# Patient Record
Sex: Female | Born: 1960
Health system: Southern US, Community
[De-identification: ages and names within clinical notes are randomized; demographics above are authoritative.]

## PROBLEM LIST (undated history)

## (undated) DIAGNOSIS — J189 Pneumonia, unspecified organism: Secondary | ICD-10-CM

## (undated) DIAGNOSIS — N390 Urinary tract infection, site not specified: Secondary | ICD-10-CM

## (undated) DIAGNOSIS — F419 Anxiety disorder, unspecified: Secondary | ICD-10-CM

## (undated) DIAGNOSIS — T8859XA Other complications of anesthesia, initial encounter: Secondary | ICD-10-CM

## (undated) DIAGNOSIS — F32A Depression, unspecified: Secondary | ICD-10-CM

## (undated) DIAGNOSIS — J45909 Unspecified asthma, uncomplicated: Secondary | ICD-10-CM

## (undated) DIAGNOSIS — K219 Gastro-esophageal reflux disease without esophagitis: Secondary | ICD-10-CM

## (undated) DIAGNOSIS — F329 Major depressive disorder, single episode, unspecified: Secondary | ICD-10-CM

## (undated) DIAGNOSIS — T4145XA Adverse effect of unspecified anesthetic, initial encounter: Secondary | ICD-10-CM

## (undated) DIAGNOSIS — K649 Unspecified hemorrhoids: Secondary | ICD-10-CM

## (undated) DIAGNOSIS — R011 Cardiac murmur, unspecified: Secondary | ICD-10-CM

## (undated) HISTORY — DX: Urinary tract infection, site not specified: N39.0

## (undated) HISTORY — DX: Unspecified asthma, uncomplicated: J45.909

## (undated) HISTORY — DX: Gastro-esophageal reflux disease without esophagitis: K21.9

## (undated) HISTORY — PX: WISDOM TOOTH EXTRACTION: SHX21

## (undated) HISTORY — DX: Cardiac murmur, unspecified: R01.1

## (undated) HISTORY — DX: Unspecified hemorrhoids: K64.9

## (undated) HISTORY — DX: Major depressive disorder, single episode, unspecified: F32.9

## (undated) HISTORY — DX: Depression, unspecified: F32.A

## (undated) HISTORY — PX: FRACTURE SURGERY: SHX138

## (undated) HISTORY — DX: Pneumonia, unspecified organism: J18.9

## (undated) HISTORY — DX: Anxiety disorder, unspecified: F41.9

---

## 1997-11-08 ENCOUNTER — Other Ambulatory Visit: Admission: RE | Admit: 1997-11-08 | Discharge: 1997-11-08 | Payer: Self-pay | Admitting: Obstetrics and Gynecology

## 1999-02-18 ENCOUNTER — Other Ambulatory Visit: Admission: RE | Admit: 1999-02-18 | Discharge: 1999-02-18 | Payer: Self-pay | Admitting: Obstetrics and Gynecology

## 2003-11-13 ENCOUNTER — Other Ambulatory Visit: Admission: RE | Admit: 2003-11-13 | Discharge: 2003-11-13 | Payer: Self-pay | Admitting: Internal Medicine

## 2005-05-20 ENCOUNTER — Other Ambulatory Visit: Admission: RE | Admit: 2005-05-20 | Discharge: 2005-05-20 | Payer: Self-pay | Admitting: Internal Medicine

## 2005-06-13 ENCOUNTER — Encounter: Admission: RE | Admit: 2005-06-13 | Discharge: 2005-06-13 | Payer: Self-pay | Admitting: Internal Medicine

## 2006-05-12 ENCOUNTER — Ambulatory Visit: Payer: Self-pay | Admitting: Family Medicine

## 2006-05-12 ENCOUNTER — Encounter: Payer: Self-pay | Admitting: Family Medicine

## 2006-05-12 ENCOUNTER — Other Ambulatory Visit: Admission: RE | Admit: 2006-05-12 | Discharge: 2006-05-12 | Payer: Self-pay | Admitting: Family Medicine

## 2006-05-22 ENCOUNTER — Ambulatory Visit: Payer: Self-pay | Admitting: Family Medicine

## 2006-05-22 LAB — CONVERTED CEMR LAB
Albumin: 3.3 g/dL — ABNORMAL LOW (ref 3.5–5.2)
Alkaline Phosphatase: 56 units/L (ref 39–117)
BUN: 10 mg/dL (ref 6–23)
Basophils Absolute: 0.1 10*3/uL (ref 0.0–0.1)
CO2: 27 meq/L (ref 19–32)
Cholesterol: 223 mg/dL (ref 0–200)
GFR calc Af Amer: 101 mL/min
Hemoglobin: 13.1 g/dL (ref 12.0–15.0)
Lymphocytes Relative: 26 % (ref 12.0–46.0)
MCHC: 34.4 g/dL (ref 30.0–36.0)
MCV: 82.4 fL (ref 78.0–100.0)
Monocytes Absolute: 0.6 10*3/uL (ref 0.2–0.7)
Monocytes Relative: 7.4 % (ref 3.0–11.0)
Neutro Abs: 5.4 10*3/uL (ref 1.4–7.7)
Potassium: 4.5 meq/L (ref 3.5–5.1)
TSH: 1.27 microintl units/mL (ref 0.35–5.50)
Total Protein: 6.7 g/dL (ref 6.0–8.3)

## 2006-06-10 DIAGNOSIS — F329 Major depressive disorder, single episode, unspecified: Secondary | ICD-10-CM | POA: Insufficient documentation

## 2006-06-10 DIAGNOSIS — F411 Generalized anxiety disorder: Secondary | ICD-10-CM | POA: Insufficient documentation

## 2006-06-10 DIAGNOSIS — N946 Dysmenorrhea, unspecified: Secondary | ICD-10-CM | POA: Insufficient documentation

## 2006-10-28 ENCOUNTER — Telehealth (INDEPENDENT_AMBULATORY_CARE_PROVIDER_SITE_OTHER): Payer: Self-pay | Admitting: *Deleted

## 2007-03-03 ENCOUNTER — Ambulatory Visit: Payer: Self-pay | Admitting: Family Medicine

## 2007-03-03 DIAGNOSIS — R0989 Other specified symptoms and signs involving the circulatory and respiratory systems: Secondary | ICD-10-CM | POA: Insufficient documentation

## 2007-03-03 DIAGNOSIS — L723 Sebaceous cyst: Secondary | ICD-10-CM | POA: Insufficient documentation

## 2007-03-03 DIAGNOSIS — R0609 Other forms of dyspnea: Secondary | ICD-10-CM | POA: Insufficient documentation

## 2007-03-04 ENCOUNTER — Encounter (INDEPENDENT_AMBULATORY_CARE_PROVIDER_SITE_OTHER): Payer: Self-pay | Admitting: *Deleted

## 2007-09-21 ENCOUNTER — Other Ambulatory Visit: Admission: RE | Admit: 2007-09-21 | Discharge: 2007-09-21 | Payer: Self-pay | Admitting: Family Medicine

## 2007-09-21 ENCOUNTER — Encounter: Payer: Self-pay | Admitting: Family Medicine

## 2007-09-21 ENCOUNTER — Encounter (INDEPENDENT_AMBULATORY_CARE_PROVIDER_SITE_OTHER): Payer: Self-pay | Admitting: *Deleted

## 2007-09-21 ENCOUNTER — Ambulatory Visit: Payer: Self-pay | Admitting: Family Medicine

## 2007-09-21 DIAGNOSIS — K921 Melena: Secondary | ICD-10-CM | POA: Insufficient documentation

## 2007-09-23 ENCOUNTER — Encounter (INDEPENDENT_AMBULATORY_CARE_PROVIDER_SITE_OTHER): Payer: Self-pay | Admitting: *Deleted

## 2007-09-24 ENCOUNTER — Encounter (INDEPENDENT_AMBULATORY_CARE_PROVIDER_SITE_OTHER): Payer: Self-pay | Admitting: *Deleted

## 2007-09-28 ENCOUNTER — Encounter (INDEPENDENT_AMBULATORY_CARE_PROVIDER_SITE_OTHER): Payer: Self-pay | Admitting: *Deleted

## 2007-09-28 ENCOUNTER — Ambulatory Visit: Payer: Self-pay | Admitting: Family Medicine

## 2007-09-28 LAB — CONVERTED CEMR LAB
OCCULT 1: NEGATIVE
OCCULT 2: NEGATIVE

## 2007-09-30 ENCOUNTER — Telehealth (INDEPENDENT_AMBULATORY_CARE_PROVIDER_SITE_OTHER): Payer: Self-pay | Admitting: *Deleted

## 2007-10-05 ENCOUNTER — Encounter: Admission: RE | Admit: 2007-10-05 | Discharge: 2007-10-05 | Payer: Self-pay | Admitting: Family Medicine

## 2007-10-12 ENCOUNTER — Encounter: Admission: RE | Admit: 2007-10-12 | Discharge: 2007-10-12 | Payer: Self-pay | Admitting: Family Medicine

## 2007-10-13 ENCOUNTER — Encounter: Payer: Self-pay | Admitting: Family Medicine

## 2007-10-18 ENCOUNTER — Encounter (INDEPENDENT_AMBULATORY_CARE_PROVIDER_SITE_OTHER): Payer: Self-pay | Admitting: *Deleted

## 2008-03-24 ENCOUNTER — Ambulatory Visit: Payer: Self-pay | Admitting: Family Medicine

## 2008-03-24 DIAGNOSIS — R05 Cough: Secondary | ICD-10-CM

## 2008-03-24 DIAGNOSIS — R059 Cough, unspecified: Secondary | ICD-10-CM | POA: Insufficient documentation

## 2008-08-15 ENCOUNTER — Other Ambulatory Visit: Admission: RE | Admit: 2008-08-15 | Discharge: 2008-08-15 | Payer: Self-pay | Admitting: Family Medicine

## 2008-08-15 ENCOUNTER — Ambulatory Visit: Payer: Self-pay | Admitting: Family Medicine

## 2008-08-15 ENCOUNTER — Encounter: Payer: Self-pay | Admitting: Family Medicine

## 2008-08-15 DIAGNOSIS — M722 Plantar fascial fibromatosis: Secondary | ICD-10-CM | POA: Insufficient documentation

## 2008-08-15 DIAGNOSIS — G43009 Migraine without aura, not intractable, without status migrainosus: Secondary | ICD-10-CM | POA: Insufficient documentation

## 2008-08-22 ENCOUNTER — Encounter (INDEPENDENT_AMBULATORY_CARE_PROVIDER_SITE_OTHER): Payer: Self-pay | Admitting: *Deleted

## 2008-08-23 ENCOUNTER — Ambulatory Visit: Payer: Self-pay | Admitting: Family Medicine

## 2008-08-23 LAB — CONVERTED CEMR LAB
Nitrite: NEGATIVE
Protein, U semiquant: NEGATIVE
Urobilinogen, UA: 0.2

## 2008-08-28 ENCOUNTER — Encounter (INDEPENDENT_AMBULATORY_CARE_PROVIDER_SITE_OTHER): Payer: Self-pay | Admitting: *Deleted

## 2008-08-28 LAB — CONVERTED CEMR LAB
ALT: 15 units/L (ref 0–35)
AST: 14 units/L (ref 0–37)
Alkaline Phosphatase: 56 units/L (ref 39–117)
Basophils Relative: 0.6 % (ref 0.0–3.0)
Bilirubin, Direct: 0.1 mg/dL (ref 0.0–0.3)
Chloride: 109 meq/L (ref 96–112)
Creatinine, Ser: 0.7 mg/dL (ref 0.4–1.2)
Eosinophils Relative: 1.9 % (ref 0.0–5.0)
GFR calc non Af Amer: 95.91 mL/min (ref >60)
LDL Cholesterol: 94 mg/dL (ref 0–99)
MCV: 85.3 fL (ref 78.0–100.0)
Monocytes Absolute: 0.5 10*3/uL (ref 0.1–1.0)
Monocytes Relative: 5.5 % (ref 3.0–12.0)
Neutrophils Relative %: 66.6 % (ref 43.0–77.0)
Platelets: 313 10*3/uL (ref 150.0–400.0)
Potassium: 4.3 meq/L (ref 3.5–5.1)
RBC: 4.36 M/uL (ref 3.87–5.11)
Total Bilirubin: 0.5 mg/dL (ref 0.3–1.2)
Total CHOL/HDL Ratio: 4
Total Protein: 6.5 g/dL (ref 6.0–8.3)
Triglycerides: 116 mg/dL (ref 0.0–149.0)
VLDL: 23.2 mg/dL (ref 0.0–40.0)
WBC: 9.9 10*3/uL (ref 4.5–10.5)

## 2008-08-30 ENCOUNTER — Ambulatory Visit: Payer: Self-pay | Admitting: Family Medicine

## 2008-08-30 DIAGNOSIS — D239 Other benign neoplasm of skin, unspecified: Secondary | ICD-10-CM | POA: Insufficient documentation

## 2008-09-01 ENCOUNTER — Telehealth (INDEPENDENT_AMBULATORY_CARE_PROVIDER_SITE_OTHER): Payer: Self-pay | Admitting: *Deleted

## 2008-09-07 ENCOUNTER — Ambulatory Visit: Payer: Self-pay | Admitting: Family Medicine

## 2008-09-14 ENCOUNTER — Encounter: Payer: Self-pay | Admitting: Family Medicine

## 2008-11-08 ENCOUNTER — Encounter: Admission: RE | Admit: 2008-11-08 | Discharge: 2008-11-08 | Payer: Self-pay | Admitting: Family Medicine

## 2008-11-30 ENCOUNTER — Ambulatory Visit: Payer: Self-pay | Admitting: Family Medicine

## 2008-11-30 DIAGNOSIS — J019 Acute sinusitis, unspecified: Secondary | ICD-10-CM | POA: Insufficient documentation

## 2009-01-15 ENCOUNTER — Ambulatory Visit: Payer: Self-pay | Admitting: Family Medicine

## 2009-03-15 ENCOUNTER — Ambulatory Visit: Payer: Self-pay | Admitting: Family Medicine

## 2009-03-15 DIAGNOSIS — H6690 Otitis media, unspecified, unspecified ear: Secondary | ICD-10-CM | POA: Insufficient documentation

## 2010-01-15 ENCOUNTER — Encounter: Payer: Self-pay | Admitting: Family Medicine

## 2010-01-15 ENCOUNTER — Other Ambulatory Visit
Admission: RE | Admit: 2010-01-15 | Discharge: 2010-01-15 | Payer: Self-pay | Source: Home / Self Care | Admitting: Family Medicine

## 2010-01-15 ENCOUNTER — Ambulatory Visit
Admission: RE | Admit: 2010-01-15 | Discharge: 2010-01-15 | Payer: Self-pay | Source: Home / Self Care | Attending: Family Medicine | Admitting: Family Medicine

## 2010-01-15 ENCOUNTER — Other Ambulatory Visit: Payer: Self-pay | Admitting: Family Medicine

## 2010-01-15 LAB — CBC WITH DIFFERENTIAL/PLATELET
Basophils Absolute: 0.1 10*3/uL (ref 0.0–0.1)
Basophils Relative: 0.7 % (ref 0.0–3.0)
Eosinophils Absolute: 0.2 10*3/uL (ref 0.0–0.7)
Eosinophils Relative: 1.7 % (ref 0.0–5.0)
HCT: 38.2 % (ref 36.0–46.0)
Hemoglobin: 12.9 g/dL (ref 12.0–15.0)
Lymphocytes Relative: 22 % (ref 12.0–46.0)
Lymphs Abs: 2.2 10*3/uL (ref 0.7–4.0)
MCHC: 33.7 g/dL (ref 30.0–36.0)
MCV: 85.1 fl (ref 78.0–100.0)
Monocytes Absolute: 0.6 10*3/uL (ref 0.1–1.0)
Monocytes Relative: 5.8 % (ref 3.0–12.0)
Neutro Abs: 6.9 10*3/uL (ref 1.4–7.7)
Neutrophils Relative %: 69.8 % (ref 43.0–77.0)
Platelets: 290 10*3/uL (ref 150.0–400.0)
RBC: 4.49 Mil/uL (ref 3.87–5.11)
RDW: 13.8 % (ref 11.5–14.6)
WBC: 9.8 10*3/uL (ref 4.5–10.5)

## 2010-01-15 LAB — HEPATIC FUNCTION PANEL
ALT: 14 U/L (ref 0–35)
AST: 14 U/L (ref 0–37)
Albumin: 3.3 g/dL — ABNORMAL LOW (ref 3.5–5.2)
Alkaline Phosphatase: 50 U/L (ref 39–117)
Bilirubin, Direct: 0.1 mg/dL (ref 0.0–0.3)
Total Bilirubin: 0.6 mg/dL (ref 0.3–1.2)
Total Protein: 6.6 g/dL (ref 6.0–8.3)

## 2010-01-15 LAB — LIPID PANEL
Cholesterol: 201 mg/dL — ABNORMAL HIGH (ref 0–200)
HDL: 41.2 mg/dL (ref >39.00)
Total CHOL/HDL Ratio: 5
Triglycerides: 152 mg/dL — ABNORMAL HIGH (ref 0.0–149.0)
VLDL: 30.4 mg/dL (ref 0.0–40.0)

## 2010-01-15 LAB — CONVERTED CEMR LAB
Bilirubin Urine: NEGATIVE
Glucose, Urine, Semiquant: NEGATIVE
Ketones, urine, test strip: NEGATIVE
Specific Gravity, Urine: 1.02
pH: 6.5

## 2010-01-15 LAB — BASIC METABOLIC PANEL
BUN: 8 mg/dL (ref 6–23)
CO2: 25 mEq/L (ref 19–32)
Calcium: 8.7 mg/dL (ref 8.4–10.5)
Chloride: 103 mEq/L (ref 96–112)
Creatinine, Ser: 0.6 mg/dL (ref 0.4–1.2)
GFR: 107.65 mL/min (ref >60.00)
Glucose, Bld: 78 mg/dL (ref 70–99)
Potassium: 4.2 mEq/L (ref 3.5–5.1)
Sodium: 136 mEq/L (ref 135–145)

## 2010-01-15 LAB — LDL CHOLESTEROL, DIRECT: Direct LDL: 141.8 mg/dL

## 2010-01-15 LAB — TSH: TSH: 1.25 u[IU]/mL (ref 0.35–5.50)

## 2010-02-03 ENCOUNTER — Encounter: Payer: Self-pay | Admitting: Internal Medicine

## 2010-02-04 ENCOUNTER — Encounter: Payer: Self-pay | Admitting: Family Medicine

## 2010-02-10 LAB — CONVERTED CEMR LAB
Albumin: 3.5 g/dL (ref 3.5–5.2)
BUN: 5 mg/dL — ABNORMAL LOW (ref 6–23)
Basophils Absolute: 0.1 10*3/uL (ref 0.0–0.1)
Basophils Relative: 1 % (ref 0.0–3.0)
Calcium: 8.7 mg/dL (ref 8.4–10.5)
Cholesterol: 184 mg/dL (ref 0–200)
Creatinine, Ser: 0.8 mg/dL (ref 0.4–1.2)
Eosinophils Absolute: 0.2 10*3/uL (ref 0.0–0.7)
GFR calc Af Amer: 100 mL/min
Glucose, Bld: 93 mg/dL (ref 70–99)
MCHC: 34.5 g/dL (ref 30.0–36.0)
MCV: 83 fL (ref 78.0–100.0)
Monocytes Absolute: 0.7 10*3/uL (ref 0.1–1.0)
Neutro Abs: 6.6 10*3/uL (ref 1.4–7.7)
Neutrophils Relative %: 66.7 % (ref 43.0–77.0)
RBC: 4.48 M/uL (ref 3.87–5.11)
Total Protein: 6.8 g/dL (ref 6.0–8.3)
VLDL: 29 mg/dL (ref 0–40)
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
## 2010-02-14 NOTE — Assessment & Plan Note (Signed)
Summary: CPX,PAP,FASTING,BC[employer] INS/RH......   Vital Signs:  Patient profile:   50 year old female Menstrual status:  regular LMP:     12/28/2009 Height:      69 inches Weight:      258.2 pounds BMI:     38.27 Temp:     98.6 degrees F oral Pulse rate:   84 / minute Pulse rhythm:   regular BP sitting:   132 / 80  (right arm) Cuff size:   large  Vitals Entered By: Almeta Monas CMA Duncan Dull) (January 15, 2010 9:00 AM) CC: CPX/Fasting---pap needed-- c/o bilateral hand numbness LMP (date): 12/28/2009     Menstrual Status regular Enter LMP: 12/28/2009 Last PAP Result NEGATIVE FOR INTRAEPITHELIAL LESIONS OR MALIGNANCY.   History of Present Illness: Pt here for cpe,  pap and labs.    Preventive Screening-Counseling & Management  Alcohol-Tobacco     Alcohol drinks/day: <1     Alcohol type: spirits     Smoking Status: never     Passive Smoke Exposure: yes     Passive Smoke Counseling: to avoid passive smoke exposure  Caffeine-Diet-Exercise     Caffeine use/day: 1     Diet Comments: see HPI     Diet Counseling: to improve diet; diet is suboptimal     Nutrition Referrals: yes     Does Patient Exercise: no     Exercise Counseling: to improve exercise regimen  Hep-HIV-STD-Contraception     HIV Risk: no     Dental Visit-last 6 months no     Dental Care Counseling: to seek dental care; no dental care within six months     SBE monthly: yes     SBE Education/Counseling: not indicated; SBE done regularly     Sun Exposure-Excessive: no     Sun Exposure Counseling: to decrease sun exposure  Safety-Violence-Falls     Seat Belt Use: yes     Firearms in the Home: no firearms in the home      Sexual History:  single and not sexually active.        Drug Use:  never.        Blood Transfusions:  no.    Problems Prior to Update: 1)  Rom  (ICD-382.9) 2)  Sinusitis - Acute-nos  (ICD-461.9) 3)  Nevi, Multiple  (ICD-216.9) 4)  Common Migraine  (ICD-346.10) 5)  Common Migraine   (ICD-346.10) 6)  Morbid Obesity  (ICD-278.01) 7)  Plantar Fasciitis, Right  (ICD-728.71) 8)  Cough  (ICD-786.2) 9)  Guaiac Positive Stool  (ICD-578.1) 10)  Preventive Health Care  (ICD-V70.0) 11)  Family History of Alcoholism/addiction  (ICD-V61.41) 12)  Family History Diabetes 1st Degree Relative  (ICD-V18.0) 13)  Family History Breast Cancer 1st Degree Relative <50  (ICD-V16.3) 14)  Snoring  (ICD-786.09) 15)  Sebaceous Cyst  (ICD-706.2) 16)  Dysmenorrhea  (ICD-625.3) 17)  Depression  (ICD-311) 18)  Anxiety  (ICD-300.00)  Medications Prior to Update: 1)  Sprintec 28 0.25-35 Mg-Mcg Tabs (Norgestimate-Eth Estradiol) .... As Directed 2)  Ambien 10 Mg  Tabs (Zolpidem Tartrate) .Marland Kitchen.. 1 By Mouth At Bedtime As Needed 3)  Alprazolam 0.25 Mg  Tabs (Alprazolam) .... As Needed 4)  Celexa 20 Mg Tabs (Citalopram Hydrobromide) .Marland Kitchen.. 1 By Mouth Once Daily 5)  Dulera 100-5 Mcg/act Aero (Mometasone Furo-Formoterol Fum) .... 2 Puffs Two Times A Day 6)  Topamax 50 Mg Tabs (Topiramate) .Marland Kitchen.. 1 By Mouth Two Times A Day 7)  Nasacort Aq 55 Mcg/act Aers (Triamcinolone Acetonide(Nasal)) .Marland KitchenMarland KitchenMarland Kitchen  2 Sprays Each Nostril Once Daily  Current Medications (verified): 1)  Sprintec 28 0.25-35 Mg-Mcg Tabs (Norgestimate-Eth Estradiol) .... As Directed 2)  Ambien 10 Mg  Tabs (Zolpidem Tartrate) .Marland Kitchen.. 1 By Mouth At Bedtime As Needed 3)  Alprazolam 0.25 Mg  Tabs (Alprazolam) .... As Needed 4)  Celexa 20 Mg Tabs (Citalopram Hydrobromide) .Marland Kitchen.. 1 By Mouth Once Daily 5)  Dulera 100-5 Mcg/act Aero (Mometasone Furo-Formoterol Fum) .... 2 Puffs Two Times A Day 6)  Topamax 50 Mg Tabs (Topiramate) .Marland Kitchen.. 1 By Mouth Two Times A Day 7)  Nasacort Aq 55 Mcg/act Aers (Triamcinolone Acetonide(Nasal)) .... 2 Sprays Each Nostril Once Daily 8)  Daypro 600 Mg Tabs (Oxaprozin) .Marland Kitchen.. 1 By Mouth Two Times A Day  Allergies (verified): No Known Drug Allergies  Past History:  Past Medical History: Last updated:  08/15/2008 Anxiety Depression Current Problems:  COMMON MIGRAINE (ICD-346.10) MORBID OBESITY (ICD-278.01) PLANTAR FASCIITIS, RIGHT (ICD-728.71) COUGH (ICD-786.2) GUAIAC POSITIVE STOOL (ICD-578.1) PREVENTIVE HEALTH CARE (ICD-V70.0) FAMILY HISTORY OF ALCOHOLISM/ADDICTION (ICD-V61.41) FAMILY HISTORY DIABETES 1ST DEGREE RELATIVE (ICD-V18.0) FAMILY HISTORY BREAST CANCER 1ST DEGREE RELATIVE <50 (ICD-V16.3) SNORING (ICD-786.09) SEBACEOUS CYST (ICD-706.2) DYSMENORRHEA (ICD-625.3) DEPRESSION (ICD-311) ANXIETY (ICD-300.00) Migraines  Past Surgical History: Last updated: 09/21/2007 Denies surgical history  Family History: Last updated: 01/15/2010 Family History High cholesterol Family History Hypertension Family History Lung cancer Family History Liver disease Family History Breast cancer Family History Diabetes Family History of Alcoholism/Addiction Family History Other cancer-- B--pancreatic CA Maunt--dementia  Social History: Last updated: 09/21/2007 Occupation: [employer]ing Never Smoked Single Alcohol use-yes Drug use-no Regular exercise-no  Risk Factors: Alcohol Use: <1 (01/15/2010) Caffeine Use: 1 (01/15/2010) Diet: see HPI (01/15/2010) Exercise: no (01/15/2010)  Risk Factors: Smoking Status: never (01/15/2010) Passive Smoke Exposure: yes (01/15/2010)  Family History: Reviewed history from 08/15/2008 and no changes required. Family History High cholesterol Family History Hypertension Family History Lung cancer Family History Liver disease Family History Breast cancer Family History Diabetes Family History of Alcoholism/Addiction Family History Other cancer-- B--pancreatic CA Maunt--dementia  Social History: Reviewed history from 09/21/2007 and no changes required. Occupation: [employer]ing Never Smoked Single Alcohol use-yes Drug use-no Regular exercise-no  Review of Systems      See HPI General:  Denies chills, fatigue, fever, loss of appetite,  malaise, sleep disorder, sweats, weakness, and weight loss. Eyes:  Denies blurring, discharge, double vision, eye irritation, eye pain, halos, itching, light sensitivity, red eye, vision loss-1 eye, and vision loss-both eyes; optho--due. ENT:  Denies decreased hearing, difficulty swallowing, ear discharge, earache, hoarseness, nasal congestion, nosebleeds, postnasal drainage, ringing in ears, sinus pressure, and sore throat. CV:  Denies bluish discoloration of lips or nails, chest pain or discomfort, difficulty breathing at night, difficulty breathing while lying down, fainting, fatigue, leg cramps with exertion, lightheadness, near fainting, palpitations, shortness of breath with exertion, swelling of feet, swelling of hands, and weight gain. Resp:  Denies chest discomfort, chest pain with inspiration, cough, coughing up blood, excessive snoring, hypersomnolence, morning headaches, pleuritic, shortness of breath, sputum productive, and wheezing. GI:  Denies abdominal pain, bloody stools, change in bowel habits, constipation, dark tarry stools, diarrhea, excessive appetite, gas, hemorrhoids, indigestion, loss of appetite, nausea, vomiting, vomiting blood, and yellowish skin color. GU:  Denies abnormal vaginal bleeding, decreased libido, discharge, dysuria, genital sores, hematuria, incontinence, nocturia, urinary frequency, and urinary hesitancy. MS:  Denies joint pain, joint redness, joint swelling, loss of strength, low back pain, mid back pain, muscle aches, muscle , cramps, muscle weakness, stiffness, and thoracic pain. Derm:  Denies changes in  color of skin, changes in nail beds, dryness, excessive perspiration, flushing, hair loss, insect bite(s), itching, lesion(s), poor wound healing, and rash. Neuro:  Complains of numbness; denies brief paralysis, difficulty with concentration, disturbances in coordination, falling down, headaches, inability to speak, memory loss, poor balance, seizures, sensation  of room spinning, tingling, tremors, visual disturbances, and weakness; Pt always has trouble this time of year secondary to being busy at work and it usually goes away Jan / Feb. Psych:  Denies alternate hallucination ( auditory/visual), anxiety, depression, easily angered, easily tearful, irritability, mental problems, panic attacks, sense of great danger, suicidal thoughts/plans, thoughts of violence, unusual visions or sounds, and thoughts /plans of harming others. Endo:  Denies cold intolerance, excessive hunger, excessive thirst, excessive urination, heat intolerance, polyuria, and weight change. Heme:  Denies abnormal bruising, bleeding, enlarge lymph nodes, fevers, pallor, and skin discoloration. Allergy:  Denies hives or rash, itching eyes, persistent infections, seasonal allergies, and sneezing.  Physical Exam  General:  Well-developed,well-nourished,in no acute distress; alert,appropriate and cooperative throughout examination Head:  Normocephalic and atraumatic without obvious abnormalities. No apparent alopecia or balding. Eyes:  pupils equal, pupils round, pupils reactive to light, and no injection.   Ears:  External ear exam shows no significant lesions or deformities.  Otoscopic examination reveals clear canals, tympanic membranes are intact bilaterally without bulging, retraction, inflammation or discharge. Hearing is grossly normal bilaterally. Nose:  External nasal examination shows no deformity or inflammation. Nasal mucosa are pink and moist without lesions or exudates. Mouth:  Oral mucosa and oropharynx without lesions or exudates.  Teeth in good repair. Neck:  No deformities, masses, or tenderness noted. Chest Wall:  No deformities, masses, or tenderness noted. Breasts:  No mass, nodules, thickening, tenderness, bulging, retraction, inflamation, nipple discharge or skin changes noted.   Lungs:  Normal respiratory effort, chest expands symmetrically. Lungs are clear to  auscultation, no crackles or wheezes. Heart:  normal ratenormal rate and Grade  2 /6 systolic ejection murmur.   Abdomen:  Bowel sounds positive,abdomen soft and non-tender without masses, organomegaly or hernias noted. Rectal:  No external abnormalities noted. Normal sphincter tone. No rectal masses or tenderness.  heme negative brown stool Genitalia:  Pelvic Exam:        External: normal female genitalia without lesions or masses        Vagina: normal without lesions or masses        Cervix: normal without lesions or masses        Adnexa: normal bimanual exam without masses or fullness        Uterus: normal by palpation        Pap smear: performed Msk:  normal ROM, no joint tenderness, no joint swelling, no joint warmth, no redness over joints, no joint deformities, no joint instability, and no crepitation.   Pulses:  R and L carotid,radial,femoral,dorsalis pedis and posterior tibial pulses are full and equal bilaterally Extremities:  No clubbing, cyanosis, edema, or deformity noted with normal full range of motion of all joints.   Neurologic:  No cranial nerve deficits noted. Station and gait are normal. Plantar reflexes are down-going bilaterally. DTRs are symmetrical throughout. Sensory, motor and coordinative functions appear intact. Skin:  Intact without suspicious lesions or rashes Cervical Nodes:  No lymphadenopathy noted Axillary Nodes:  No palpable lymphadenopathy Psych:  Cognition and judgment appear intact. Alert and cooperative with normal attention span and concentration. No apparent delusions, illusions, hallucinations   Impression & Recommendations:  Problem # 1:  PREVENTIVE HEALTH  CARE (ICD-V70.0) ghm utd  Orders: Venipuncture ([address]) TLB-Lipid Panel (80061-LIPID) TLB-BMP (Basic Metabolic Panel-BMET) (80048-METABOL) TLB-CBC Platelet - w/Differential (85025-CBCD) TLB-Hepatic/Liver Function Pnl (80076-HEPATIC) TLB-TSH (Thyroid Stimulating Hormone) (84443-TSH) Specimen  Handling (09811) UA Dipstick W/ Micro (manual) (81000) EKG w/ Interpretation (93000)  Complete Medication List: 1)  Sprintec 28 0.25-35 Mg-mcg Tabs (Norgestimate-eth estradiol) .... As directed 2)  Ambien 10 Mg Tabs (Zolpidem tartrate) .Marland Kitchen.. 1 by mouth at bedtime as needed 3)  Alprazolam 0.25 Mg Tabs (Alprazolam) .... As needed 4)  Celexa 20 Mg Tabs (Citalopram hydrobromide) .Marland Kitchen.. 1 by mouth once daily 5)  Dulera 100-5 Mcg/act Aero (Mometasone furo-formoterol fum) .... 2 puffs two times a day 6)  Topamax 50 Mg Tabs (Topiramate) .Marland Kitchen.. 1 by mouth two times a day 7)  Nasacort Aq 55 Mcg/act Aers (Triamcinolone acetonide(nasal)) .... 2 sprays each nostril once daily 8)  Daypro 600 Mg Tabs (Oxaprozin) .Marland Kitchen.. 1 by mouth two times a day Prescriptions: ALPRAZOLAM 0.25 MG  TABS (ALPRAZOLAM) as needed  #30 x 0   Entered and Authorized by:   Loreen Freud DO   Signed by:   Loreen Freud DO on 01/15/2010   Method used:   Print then Give to Patient   RxID:   [id number] AMBIEN 10 MG  TABS (ZOLPIDEM TARTRATE) 1 by mouth at bedtime as needed  #30 x 0   Entered and Authorized by:   Loreen Freud DO   Signed by:   Loreen Freud DO on 01/15/2010   Method used:   Print then Give to Patient   RxID:   [id number] DAYPRO 600 MG TABS (OXAPROZIN) 1 by mouth two times a day  #60 x 2   Entered and Authorized by:   Loreen Freud DO   Signed by:   Loreen Freud DO on 01/15/2010   Method used:   Electronically to        [facility] [phone]* (retail)       [address]       [place], [place]  [address]       Ph: [phone]       Fax: [phone]   RxID:   [id number] NASACORT AQ 55 MCG/ACT AERS (TRIAMCINOLONE ACETONIDE(NASAL)) 2 sprays each nostril once daily  #1 x 5   Entered and Authorized by:   Loreen Freud DO   Signed by:   Loreen Freud DO on 01/15/2010   Method used:   Electronically to        [facility] [phone]* (retail)       [address]       [place], [place]  [address]       Ph:  [phone]       Fax: [phone]   RxID:   [id number] DULERA 100-5 MCG/ACT AERO (MOMETASONE FURO-FORMOTEROL FUM) 2 puffs two times a day  #1 x 5   Entered and Authorized by:   Loreen Freud DO   Signed by:   Loreen Freud DO on 01/15/2010   Method used:   Electronically to        [facility] [phone]* (retail)       [address]       [place], [place]  [address]       Ph: [phone]       Fax: [phone]   RxID:   [id number] TOPAMAX 50 MG TABS (TOPIRAMATE) 1 by mouth two times a day  #60 x 11   Entered and Authorized by:  Loreen Freud DO   Signed by:   Loreen Freud DO on 01/15/2010   Method used:   Electronically to        [facility] [phone]* (retail)       [address]       [place], [place]  [address]       Ph: [phone]       Fax: [phone]   RxID:   [id number] CELEXA 20 MG TABS (CITALOPRAM HYDROBROMIDE) 1 by mouth once daily  #30 Tablet x 11   Entered and Authorized by:   Loreen Freud DO   Signed by:   Loreen Freud DO on 01/15/2010   Method used:   Electronically to        [facility] [phone]* (retail)       [address]       [place], [place]  [address]       Ph: [phone]       Fax: [phone]   RxID:   [id number] SPRINTEC 28 0.25-35 MG-MCG TABS (NORGESTIMATE-ETH ESTRADIOL) as directed  #1 x 11   Entered and Authorized by:   Loreen Freud DO   Signed by:   Loreen Freud DO on 01/15/2010   Method used:   Electronically to        [facility] [phone]* (retail)       [address]       [place], [place]  [address]       Ph: [phone]       Fax: [phone]   RxID:   [id number]    Orders Added: 1)  Venipuncture [09381] 2)  TLB-Lipid Panel [80061-LIPID] 3)  TLB-BMP (Basic Metabolic Panel-BMET) [80048-METABOL] 4)  TLB-CBC Platelet - w/Differential [85025-CBCD] 5)  TLB-Hepatic/Liver Function Pnl [80076-HEPATIC] 6)  TLB-TSH (Thyroid Stimulating Hormone) [84443-TSH] 7)  Specimen Handling [99000] 8)  UA Dipstick W/ Micro  (manual) [81000] 9)  Est. Patient 40-64 years [99396] 10)  EKG w/ Interpretation [93000]    Last Flu Vaccine:  Fluvax 3+ (10/20/2007 9:23:42 AM) Flu Vaccine Result Date:  12/13/2009 Flu Vaccine Result:  given Flu Vaccine Next Due:  1 yr    Laboratory Results   Urine Tests   Date/Time Reported: January 15, 2010 9:52 AM   Routine Urinalysis   Appearance: Clear Glucose: negative   (Normal Range: Negative) Bilirubin: negative   (Normal Range: Negative) Ketone: negative   (Normal Range: Negative) Spec. Gravity: 1.020   (Normal Range: 1.003-1.035) Blood: large   (Normal Range: Negative) pH: 6.5   (Normal Range: 5.0-8.0) Protein: negative   (Normal Range: Negative) Urobilinogen: negative   (Normal Range: 0-1) Nitrite: negative   (Normal Range: Negative) Leukocyte Esterace: negative   (Normal Range: Negative)    Comments: Floydene Flock  January 15, 2010 9:53 AM did not send cx..due to start menses per Kim/ pap done

## 2010-02-14 NOTE — Assessment & Plan Note (Signed)
Summary: ear infection?/kdc   Vital Signs:  Patient profile:   50 year old female Weight:      251 pounds Temp:     98.2 degrees F oral Pulse rate:   86 / minute Pulse rhythm:   regular BP sitting:   122 / 80  (left arm) Cuff size:   large  Vitals Entered By: Army Fossa CMA (March 15, 2009 11:29 AM) CC: Pt c/o ear ache, pressure, pounding, right ear mostly some left, pt has been swimming a lot. , Ear pain   History of Present Illness:  Ear Pain      This is a 50 year old woman who presents with Ear pain.  Pt here c/o R ear pain.  She has been swimming but she states it does not feel like swimmers ear.  Pt is flying out of town Saturday and is concerned about flight.  The patient denies ear discharge, sensation of fullness, hearing loss, tinnitus, fever, sinus pain, nasal discharge, and jaw click.  The pain is located in the right ear.  The pain is described as constant.  The patient denies headache, night grinding of teeth, popping or crackling sounds, pressure, toothache, dizziness, and vertigo.    Preventive Screening-Counseling & Management  Alcohol-Tobacco     Alcohol drinks/day: <1     Alcohol type: spirits     Smoking Status: never     Passive Smoke Exposure: yes     Passive Smoke Counseling: to avoid passive smoke exposure  Caffeine-Diet-Exercise     Caffeine use/day: 1     Diet Comments: see HPI     Diet Counseling: to improve diet; diet is suboptimal     Nutrition Referrals: yes     Does Patient Exercise: no     Exercise Counseling: to improve exercise regimen  Current Medications (verified): 1)  Sprintec 28 0.25-35 Mg-Mcg Tabs (Norgestimate-Eth Estradiol) .... As Directed 2)  Ambien 10 Mg  Tabs (Zolpidem Tartrate) .Marland Kitchen.. 1 By Mouth At Bedtime As Needed 3)  Alprazolam 0.25 Mg  Tabs (Alprazolam) .... As Needed 4)  Celexa 20 Mg Tabs (Citalopram Hydrobromide) .Marland Kitchen.. 1 By Mouth Once Daily 5)  Dulera 100-5 Mcg/act Aero (Mometasone Furo-Formoterol Fum) .... 2 Puffs Two  Times A Day 6)  Topamax 50 Mg Tabs (Topiramate) .Marland Kitchen.. 1 By Mouth Two Times A Day 7)  Nasacort Aq 55 Mcg/act Aers (Triamcinolone Acetonide(Nasal)) .... 2 Sprays Each Nostril Once Daily 8)  Ceftin 500 Mg Tabs (Cefuroxime Axetil) .Marland Kitchen.. 1 By Mouth Two Times A Day 9)  Floxin Otic Drops .Marland Kitchen.. 10 Gtts in R Ear Once Daily  Allergies (verified): No Known Drug Allergies  Past History:  Past medical, surgical, family and social histories (including risk factors) reviewed for relevance to current acute and chronic problems.  Past Medical History: Reviewed history from 08/15/2008 and no changes required. Anxiety Depression Current Problems:  COMMON MIGRAINE (ICD-346.10) MORBID OBESITY (ICD-278.01) PLANTAR FASCIITIS, RIGHT (ICD-728.71) COUGH (ICD-786.2) GUAIAC POSITIVE STOOL (ICD-578.1) PREVENTIVE HEALTH CARE (ICD-V70.0) FAMILY HISTORY OF ALCOHOLISM/ADDICTION (ICD-V61.41) FAMILY HISTORY DIABETES 1ST DEGREE RELATIVE (ICD-V18.0) FAMILY HISTORY BREAST CANCER 1ST DEGREE RELATIVE <50 (ICD-V16.3) SNORING (ICD-786.09) SEBACEOUS CYST (ICD-706.2) DYSMENORRHEA (ICD-625.3) DEPRESSION (ICD-311) ANXIETY (ICD-300.00) Migraines  Past Surgical History: Reviewed history from 09/21/2007 and no changes required. Denies surgical history  Family History: Reviewed history from 08/15/2008 and no changes required. Family History High cholesterol Family History Hypertension Family History Lung cancer Family History Liver disease Family History Breast cancer Family History Diabetes Family History of Alcoholism/Addiction  Family History Other cancer-- B--pancreatic CA  Social History: Reviewed history from 09/21/2007 and no changes required. Occupation: [employer] Never Smoked Single Alcohol use-yes Drug use-no Regular exercise-no  Review of Systems      See HPI  Physical Exam  General:  Well-developed,well-nourished,in no acute distress; alert,appropriate and cooperative throughout  examination Ears:  R tm red and dull R canal errythematous  Nose:  External nasal examination shows no deformity or inflammation. Nasal mucosa are pink and moist without lesions or exudates. Mouth:  Oral mucosa and oropharynx without lesions or exudates.  Teeth in good repair. Neck:  No deformities, masses, or tenderness noted. Lungs:  Normal respiratory effort, chest expands symmetrically. Lungs are clear to auscultation, no crackles or wheezes. Heart:  normal rate and no murmur.   Psych:  Cognition and judgment appear intact. Alert and cooperative with normal attention span and concentration. No apparent delusions, illusions, hallucinations   Impression & Recommendations:  Problem # 1:  ROM (ICD-382.9)  The following medications were removed from the medication list:    Zithromax Z-pak 250 Mg Tabs (Azithromycin) .Marland Kitchen... As directed Her updated medication list for this problem includes:    Ceftin 500 Mg Tabs (Cefuroxime axetil) .Marland Kitchen... 1 by mouth two times a day    floxin 10 gtts in ear once daily for 10 days   Instructed on prevention and treatment. Call if no improvement in 48-72 hours or sooner if worsening symptoms.   Orders: Admin of Therapeutic Inj  intramuscular or subcutaneous (57846) Rocephin  250mg  (N6295)  Complete Medication List: 1)  Sprintec 28 0.25-35 Mg-mcg Tabs (Norgestimate-eth estradiol) .... As directed 2)  Ambien 10 Mg Tabs (Zolpidem tartrate) .Marland Kitchen.. 1 by mouth at bedtime as needed 3)  Alprazolam 0.25 Mg Tabs (Alprazolam) .... As needed 4)  Celexa 20 Mg Tabs (Citalopram hydrobromide) .Marland Kitchen.. 1 by mouth once daily 5)  Dulera 100-5 Mcg/act Aero (Mometasone furo-formoterol fum) .... 2 puffs two times a day 6)  Topamax 50 Mg Tabs (Topiramate) .Marland Kitchen.. 1 by mouth two times a day 7)  Nasacort Aq 55 Mcg/act Aers (Triamcinolone acetonide(nasal)) .... 2 sprays each nostril once daily 8)  Ceftin 500 Mg Tabs (Cefuroxime axetil) .Marland Kitchen.. 1 by mouth two times a day 9)  Floxin Otic Drops   .Marland Kitchen.. 10 gtts in r ear once daily Prescriptions: FLOXIN OTIC DROPS 10 gtts in R ear once daily  #10 days x 0   Entered and Authorized by:   Loreen Freud DO   Signed by:   Loreen Freud DO on 03/15/2009   Method used:   Faxed to ...       [facility]  [address] [phone]* (retail)       [address]       [place], [place]  [address]       Ph: [phone]       Fax: [phone]   RxID:   [id number] CEFTIN 500 MG TABS (CEFUROXIME AXETIL) 1 by mouth two times a day  #20 x 0   Entered and Authorized by:   Loreen Freud DO   Signed by:   Loreen Freud DO on 03/15/2009   Method used:   Electronically to        [facility] [phone]* (retail)       [address]       [place], [place]  [address]       Ph: [phone]       Fax: [phone]   RxID:   [id number]  Medication Administration  Injection # 1:    Medication: Rocephin  250mg     Diagnosis: ROM (ICD-382.9)    Route: IM    Site: LUOQ gluteus    Exp Date: 11/2011    Lot #: [id number]    Mfr: [email or web address]    Comments: received 1 gm    Patient tolerated injection without complications    Given by: Army Fossa CMA (March 15, 2009 11:48 AM)  Orders Added: 1)  Est. Patient Level III [82956] 2)  Admin of Therapeutic Inj  intramuscular or subcutaneous [96372] 3)  Rocephin  250mg  [J0696]

## 2010-04-02 ENCOUNTER — Other Ambulatory Visit: Payer: Self-pay | Admitting: Family Medicine

## 2010-04-02 DIAGNOSIS — Z1231 Encounter for screening mammogram for malignant neoplasm of breast: Secondary | ICD-10-CM

## 2010-04-24 ENCOUNTER — Ambulatory Visit
Admission: RE | Admit: 2010-04-24 | Discharge: 2010-04-24 | Disposition: A | Discharge status: Home / Self Care | Payer: BLUE CROSS/BLUE SHIELD | Source: Ambulatory Visit | Attending: Family Medicine | Admitting: Family Medicine

## 2010-04-24 DIAGNOSIS — Z1231 Encounter for screening mammogram for malignant neoplasm of breast: Secondary | ICD-10-CM

## 2010-08-19 ENCOUNTER — Other Ambulatory Visit: Payer: Self-pay | Admitting: Family Medicine

## 2010-11-07 ENCOUNTER — Other Ambulatory Visit: Payer: Self-pay | Admitting: Family Medicine

## 2011-02-05 ENCOUNTER — Other Ambulatory Visit: Payer: Self-pay | Admitting: Family Medicine

## 2011-02-05 NOTE — Telephone Encounter (Signed)
Letter mailed to schedule a CPE 

## 2011-02-26 ENCOUNTER — Other Ambulatory Visit: Payer: Self-pay | Admitting: Family Medicine

## 2011-02-26 NOTE — Telephone Encounter (Signed)
Apt scheduled for March 2013--Rx faxed   KP

## 2011-03-21 ENCOUNTER — Other Ambulatory Visit: Payer: Self-pay | Admitting: Family Medicine

## 2011-03-21 NOTE — Telephone Encounter (Signed)
Upcoming apt 05/19/10.     KP

## 2011-03-27 ENCOUNTER — Encounter: Payer: BLUE CROSS/BLUE SHIELD | Admitting: Family Medicine

## 2011-04-01 ENCOUNTER — Telehealth: Payer: Self-pay | Admitting: Family Medicine

## 2011-04-01 ENCOUNTER — Ambulatory Visit (INDEPENDENT_AMBULATORY_CARE_PROVIDER_SITE_OTHER): Payer: BLUE CROSS/BLUE SHIELD | Admitting: Internal Medicine

## 2011-04-01 VITALS — BP 136/88 | HR 82 | Temp 97.9°F | Wt 275.0 lb

## 2011-04-01 DIAGNOSIS — M546 Pain in thoracic spine: Secondary | ICD-10-CM

## 2011-04-01 MED ORDER — CYCLOBENZAPRINE HCL 10 MG PO TABS: 10.0000 mg | ORAL_TABLET | Freq: Two times a day (BID) | ORAL | Status: AC | PRN

## 2011-04-01 MED ORDER — NAPROXEN 500 MG PO TABS: 500.0000 mg | ORAL_TABLET | Freq: Two times a day (BID) | ORAL | Status: DC | PRN

## 2011-04-01 NOTE — Telephone Encounter (Signed)
Patient was seen and call has been resolved.     KP

## 2011-04-01 NOTE — Patient Instructions (Signed)
Use a heating pad on the back. Naproxen as needed for pain, always take it with food , watch for stomach pain or nausea  Flexeril as needed for muscle spasms, it will cause drowsiness. If no better in a week, please call us. Robitussin-DM as needed for cough

## 2011-04-01 NOTE — Telephone Encounter (Signed)
Caller: Caroline Ward/Patient; PCP: Lelon Perla.; CB#: [phone]; ; ; Call regarding Back Pain; Onset 03/30/11; no injury that she can recall.  Afebrile.  Pain is in center of the back, and thought it was from a cough she had from a respiratory illness, but the pain has worsened, while the cough has not.  Per protocol, advised appt within 24 hours; appt sched 04/01/11 1515 with Dr. Drue Novel.

## 2011-04-01 NOTE — Telephone Encounter (Signed)
appt sched 04/01/11 1515 with Dr. Paz.--SS  [facility] Triage Call Report Triage Record Num: [id number] Operator: Chevis Pretty Patient Name: Caroline Ward Call Date & Time: 04/01/2011 11:08:13AM Patient Phone: [phone] PCP: Lelon Perla Patient Gender: Female PCP Fax : [phone] Patient DOB: 02-Jan-1961 Practice Name: [facility] Reason for Call: Caller: Raelynn/Patient; PCP: Lelon Perla.; CB#: [phone]; ; ; Call regarding Back Pain; Onset 03/30/11; no injury that she can recall. Afebrile. Pain is in center of the back, and thought it was from a cough she had from a respiratory illness, but the pain has worsened, while the cough has not. Per protocol, advised appt within 24 hours; appt sched 04/01/11 1515 with Dr. Drue Novel. Protocol(s) Used: Back Symptoms Recommended Outcome per Protocol: See Provider within 24 hours Reason for Outcome: Pain intensifies with coughing, sneezing or straining Care Advice: ~ Call provider if symptoms worsen or new symptoms develop. ~ Avoid heavy lifting, bending and twisting of the back, and prolonged sitting until evaluated by provider. Apply a cloth-covered cold or ice pack to the area for 20 minutes 4 to 8 times a day for relief of pain for the first 24-48 hours. After 24 to 48 hours of cold application, use a cloth-covered heat pack to the area for 20 minutes 3 to 4 times a day. ~ Sleep on a moderately firm mattress. Try sleeping on back with a pillow placed under knees or sleep on side with knees bent and a pillow between knees. When lying on stomach, place pillow under the abdomen and pelvis; do not use a pillow for your head. ~ Go to the ED if you have worsening pain, numbness or weakness of arms or legs, cannot walk, or have new unexplained changes in bladder or bowel function. Another adult should drive. ~ ~ SYMPTOM / CONDITION MANAGEMENT ~ CAUTIONS Analgesic/Antipyretic Advice - Acetaminophen: Consider  acetaminophen as directed on label or by pharmacist/provider for pain or fever PRECAUTIONS: - Use if there is no history of liver disease, alcoholism, or intake of three or more alcohol drinks per day - Only if approved by provider during pregnancy or when breastfeeding - During pregnancy, acetaminophen should not be taken more than 3 consecutive days without telling provider - Do not exceed recommended dose or frequency ~ Analgesic/Antipyretic Advice - NSAIDs: Consider aspirin, ibuprofen, naproxen or ketoprofen for pain or fever as directed on label or by pharmacist/provider. PRECAUTIONS: - If over 29 years of age, should not take longer than 1 week without consulting provider. EXCEPTIONS: - Should not be used if taking blood thinners or have bleeding problems. - Do not use if have history of sensitivity/allergy to any of these medications; or history of cardiovascular, ulcer, kidney, liver disease or diabetes unless approved by provider. - Do not exceed recommended dose or frequency. ~

## 2011-04-01 NOTE — Progress Notes (Signed)
  Subjective:    Patient ID: Caroline Ward, female    DOB: February 11, 1960, 51 y.o.   MRN: [id number]  HPI Acute visit  c/o thoracic pain, around T3-T4, started  3 days ago, it is bilateral, on and off, feels like spasms in the back that radiates anteriorly to the abdomen bilaterally as well. Pain is worse with bending or twisting her torso.  PMH Depression-anxiety Obesity  PSH no  Review of Systems Denies any rash on the back or abdomen. No recent airplane trip, no recent calf pain or swelling. She is recording from a respiratory virus, has been coughing on and off for at least 3 weeks. In general she feels better but she has a lingering cough.     Objective:   Physical Exam  Musculoskeletal:       Arms:  Alert and oriented x3, no apparent distress. Lungs are clear to auscultation bilaterally Cardiovascular regular rate and rhythm, no murmur Abdomen is nondistended, soft, nontender. Skin: No rash in the abdomen or back     Assessment & Plan:  Thoracic pain, 3 days' history of thoracic pain in the setting of recent prolonged cough, pain has mechanical features. Most likely she has a thoracic sprain from coughing. Will treat conservatively, see instructions. If no better will proceed with a x-ray or further evaluation.

## 2011-04-02 ENCOUNTER — Encounter: Payer: Self-pay | Admitting: Internal Medicine

## 2011-04-21 ENCOUNTER — Other Ambulatory Visit: Payer: Self-pay | Admitting: Family Medicine

## 2011-04-25 ENCOUNTER — Other Ambulatory Visit: Payer: Self-pay | Admitting: Family Medicine

## 2011-05-19 ENCOUNTER — Ambulatory Visit (INDEPENDENT_AMBULATORY_CARE_PROVIDER_SITE_OTHER): Payer: BC Managed Care – PPO | Admitting: Family Medicine

## 2011-05-19 ENCOUNTER — Other Ambulatory Visit (HOSPITAL_COMMUNITY)
Admission: RE | Admit: 2011-05-19 | Discharge: 2011-05-19 | Disposition: A | Discharge status: Home / Self Care | Payer: BC Managed Care – PPO | Source: Ambulatory Visit | Attending: Family Medicine | Admitting: Family Medicine

## 2011-05-19 ENCOUNTER — Encounter: Payer: Self-pay | Admitting: Family Medicine

## 2011-05-19 VITALS — BP 116/78 | HR 55 | Temp 98.3°F | Ht 68.0 in | Wt 270.8 lb

## 2011-05-19 DIAGNOSIS — F329 Major depressive disorder, single episode, unspecified: Secondary | ICD-10-CM

## 2011-05-19 DIAGNOSIS — Z13220 Encounter for screening for lipoid disorders: Secondary | ICD-10-CM

## 2011-05-19 DIAGNOSIS — Z124 Encounter for screening for malignant neoplasm of cervix: Secondary | ICD-10-CM

## 2011-05-19 DIAGNOSIS — F32A Depression, unspecified: Secondary | ICD-10-CM

## 2011-05-19 DIAGNOSIS — Z309 Encounter for contraceptive management, unspecified: Secondary | ICD-10-CM

## 2011-05-19 DIAGNOSIS — Z0000 Encounter for general adult medical examination without abnormal findings: Secondary | ICD-10-CM

## 2011-05-19 DIAGNOSIS — Z01419 Encounter for gynecological examination (general) (routine) without abnormal findings: Secondary | ICD-10-CM | POA: Insufficient documentation

## 2011-05-19 DIAGNOSIS — IMO0001 Reserved for inherently not codable concepts without codable children: Secondary | ICD-10-CM

## 2011-05-19 LAB — CBC WITH DIFFERENTIAL/PLATELET
Basophils Absolute: 0.1 10*3/uL (ref 0.0–0.1)
Eosinophils Absolute: 0.2 10*3/uL (ref 0.0–0.7)
Lymphocytes Relative: 22 % (ref 12.0–46.0)
MCHC: 33.3 g/dL (ref 30.0–36.0)
Neutrophils Relative %: 71.3 % (ref 43.0–77.0)
Platelets: 333 10*3/uL (ref 150.0–400.0)
RDW: 14.5 % (ref 11.5–14.6)

## 2011-05-19 LAB — HEPATIC FUNCTION PANEL
Albumin: 3.4 g/dL — ABNORMAL LOW (ref 3.5–5.2)
Alkaline Phosphatase: 59 U/L (ref 39–117)
Bilirubin, Direct: 0 mg/dL (ref 0.0–0.3)
Total Bilirubin: 0.5 mg/dL (ref 0.3–1.2)

## 2011-05-19 LAB — BASIC METABOLIC PANEL
CO2: 22 mEq/L (ref 19–32)
Calcium: 8.6 mg/dL (ref 8.4–10.5)
Creatinine, Ser: 0.8 mg/dL (ref 0.4–1.2)
Glucose, Bld: 85 mg/dL (ref 70–99)

## 2011-05-19 LAB — LIPID PANEL
HDL: 47.7 mg/dL (ref >39.00)
VLDL: 30.4 mg/dL (ref 0.0–40.0)

## 2011-05-19 LAB — POCT URINALYSIS DIPSTICK
Protein, UA: NEGATIVE
Spec Grav, UA: 1.015
Urobilinogen, UA: 0.2

## 2011-05-19 LAB — TSH: TSH: 2.49 u[IU]/mL (ref 0.35–5.50)

## 2011-05-19 MED ORDER — NORGESTIMATE-ETH ESTRADIOL 0.25-35 MG-MCG PO TABS: ORAL_TABLET | ORAL | Status: DC

## 2011-05-19 MED ORDER — CITALOPRAM HYDROBROMIDE 20 MG PO TABS: ORAL_TABLET | ORAL | Status: DC

## 2011-05-19 NOTE — Patient Instructions (Signed)
Preventive Care for Adults, Female A healthy lifestyle and preventive care can promote health and wellness. Preventive health guidelines for women include the following key practices.  A routine yearly physical is a good way to check with your caregiver about your health and preventive screening. It is a chance to share any concerns and updates on your health, and to receive a thorough exam.   Visit your dentist for a routine exam and preventive care every 6 months. Brush your teeth twice a day and floss once a day. Good oral hygiene prevents tooth decay and gum disease.   The frequency of eye exams is based on your age, health, family medical history, use of contact lenses, and other factors. Follow your caregiver's recommendations for frequency of eye exams.   Eat a healthy diet. Foods like vegetables, fruits, whole grains, low-fat dairy products, and lean protein foods contain the nutrients you need without too many calories. Decrease your intake of foods high in solid fats, added sugars, and salt. Eat the right amount of calories for you.Get information about a proper diet from your caregiver, if necessary.   Regular physical exercise is one of the most important things you can do for your health. Most adults should get at least 150 minutes of moderate-intensity exercise (any activity that increases your heart rate and causes you to sweat) each week. In addition, most adults need muscle-strengthening exercises on 2 or more days a week.   Maintain a healthy weight. The body mass index (BMI) is a screening tool to identify possible weight problems. It provides an estimate of body fat based on height and weight. Your caregiver can help determine your BMI, and can help you achieve or maintain a healthy weight.For adults 20 years and older:   A BMI below 18.5 is considered underweight.   A BMI of 18.5 to 24.9 is normal.   A BMI of 25 to 29.9 is considered overweight.   A BMI of 30 and above is  considered obese.   Maintain normal blood lipids and cholesterol levels by exercising and minimizing your intake of saturated fat. Eat a balanced diet with plenty of fruit and vegetables. Blood tests for lipids and cholesterol should begin at age 20 and be repeated every 5 years. If your lipid or cholesterol levels are high, you are over 50, or you are at high risk for heart disease, you may need your cholesterol levels checked more frequently.Ongoing high lipid and cholesterol levels should be treated with medicines if diet and exercise are not effective.   If you smoke, find out from your caregiver how to quit. If you do not use tobacco, do not start.   If you are pregnant, do not drink alcohol. If you are breastfeeding, be very cautious about drinking alcohol. If you are not pregnant and choose to drink alcohol, do not exceed 1 drink per day. One drink is considered to be 12 ounces (355 mL) of beer, 5 ounces (148 mL) of wine, or 1.5 ounces (44 mL) of liquor.   Avoid use of street drugs. Do not share needles with anyone. Ask for help if you need support or instructions about stopping the use of drugs.   High blood pressure causes heart disease and increases the risk of stroke. Your blood pressure should be checked at least every 1 to 2 years. Ongoing high blood pressure should be treated with medicines if weight loss and exercise are not effective.   If you are 55 to 49   years old, ask your caregiver if you should take aspirin to prevent strokes.   Diabetes screening involves taking a blood sample to check your fasting blood sugar level. This should be done once every 3 years, after age 45, if you are within normal weight and without risk factors for diabetes. Testing should be considered at a younger age or be carried out more frequently if you are overweight and have at least 1 risk factor for diabetes.   Breast cancer screening is essential preventive care for women. You should practice "breast  self-awareness." This means understanding the normal appearance and feel of your breasts and may include breast self-examination. Any changes detected, no matter how small, should be reported to a caregiver. Women in their 20s and 30s should have a clinical breast exam (CBE) by a caregiver as part of a regular health exam every 1 to 3 years. After age 40, women should have a CBE every year. Starting at age 40, women should consider having a mammography (breast X-ray test) every year. Women who have a family history of breast cancer should talk to their caregiver about genetic screening. Women at a high risk of breast cancer should talk to their caregivers about having magnetic resonance imaging (MRI) and a mammography every year.   The Pap test is a screening test for cervical cancer. A Pap test can show cell changes on the cervix that might become cervical cancer if left untreated. A Pap test is a procedure in which cells are obtained and examined from the lower end of the uterus (cervix).   Women should have a Pap test starting at age 21.   Between ages 21 and 29, Pap tests should be repeated every 2 years.   Beginning at age 30, you should have a Pap test every 3 years as long as the past 3 Pap tests have been normal.   Some women have medical problems that increase the chance of getting cervical cancer. Talk to your caregiver about these problems. It is especially important to talk to your caregiver if a new problem develops soon after your last Pap test. In these cases, your caregiver may recommend more frequent screening and Pap tests.   The above recommendations are the same for women who have or have not gotten the vaccine for human papillomavirus (HPV).   If you had a hysterectomy for a problem that was not cancer or a condition that could lead to cancer, then you no longer need Pap tests. Even if you no longer need a Pap test, a regular exam is a good idea to make sure no other problems are  starting.   If you are between ages 65 and 70, and you have had normal Pap tests going back 10 years, you no longer need Pap tests. Even if you no longer need a Pap test, a regular exam is a good idea to make sure no other problems are starting.   If you have had past treatment for cervical cancer or a condition that could lead to cancer, you need Pap tests and screening for cancer for at least 20 years after your treatment.   If Pap tests have been discontinued, risk factors (such as a new sexual partner) need to be reassessed to determine if screening should be resumed.   The HPV test is an additional test that may be used for cervical cancer screening. The HPV test looks for the virus that can cause the cell changes on the cervix.   The cells collected during the Pap test can be tested for HPV. The HPV test could be used to screen women aged 30 years and older, and should be used in women of any age who have unclear Pap test results. After the age of 30, women should have HPV testing at the same frequency as a Pap test.   Colorectal cancer can be detected and often prevented. Most routine colorectal cancer screening begins at the age of 50 and continues through age 75. However, your caregiver may recommend screening at an earlier age if you have risk factors for colon cancer. On a yearly basis, your caregiver may provide home test kits to check for hidden blood in the stool. Use of a small camera at the end of a tube, to directly examine the colon (sigmoidoscopy or colonoscopy), can detect the earliest forms of colorectal cancer. Talk to your caregiver about this at age 50, when routine screening begins. Direct examination of the colon should be repeated every 5 to 10 years through age 75, unless early forms of pre-cancerous polyps or small growths are found.   Hepatitis C blood testing is recommended for all people born from 1945 through 1965 and any individual with known risks for hepatitis C.    Practice safe sex. Use condoms and avoid high-risk sexual practices to reduce the spread of sexually transmitted infections (STIs). STIs include gonorrhea, chlamydia, syphilis, trichomonas, herpes, HPV, and human immunodeficiency virus (HIV). Herpes, HIV, and HPV are viral illnesses that have no cure. They can result in disability, cancer, and death. Sexually active women aged 25 and younger should be checked for chlamydia. Older women with new or multiple partners should also be tested for chlamydia. Testing for other STIs is recommended if you are sexually active and at increased risk.   Osteoporosis is a disease in which the bones lose minerals and strength with aging. This can result in serious bone fractures. The risk of osteoporosis can be identified using a bone density scan. Women ages 65 and over and women at risk for fractures or osteoporosis should discuss screening with their caregivers. Ask your caregiver whether you should take a calcium supplement or vitamin D to reduce the rate of osteoporosis.   Menopause can be associated with physical symptoms and risks. Hormone replacement therapy is available to decrease symptoms and risks. You should talk to your caregiver about whether hormone replacement therapy is right for you.   Use sunscreen with sun protection factor (SPF) of 30 or more. Apply sunscreen liberally and repeatedly throughout the day. You should seek shade when your shadow is shorter than you. Protect yourself by wearing long sleeves, pants, a wide-brimmed hat, and sunglasses year round, whenever you are outdoors.   Once a month, do a whole body skin exam, using a mirror to look at the skin on your back. Notify your caregiver of new moles, moles that have irregular borders, moles that are larger than a pencil eraser, or moles that have changed in shape or color.   Stay current with required immunizations.   Influenza. You need a dose every fall (or winter). The composition of  the flu vaccine changes each year, so being vaccinated once is not enough.   Pneumococcal polysaccharide. You need 1 to 2 doses if you smoke cigarettes or if you have certain chronic medical conditions. You need 1 dose at age 65 (or older) if you have never been vaccinated.   Tetanus, diphtheria, pertussis (Tdap, Td). Get 1 dose of   Tdap vaccine if you are younger than age 65, are over 65 and have contact with an infant, are a healthcare worker, are pregnant, or simply want to be protected from whooping cough. After that, you need a Td booster dose every 10 years. Consult your caregiver if you have not had at least 3 tetanus and diphtheria-containing shots sometime in your life or have a deep or dirty wound.   HPV. You need this vaccine if you are a woman age 26 or younger. The vaccine is given in 3 doses over 6 months.   Measles, mumps, rubella (MMR). You need at least 1 dose of MMR if you were born in 1957 or later. You may also need a second dose.   Meningococcal. If you are age 19 to 21 and a first-year college student living in a residence hall, or have one of several medical conditions, you need to get vaccinated against meningococcal disease. You may also need additional booster doses.   Zoster (shingles). If you are age 60 or older, you should get this vaccine.   Varicella (chickenpox). If you have never had chickenpox or you were vaccinated but received only 1 dose, talk to your caregiver to find out if you need this vaccine.   Hepatitis A. You need this vaccine if you have a specific risk factor for hepatitis A virus infection or you simply wish to be protected from this disease. The vaccine is usually given as 2 doses, 6 to 18 months apart.   Hepatitis B. You need this vaccine if you have a specific risk factor for hepatitis B virus infection or you simply wish to be protected from this disease. The vaccine is given in 3 doses, usually over 6 months.  Preventive Services /  Frequency Ages 19 to 39  Blood pressure check.** / Every 1 to 2 years.   Lipid and cholesterol check.** / Every 5 years beginning at age 20.   Clinical breast exam.** / Every 3 years for women in their 20s and 30s.   Pap test.** / Every 2 years from ages 21 through 29. Every 3 years starting at age 30 through age 65 or 70 with a history of 3 consecutive normal Pap tests.   HPV screening.** / Every 3 years from ages 30 through ages 65 to 70 with a history of 3 consecutive normal Pap tests.   Hepatitis C blood test.** / For any individual with known risks for hepatitis C.   Skin self-exam. / Monthly.   Influenza immunization.** / Every year.   Pneumococcal polysaccharide immunization.** / 1 to 2 doses if you smoke cigarettes or if you have certain chronic medical conditions.   Tetanus, diphtheria, pertussis (Tdap, Td) immunization. / A one-time dose of Tdap vaccine. After that, you need a Td booster dose every 10 years.   HPV immunization. / 3 doses over 6 months, if you are 26 and younger.   Measles, mumps, rubella (MMR) immunization. / You need at least 1 dose of MMR if you were born in 1957 or later. You may also need a second dose.   Meningococcal immunization. / 1 dose if you are age 19 to 21 and a first-year college student living in a residence hall, or have one of several medical conditions, you need to get vaccinated against meningococcal disease. You may also need additional booster doses.   Varicella immunization.** / Consult your caregiver.   Hepatitis A immunization.** / Consult your caregiver. 2 doses, 6 to 18 months   apart.   Hepatitis B immunization.** / Consult your caregiver. 3 doses usually over 6 months.  Ages 40 to 64  Blood pressure check.** / Every 1 to 2 years.   Lipid and cholesterol check.** / Every 5 years beginning at age 20.   Clinical breast exam.** / Every year after age 40.   Mammogram.** / Every year beginning at age 40 and continuing for as  long as you are in good health. Consult with your caregiver.   Pap test.** / Every 3 years starting at age 30 through age 65 or 70 with a history of 3 consecutive normal Pap tests.   HPV screening.** / Every 3 years from ages 30 through ages 65 to 70 with a history of 3 consecutive normal Pap tests.   Fecal occult blood test (FOBT) of stool. / Every year beginning at age 50 and continuing until age 75. You may not need to do this test if you get a colonoscopy every 10 years.   Flexible sigmoidoscopy or colonoscopy.** / Every 5 years for a flexible sigmoidoscopy or every 10 years for a colonoscopy beginning at age 50 and continuing until age 75.   Hepatitis C blood test.** / For all people born from 1945 through 1965 and any individual with known risks for hepatitis C.   Skin self-exam. / Monthly.   Influenza immunization.** / Every year.   Pneumococcal polysaccharide immunization.** / 1 to 2 doses if you smoke cigarettes or if you have certain chronic medical conditions.   Tetanus, diphtheria, pertussis (Tdap, Td) immunization.** / A one-time dose of Tdap vaccine. After that, you need a Td booster dose every 10 years.   Measles, mumps, rubella (MMR) immunization. / You need at least 1 dose of MMR if you were born in 1957 or later. You may also need a second dose.   Varicella immunization.** / Consult your caregiver.   Meningococcal immunization.** / Consult your caregiver.   Hepatitis A immunization.** / Consult your caregiver. 2 doses, 6 to 18 months apart.   Hepatitis B immunization.** / Consult your caregiver. 3 doses, usually over 6 months.  Ages 65 and over  Blood pressure check.** / Every 1 to 2 years.   Lipid and cholesterol check.** / Every 5 years beginning at age 20.   Clinical breast exam.** / Every year after age 40.   Mammogram.** / Every year beginning at age 40 and continuing for as long as you are in good health. Consult with your caregiver.   Pap test.** /  Every 3 years starting at age 30 through age 65 or 70 with a 3 consecutive normal Pap tests. Testing can be stopped between 65 and 70 with 3 consecutive normal Pap tests and no abnormal Pap or HPV tests in the past 10 years.   HPV screening.** / Every 3 years from ages 30 through ages 65 or 70 with a history of 3 consecutive normal Pap tests. Testing can be stopped between 65 and 70 with 3 consecutive normal Pap tests and no abnormal Pap or HPV tests in the past 10 years.   Fecal occult blood test (FOBT) of stool. / Every year beginning at age 50 and continuing until age 75. You may not need to do this test if you get a colonoscopy every 10 years.   Flexible sigmoidoscopy or colonoscopy.** / Every 5 years for a flexible sigmoidoscopy or every 10 years for a colonoscopy beginning at age 50 and continuing until age 75.   Hepatitis   C blood test.** / For all people born from 1945 through 1965 and any individual with known risks for hepatitis C.   Osteoporosis screening.** / A one-time screening for women ages 65 and over and women at risk for fractures or osteoporosis.   Skin self-exam. / Monthly.   Influenza immunization.** / Every year.   Pneumococcal polysaccharide immunization.** / 1 dose at age 65 (or older) if you have never been vaccinated.   Tetanus, diphtheria, pertussis (Tdap, Td) immunization. / A one-time dose of Tdap vaccine if you are over 65 and have contact with an infant, are a healthcare worker, or simply want to be protected from whooping cough. After that, you need a Td booster dose every 10 years.   Varicella immunization.** / Consult your caregiver.   Meningococcal immunization.** / Consult your caregiver.   Hepatitis A immunization.** / Consult your caregiver. 2 doses, 6 to 18 months apart.   Hepatitis B immunization.** / Check with your caregiver. 3 doses, usually over 6 months.  ** Family history and personal history of risk and conditions may change your caregiver's  recommendations. Document Released: 02/25/2001 Document Revised: 12/19/2010 Document Reviewed: 05/27/2010 [employer] Patient Information 2012 [employer]. 

## 2011-05-19 NOTE — Progress Notes (Signed)
Subjective:     Caroline Ward is a 51 y.o. female and is here for a comprehensive physical exam. The patient reports problems - plantar fascitis--see podiatry.  History   Social History  . Marital Status: Single    Spouse Name: N/A    Number of Children: N/A  . Years of Education: N/A   Occupational History  . self employed-- Engineer, drilling    Social History Main Topics  . Smoking status: Never Smoker   . Smokeless tobacco: Never Used  . Alcohol Use: Yes     Occ  . Drug Use: No  . Sexually Active: Not Currently -- Female partner(s)   Other Topics Concern  . Not on file   Social History Narrative   Exercise--no   Health Maintenance  Topic Date Due  . Mammogram  04/24/2011  . Influenza Vaccine  10/14/2011  . Tetanus/tdap  10/11/2012  . Pap Smear  05/19/2014    The following portions of the patient's history were reviewed and updated as appropriate: allergies, current medications, past family history, past medical history, past social history, past surgical history and problem list.  Review of Systems Review of Systems  Constitutional: Negative for activity change, appetite change and fatigue.  HENT: Negative for hearing loss, congestion, tinnitus and ear discharge.  dentist -due Eyes: Negative for visual disturbance (see opth--due) Respiratory: Negative for cough, chest tightness and shortness of breath.   Cardiovascular: Negative for chest pain, palpitations and leg swelling.  Gastrointestinal: Negative for abdominal pain, diarrhea, constipation and abdominal distention.  Genitourinary: Negative for urgency, frequency, decreased urine volume and difficulty urinating.  Musculoskeletal: Negative for back pain, arthralgias and gait problem.  Skin: Negative for color change, pallor and rash.  Neurological: Negative for dizziness, light-headedness, numbness and headaches.  Hematological: Negative for adenopathy. Does not bruise/bleed easily.  Psychiatric/Behavioral: Negative  for suicidal ideas, confusion, sleep disturbance, self-injury, dysphoric mood, decreased concentration and agitation.       Objective:    BP 116/78  Pulse 55  Temp(Src) 98.3 F (36.8 C) (Oral)  Ht 5\' 8"  (1.727 m)  Wt 270 lb 12.8 oz (122.834 kg)  BMI 41.17 kg/m2  SpO2 97%  LMP 04/28/2011 General appearance: alert, cooperative, appears stated age and no distress Head: Normocephalic, without obvious abnormality, atraumatic Eyes: conjunctivae/corneas clear. PERRL, EOM's intact. Fundi benign. Ears: normal TM's and external ear canals both ears Nose: Nares normal. Septum midline. Mucosa normal. No drainage or sinus tenderness. Throat: lips, mucosa, and tongue normal; teeth and gums normal Neck: no adenopathy, no carotid bruit, no JVD, supple, symmetrical, trachea midline and thyroid not enlarged, symmetric, no tenderness/mass/nodules Back: symmetric, no curvature. ROM normal. No CVA tenderness. Lungs: clear to auscultation bilaterally Breasts: normal appearance, no masses or tenderness Heart: regular rate and rhythm, S1, S2 normal, no murmur, click, rub or gallop Abdomen: soft, non-tender; bowel sounds normal; no masses,  no organomegaly Pelvic: cervix normal in appearance, external genitalia normal, no adnexal masses or tenderness, no cervical motion tenderness, rectovaginal septum normal, uterus normal size, shape, and consistency and vagina normal without discharge Extremities: extremities normal, atraumatic, no cyanosis or edema Pulses: 2+ and symmetric Skin: Skin color, texture, turgor normal. No rashes or lesions Lymph nodes: Cervical, supraclavicular, and axillary nodes normal. Neurologic: Alert and oriented X 3, normal strength and tone. Normal symmetric reflexes. Normal coordination and gait no depression or anxiety    Assessment:    Healthy female exam. --- bcp refilled Depression--- stable-- con't meds     Plan:  Check labs ghm utd---mammo needs to be done.  Pt will  schedule.   See After Visit Summary for Counseling Recommendations

## 2011-06-23 ENCOUNTER — Other Ambulatory Visit: Payer: Self-pay | Admitting: Internal Medicine

## 2011-06-23 NOTE — Telephone Encounter (Signed)
Refill done.  

## 2011-08-20 ENCOUNTER — Other Ambulatory Visit: Payer: Self-pay | Admitting: Internal Medicine

## 2011-08-20 NOTE — Telephone Encounter (Signed)
Dr. Drue Novel prescribed this to the pt on 6.10.13. OK to refill?

## 2011-08-21 NOTE — Telephone Encounter (Signed)
Refill done per Dr. Laury Axon.

## 2011-09-25 ENCOUNTER — Other Ambulatory Visit: Payer: Self-pay | Admitting: Family Medicine

## 2011-09-25 ENCOUNTER — Other Ambulatory Visit: Payer: Self-pay | Admitting: General Practice

## 2011-09-25 MED ORDER — NAPROXEN 500 MG PO TABS: 500.0000 mg | ORAL_TABLET | Freq: Two times a day (BID) | ORAL | Status: DC

## 2011-10-24 ENCOUNTER — Other Ambulatory Visit: Payer: Self-pay | Admitting: Family Medicine

## 2011-10-24 NOTE — Telephone Encounter (Signed)
Rx sent.    MW 

## 2011-11-18 ENCOUNTER — Other Ambulatory Visit: Payer: Self-pay | Admitting: Family Medicine

## 2011-11-18 NOTE — Telephone Encounter (Signed)
Last seen 05/19/11 and filled 10/24/11 # 30. Please advise     KP

## 2011-12-15 ENCOUNTER — Other Ambulatory Visit: Payer: Self-pay

## 2011-12-15 ENCOUNTER — Other Ambulatory Visit: Payer: Self-pay | Admitting: Family Medicine

## 2011-12-15 MED ORDER — ALPRAZOLAM 0.25 MG PO TABS: 0.2500 mg | ORAL_TABLET | Freq: Every day | ORAL | Status: DC | PRN

## 2011-12-15 NOTE — Telephone Encounter (Signed)
OK #30 

## 2011-12-15 NOTE — Telephone Encounter (Signed)
Alprazolam 0.25mg  requested ---This patient was last seen 05/19/11 and filled 01/15/10 # 30. Please advise      KP

## 2012-03-11 ENCOUNTER — Other Ambulatory Visit: Payer: Self-pay | Admitting: Family Medicine

## 2012-03-11 NOTE — Telephone Encounter (Signed)
Last seen 05/19/11 and filled 12/15/11 # 30 with 2 refills. Please advise    KP

## 2012-04-11 ENCOUNTER — Other Ambulatory Visit: Payer: Self-pay | Admitting: Family Medicine

## 2012-04-13 NOTE — Telephone Encounter (Signed)
Last seen and filled 05/19/11 #30 with 11 refills. No pending apts. Please advise       KP

## 2012-05-09 ENCOUNTER — Other Ambulatory Visit: Payer: Self-pay | Admitting: Family Medicine

## 2012-05-11 ENCOUNTER — Other Ambulatory Visit: Payer: Self-pay | Admitting: Family Medicine

## 2012-05-11 ENCOUNTER — Telehealth: Payer: Self-pay | Admitting: Family Medicine

## 2012-05-11 NOTE — Telephone Encounter (Signed)
Pt states that her rx were pending until she made an appointment. I schedule her appointment on 06/10/2012 at 1:00pm for a cpe. thanks

## 2012-05-11 NOTE — Telephone Encounter (Signed)
I schedule her appointment on 06/10/2012 at 1:00pm for a cpe. thanks

## 2012-05-11 NOTE — Telephone Encounter (Signed)
Last seen 05/19/11 and filled 04/11/12 #30. Please advise    KP

## 2012-05-11 NOTE — Telephone Encounter (Signed)
I will make Dr.Lowne aware     KP

## 2012-05-14 ENCOUNTER — Other Ambulatory Visit: Payer: Self-pay | Admitting: Internal Medicine

## 2012-05-14 NOTE — Telephone Encounter (Signed)
Last OV 05/19/11, Last filled 12/16/11 Pending appointment 06/10/2012

## 2012-05-26 ENCOUNTER — Other Ambulatory Visit: Payer: Self-pay | Admitting: *Deleted

## 2012-05-26 MED ORDER — CITALOPRAM HYDROBROMIDE 20 MG PO TABS: ORAL_TABLET | ORAL | Status: DC

## 2012-05-26 NOTE — Telephone Encounter (Signed)
PT has pending CPX 06-10-12, Rx sent

## 2012-06-09 ENCOUNTER — Encounter: Payer: Self-pay | Admitting: Lab

## 2012-06-09 ENCOUNTER — Telehealth: Payer: Self-pay

## 2012-06-09 DIAGNOSIS — Z0000 Encounter for general adult medical examination without abnormal findings: Secondary | ICD-10-CM

## 2012-06-09 NOTE — Telephone Encounter (Signed)
Ok for cpe labs

## 2012-06-09 NOTE — Telephone Encounter (Signed)
Please advise on  CPE labs      KP

## 2012-06-09 NOTE — Telephone Encounter (Signed)
Message copied by Arnette Norris on Wed Jun 09, 2012  9:27 AM ------      Message from: Marshell Garfinkel      Created: Tue Jun 08, 2012 11:26 AM       Patient is having cpe on Thursday at 1:00pm. She would like to come that morning and have her labs drawn. Can you place orders please? ------

## 2012-06-09 NOTE — Telephone Encounter (Signed)
Orders in..     KP 

## 2012-06-10 ENCOUNTER — Ambulatory Visit (INDEPENDENT_AMBULATORY_CARE_PROVIDER_SITE_OTHER): Payer: BC Managed Care – PPO | Admitting: Family Medicine

## 2012-06-10 ENCOUNTER — Encounter: Payer: Self-pay | Admitting: Gastroenterology

## 2012-06-10 ENCOUNTER — Encounter: Payer: Self-pay | Admitting: Family Medicine

## 2012-06-10 ENCOUNTER — Other Ambulatory Visit (INDEPENDENT_AMBULATORY_CARE_PROVIDER_SITE_OTHER): Payer: BC Managed Care – PPO

## 2012-06-10 VITALS — BP 122/76 | HR 76 | Temp 99.1°F | Ht 68.0 in | Wt 283.4 lb

## 2012-06-10 DIAGNOSIS — G43009 Migraine without aura, not intractable, without status migrainosus: Secondary | ICD-10-CM

## 2012-06-10 DIAGNOSIS — Z0000 Encounter for general adult medical examination without abnormal findings: Secondary | ICD-10-CM

## 2012-06-10 DIAGNOSIS — F411 Generalized anxiety disorder: Secondary | ICD-10-CM

## 2012-06-10 DIAGNOSIS — G43909 Migraine, unspecified, not intractable, without status migrainosus: Secondary | ICD-10-CM

## 2012-06-10 LAB — HEPATIC FUNCTION PANEL
ALT: 8 U/L (ref 0–35)
Total Bilirubin: 0.4 mg/dL (ref 0.3–1.2)

## 2012-06-10 LAB — CBC WITH DIFFERENTIAL/PLATELET
Basophils Absolute: 0.1 10*3/uL (ref 0.0–0.1)
Eosinophils Relative: 4.3 % (ref 0.0–5.0)
HCT: 38.6 % (ref 36.0–46.0)
Lymphs Abs: 2.6 10*3/uL (ref 0.7–4.0)
MCV: 82.3 fl (ref 78.0–100.0)
Monocytes Absolute: 0.6 10*3/uL (ref 0.1–1.0)
Platelets: 300 10*3/uL (ref 150.0–400.0)
RDW: 13.9 % (ref 11.5–14.6)

## 2012-06-10 LAB — BASIC METABOLIC PANEL
GFR: 83.29 mL/min (ref >60.00)
Glucose, Bld: 84 mg/dL (ref 70–99)
Potassium: 3.8 mEq/L (ref 3.5–5.1)
Sodium: 136 mEq/L (ref 135–145)

## 2012-06-10 LAB — LIPID PANEL
Cholesterol: 192 mg/dL (ref 0–200)
HDL: 47.2 mg/dL (ref >39.00)
Triglycerides: 110 mg/dL (ref 0.0–149.0)

## 2012-06-10 MED ORDER — ALPRAZOLAM 0.25 MG PO TABS: ORAL_TABLET | ORAL | Status: DC

## 2012-06-10 MED ORDER — NORGESTIMATE-ETH ESTRADIOL 0.25-35 MG-MCG PO TABS: ORAL_TABLET | ORAL | Status: DC

## 2012-06-10 MED ORDER — NAPROXEN 500 MG PO TABS: ORAL_TABLET | ORAL | Status: DC

## 2012-06-10 MED ORDER — CITALOPRAM HYDROBROMIDE 20 MG PO TABS: ORAL_TABLET | ORAL | Status: DC

## 2012-06-10 NOTE — Patient Instructions (Addendum)
Preventive Care for Adults, Female A healthy lifestyle and preventive care can promote health and wellness. Preventive health guidelines for women include the following key practices.  A routine yearly physical is a good way to check with your caregiver about your health and preventive screening. It is a chance to share any concerns and updates on your health, and to receive a thorough exam.  Visit your dentist for a routine exam and preventive care every 6 months. Brush your teeth twice a day and floss once a day. Good oral hygiene prevents tooth decay and gum disease.  The frequency of eye exams is based on your age, health, family medical history, use of contact lenses, and other factors. Follow your caregiver's recommendations for frequency of eye exams.  Eat a healthy diet. Foods like vegetables, fruits, whole grains, low-fat dairy products, and lean protein foods contain the nutrients you need without too many calories. Decrease your intake of foods high in solid fats, added sugars, and salt. Eat the right amount of calories for you.Get information about a proper diet from your caregiver, if necessary.  Regular physical exercise is one of the most important things you can do for your health. Most adults should get at least 150 minutes of moderate-intensity exercise (any activity that increases your heart rate and causes you to sweat) each week. In addition, most adults need muscle-strengthening exercises on 2 or more days a week.  Maintain a healthy weight. The body mass index (BMI) is a screening tool to identify possible weight problems. It provides an estimate of body fat based on height and weight. Your caregiver can help determine your BMI, and can help you achieve or maintain a healthy weight.For adults 20 years and older:  A BMI below 18.5 is considered underweight.  A BMI of 18.5 to 24.9 is normal.  A BMI of 25 to 29.9 is considered overweight.  A BMI of 30 and above is  considered obese.  Maintain normal blood lipids and cholesterol levels by exercising and minimizing your intake of saturated fat. Eat a balanced diet with plenty of fruit and vegetables. Blood tests for lipids and cholesterol should begin at age 20 and be repeated every 5 years. If your lipid or cholesterol levels are high, you are over 50, or you are at high risk for heart disease, you may need your cholesterol levels checked more frequently.Ongoing high lipid and cholesterol levels should be treated with medicines if diet and exercise are not effective.  If you smoke, find out from your caregiver how to quit. If you do not use tobacco, do not start.  If you are pregnant, do not drink alcohol. If you are breastfeeding, be very cautious about drinking alcohol. If you are not pregnant and choose to drink alcohol, do not exceed 1 drink per day. One drink is considered to be 12 ounces (355 mL) of beer, 5 ounces (148 mL) of wine, or 1.5 ounces (44 mL) of liquor.  Avoid use of street drugs. Do not share needles with anyone. Ask for help if you need support or instructions about stopping the use of drugs.  High blood pressure causes heart disease and increases the risk of stroke. Your blood pressure should be checked at least every 1 to 2 years. Ongoing high blood pressure should be treated with medicines if weight loss and exercise are not effective.  If you are 55 to 50 years old, ask your caregiver if you should take aspirin to prevent strokes.  Diabetes   screening involves taking a blood sample to check your fasting blood sugar level. This should be done once every 3 years, after age 45, if you are within normal weight and without risk factors for diabetes. Testing should be considered at a younger age or be carried out more frequently if you are overweight and have at least 1 risk factor for diabetes.  Breast cancer screening is essential preventive care for women. You should practice "breast  self-awareness." This means understanding the normal appearance and feel of your breasts and may include breast self-examination. Any changes detected, no matter how small, should be reported to a caregiver. Women in their 20s and 30s should have a clinical breast exam (CBE) by a caregiver as part of a regular health exam every 1 to 3 years. After age 40, women should have a CBE every year. Starting at age 40, women should consider having a mammography (breast X-ray test) every year. Women who have a family history of breast cancer should talk to their caregiver about genetic screening. Women at a high risk of breast cancer should talk to their caregivers about having magnetic resonance imaging (MRI) and a mammography every year.  The Pap test is a screening test for cervical cancer. A Pap test can show cell changes on the cervix that might become cervical cancer if left untreated. A Pap test is a procedure in which cells are obtained and examined from the lower end of the uterus (cervix).  Women should have a Pap test starting at age 21.  Between ages 21 and 29, Pap tests should be repeated every 2 years.  Beginning at age 30, you should have a Pap test every 3 years as long as the past 3 Pap tests have been normal.  Some women have medical problems that increase the chance of getting cervical cancer. Talk to your caregiver about these problems. It is especially important to talk to your caregiver if a new problem develops soon after your last Pap test. In these cases, your caregiver may recommend more frequent screening and Pap tests.  The above recommendations are the same for women who have or have not gotten the vaccine for human papillomavirus (HPV).  If you had a hysterectomy for a problem that was not cancer or a condition that could lead to cancer, then you no longer need Pap tests. Even if you no longer need a Pap test, a regular exam is a good idea to make sure no other problems are  starting.  If you are between ages 65 and 70, and you have had normal Pap tests going back 10 years, you no longer need Pap tests. Even if you no longer need a Pap test, a regular exam is a good idea to make sure no other problems are starting.  If you have had past treatment for cervical cancer or a condition that could lead to cancer, you need Pap tests and screening for cancer for at least 20 years after your treatment.  If Pap tests have been discontinued, risk factors (such as a new sexual partner) need to be reassessed to determine if screening should be resumed.  The HPV test is an additional test that may be used for cervical cancer screening. The HPV test looks for the virus that can cause the cell changes on the cervix. The cells collected during the Pap test can be tested for HPV. The HPV test could be used to screen women aged 30 years and older, and should   be used in women of any age who have unclear Pap test results. After the age of 30, women should have HPV testing at the same frequency as a Pap test.  Colorectal cancer can be detected and often prevented. Most routine colorectal cancer screening begins at the age of 50 and continues through age 75. However, your caregiver may recommend screening at an earlier age if you have risk factors for colon cancer. On a yearly basis, your caregiver may provide home test kits to check for hidden blood in the stool. Use of a small camera at the end of a tube, to directly examine the colon (sigmoidoscopy or colonoscopy), can detect the earliest forms of colorectal cancer. Talk to your caregiver about this at age 50, when routine screening begins. Direct examination of the colon should be repeated every 5 to 10 years through age 75, unless early forms of pre-cancerous polyps or small growths are found.  Hepatitis C blood testing is recommended for all people born from 1945 through 1965 and any individual with known risks for hepatitis C.  Practice  safe sex. Use condoms and avoid high-risk sexual practices to reduce the spread of sexually transmitted infections (STIs). STIs include gonorrhea, chlamydia, syphilis, trichomonas, herpes, HPV, and human immunodeficiency virus (HIV). Herpes, HIV, and HPV are viral illnesses that have no cure. They can result in disability, cancer, and death. Sexually active women aged 25 and younger should be checked for chlamydia. Older women with new or multiple partners should also be tested for chlamydia. Testing for other STIs is recommended if you are sexually active and at increased risk.  Osteoporosis is a disease in which the bones lose minerals and strength with aging. This can result in serious bone fractures. The risk of osteoporosis can be identified using a bone density scan. Women ages 65 and over and women at risk for fractures or osteoporosis should discuss screening with their caregivers. Ask your caregiver whether you should take a calcium supplement or vitamin D to reduce the rate of osteoporosis.  Menopause can be associated with physical symptoms and risks. Hormone replacement therapy is available to decrease symptoms and risks. You should talk to your caregiver about whether hormone replacement therapy is right for you.  Use sunscreen with sun protection factor (SPF) of 30 or more. Apply sunscreen liberally and repeatedly throughout the day. You should seek shade when your shadow is shorter than you. Protect yourself by wearing long sleeves, pants, a wide-brimmed hat, and sunglasses year round, whenever you are outdoors.  Once a month, do a whole body skin exam, using a mirror to look at the skin on your back. Notify your caregiver of new moles, moles that have irregular borders, moles that are larger than a pencil eraser, or moles that have changed in shape or color.  Stay current with required immunizations.  Influenza. You need a dose every fall (or winter). The composition of the flu vaccine  changes each year, so being vaccinated once is not enough.  Pneumococcal polysaccharide. You need 1 to 2 doses if you smoke cigarettes or if you have certain chronic medical conditions. You need 1 dose at age 65 (or older) if you have never been vaccinated.  Tetanus, diphtheria, pertussis (Tdap, Td). Get 1 dose of Tdap vaccine if you are younger than age 65, are over 65 and have contact with an infant, are a healthcare worker, are pregnant, or simply want to be protected from whooping cough. After that, you need a Td   booster dose every 10 years. Consult your caregiver if you have not had at least 3 tetanus and diphtheria-containing shots sometime in your life or have a deep or dirty wound.  HPV. You need this vaccine if you are a woman age 26 or younger. The vaccine is given in 3 doses over 6 months.  Measles, mumps, rubella (MMR). You need at least 1 dose of MMR if you were born in 1957 or later. You may also need a second dose.  Meningococcal. If you are age 19 to 21 and a first-year college student living in a residence hall, or have one of several medical conditions, you need to get vaccinated against meningococcal disease. You may also need additional booster doses.  Zoster (shingles). If you are age 60 or older, you should get this vaccine.  Varicella (chickenpox). If you have never had chickenpox or you were vaccinated but received only 1 dose, talk to your caregiver to find out if you need this vaccine.  Hepatitis A. You need this vaccine if you have a specific risk factor for hepatitis A virus infection or you simply wish to be protected from this disease. The vaccine is usually given as 2 doses, 6 to 18 months apart.  Hepatitis B. You need this vaccine if you have a specific risk factor for hepatitis B virus infection or you simply wish to be protected from this disease. The vaccine is given in 3 doses, usually over 6 months. Preventive Services / Frequency Ages 19 to 39  Blood  pressure check.** / Every 1 to 2 years.  Lipid and cholesterol check.** / Every 5 years beginning at age 20.  Clinical breast exam.** / Every 3 years for women in their 20s and 30s.  Pap test.** / Every 2 years from ages 21 through 29. Every 3 years starting at age 30 through age 65 or 70 with a history of 3 consecutive normal Pap tests.  HPV screening.** / Every 3 years from ages 30 through ages 65 to 70 with a history of 3 consecutive normal Pap tests.  Hepatitis C blood test.** / For any individual with known risks for hepatitis C.  Skin self-exam. / Monthly.  Influenza immunization.** / Every year.  Pneumococcal polysaccharide immunization.** / 1 to 2 doses if you smoke cigarettes or if you have certain chronic medical conditions.  Tetanus, diphtheria, pertussis (Tdap, Td) immunization. / A one-time dose of Tdap vaccine. After that, you need a Td booster dose every 10 years.  HPV immunization. / 3 doses over 6 months, if you are 26 and younger.  Measles, mumps, rubella (MMR) immunization. / You need at least 1 dose of MMR if you were born in 1957 or later. You may also need a second dose.  Meningococcal immunization. / 1 dose if you are age 19 to 21 and a first-year college student living in a residence hall, or have one of several medical conditions, you need to get vaccinated against meningococcal disease. You may also need additional booster doses.  Varicella immunization.** / Consult your caregiver.  Hepatitis A immunization.** / Consult your caregiver. 2 doses, 6 to 18 months apart.  Hepatitis B immunization.** / Consult your caregiver. 3 doses usually over 6 months. Ages 40 to 64  Blood pressure check.** / Every 1 to 2 years.  Lipid and cholesterol check.** / Every 5 years beginning at age 20.  Clinical breast exam.** / Every year after age 40.  Mammogram.** / Every year beginning at age 40   and continuing for as long as you are in good health. Consult with your  caregiver.  Pap test.** / Every 3 years starting at age 30 through age 65 or 70 with a history of 3 consecutive normal Pap tests.  HPV screening.** / Every 3 years from ages 30 through ages 65 to 70 with a history of 3 consecutive normal Pap tests.  Fecal occult blood test (FOBT) of stool. / Every year beginning at age 50 and continuing until age 75. You may not need to do this test if you get a colonoscopy every 10 years.  Flexible sigmoidoscopy or colonoscopy.** / Every 5 years for a flexible sigmoidoscopy or every 10 years for a colonoscopy beginning at age 50 and continuing until age 75.  Hepatitis C blood test.** / For all people born from 1945 through 1965 and any individual with known risks for hepatitis C.  Skin self-exam. / Monthly.  Influenza immunization.** / Every year.  Pneumococcal polysaccharide immunization.** / 1 to 2 doses if you smoke cigarettes or if you have certain chronic medical conditions.  Tetanus, diphtheria, pertussis (Tdap, Td) immunization.** / A one-time dose of Tdap vaccine. After that, you need a Td booster dose every 10 years.  Measles, mumps, rubella (MMR) immunization. / You need at least 1 dose of MMR if you were born in 1957 or later. You may also need a second dose.  Varicella immunization.** / Consult your caregiver.  Meningococcal immunization.** / Consult your caregiver.  Hepatitis A immunization.** / Consult your caregiver. 2 doses, 6 to 18 months apart.  Hepatitis B immunization.** / Consult your caregiver. 3 doses, usually over 6 months. Ages 65 and over  Blood pressure check.** / Every 1 to 2 years.  Lipid and cholesterol check.** / Every 5 years beginning at age 20.  Clinical breast exam.** / Every year after age 40.  Mammogram.** / Every year beginning at age 40 and continuing for as long as you are in good health. Consult with your caregiver.  Pap test.** / Every 3 years starting at age 30 through age 65 or 70 with a 3  consecutive normal Pap tests. Testing can be stopped between 65 and 70 with 3 consecutive normal Pap tests and no abnormal Pap or HPV tests in the past 10 years.  HPV screening.** / Every 3 years from ages 30 through ages 65 or 70 with a history of 3 consecutive normal Pap tests. Testing can be stopped between 65 and 70 with 3 consecutive normal Pap tests and no abnormal Pap or HPV tests in the past 10 years.  Fecal occult blood test (FOBT) of stool. / Every year beginning at age 50 and continuing until age 75. You may not need to do this test if you get a colonoscopy every 10 years.  Flexible sigmoidoscopy or colonoscopy.** / Every 5 years for a flexible sigmoidoscopy or every 10 years for a colonoscopy beginning at age 50 and continuing until age 75.  Hepatitis C blood test.** / For all people born from 1945 through 1965 and any individual with known risks for hepatitis C.  Osteoporosis screening.** / A one-time screening for women ages 65 and over and women at risk for fractures or osteoporosis.  Skin self-exam. / Monthly.  Influenza immunization.** / Every year.  Pneumococcal polysaccharide immunization.** / 1 dose at age 65 (or older) if you have never been vaccinated.  Tetanus, diphtheria, pertussis (Tdap, Td) immunization. / A one-time dose of Tdap vaccine if you are over   65 and have contact with an infant, are a healthcare worker, or simply want to be protected from whooping cough. After that, you need a Td booster dose every 10 years.  Varicella immunization.** / Consult your caregiver.  Meningococcal immunization.** / Consult your caregiver.  Hepatitis A immunization.** / Consult your caregiver. 2 doses, 6 to 18 months apart.  Hepatitis B immunization.** / Check with your caregiver. 3 doses, usually over 6 months. ** Family history and personal history of risk and conditions may change your caregiver's recommendations. Document Released: 02/25/2001 Document Revised: 03/24/2011  Document Reviewed: 05/27/2010 [employer] Patient Information 2014 [employer].  

## 2012-06-10 NOTE — Progress Notes (Signed)
Subjective:     Caroline Ward is a 52 y.o. female and is here for a comprehensive physical exam. The patient reports no problems.  History   Social History  . Marital Status: Single    Spouse Name: N/A    Number of Children: N/A  . Years of Education: N/A   Occupational History  . self employed-- Engineer, drilling    Social History Main Topics  . Smoking status: Never Smoker   . Smokeless tobacco: Never Used  . Alcohol Use: Yes     Comment: Occ  . Drug Use: No  . Sexually Active: Not Currently -- Female partner(s)   Other Topics Concern  . Not on file   Social History Narrative   Exercise--no   Health Maintenance  Topic Date Due  . Mammogram  04/24/2011  . Influenza Vaccine  09/13/2012  . Tetanus/tdap  10/11/2012  . Pap Smear  05/19/2014    The following portions of the patient's history were reviewed and updated as appropriate:  She  has a past medical history of Depression; Anxiety; and Migraine. She  does not have any pertinent problems on file. She  has no past surgical history on file. Her family history includes Alcohol abuse in her father; Breast cancer in an unspecified family member; COPD in her mother; Cancer in her brother and father; Dementia in her maternal aunt; Hyperlipidemia in her mother; Hypertension in her mother; Liver disease in an unspecified family member; Lung cancer in an unspecified family member; and Pancreatic cancer in her brother. She  reports that she has never smoked. She has never used smokeless tobacco. She reports that  drinks alcohol. She reports that she does not use illicit drugs. She has a current medication list which includes the following prescription(s): alprazolam, citalopram, mononessa, and naproxen. Current Outpatient Prescriptions on File Prior to Visit  Medication Sig Dispense Refill  . ALPRAZolam (XANAX) 0.25 MG tablet TAKE 1 TABLET BY MOUTH DAILY AS NEEDED FOR SLEEP  30 tablet  0  . citalopram (CELEXA) 20 MG tablet TAKE 1  TABLET BY MOUTH EVERY DAY. Office visit due  30 tablet  0  . MONONESSA 0.25-35 MG-MCG tablet TAKE 1 TABLET BY MOUTH EVERY DAY  28 tablet  0  . naproxen (NAPROSYN) 500 MG tablet TAKE 1 TABLET BY MOUTH TWICE DAILY WITH A MEAL  30 tablet  0   No current facility-administered medications on file prior to visit.   She has No Known Allergies..  Review of Systems Review of Systems  Constitutional: Negative for activity change, appetite change and fatigue.  HENT: Negative for hearing loss, congestion, tinnitus and ear discharge.  dentist -due Eyes: Negative for visual disturbance (see optho q2y-- due) Respiratory: Negative for cough, chest tightness and shortness of breath.   Cardiovascular: Negative for chest pain, palpitations and leg swelling.  Gastrointestinal: Negative for abdominal pain, diarrhea, constipation and abdominal distention.  Genitourinary: Negative for urgency, frequency, decreased urine volume and difficulty urinating.  Musculoskeletal: Negative for back pain, arthralgias and gait problem.  Skin: Negative for color change, pallor and rash.  Neurological: Negative for dizziness, light-headedness, numbness and headaches.  Hematological: Negative for adenopathy. Does not bruise/bleed easily.  Psychiatric/Behavioral: Negative for suicidal ideas, confusion, sleep disturbance, self-injury, dysphoric mood, decreased concentration and agitation.       Objective:    BP 122/76  Pulse 76  Temp(Src) 99.1 F (37.3 C) (Oral)  Ht 5\' 8"  (1.727 m)  Wt 283 lb 6.4 oz (128.549 kg)  BMI 43.1 kg/m2  SpO2 96% General appearance: alert, cooperative, appears stated age and no distress Head: Normocephalic, without obvious abnormality, atraumatic Eyes: conjunctivae/corneas clear. PERRL, EOM's intact. Fundi benign. Ears: normal TM's and external ear canals both ears Nose: Nares normal. Septum midline. Mucosa normal. No drainage or sinus tenderness. Throat: lips, mucosa, and tongue normal;  teeth and gums normal Neck: no adenopathy, supple, symmetrical, trachea midline and thyroid not enlarged, symmetric, no tenderness/mass/nodules Back: symmetric, no curvature. ROM normal. No CVA tenderness. Lungs: clear to auscultation bilaterally Breasts: normal appearance, no masses or tenderness Heart: regular rate and rhythm, S1, S2 normal, no murmur, click, rub or gallop Abdomen: soft, non-tender; bowel sounds normal; no masses,  no organomegaly Pelvic: deferred Extremities: extremities normal, atraumatic, no cyanosis or edema Pulses: 2+ and symmetric Skin: Skin color, texture, turgor normal. No rashes or lesions Lymph nodes: Cervical, supraclavicular, and axillary nodes normal. Neurologic: Alert and oriented X 3, normal strength and tone. Normal symmetric reflexes. Normal coordination and gait Psych- no anxiety, no depression      Assessment:    Healthy female exam.      Plan:     See After Visit Summary for Counseling Recommendations

## 2012-06-10 NOTE — Assessment & Plan Note (Signed)
Pt doing great with naprosyn

## 2012-06-10 NOTE — Assessment & Plan Note (Signed)
con't meds Pt doing well

## 2012-06-17 ENCOUNTER — Other Ambulatory Visit: Payer: Self-pay | Admitting: Family Medicine

## 2012-06-22 ENCOUNTER — Encounter: Payer: Self-pay | Admitting: Gastroenterology

## 2012-06-22 ENCOUNTER — Ambulatory Visit (INDEPENDENT_AMBULATORY_CARE_PROVIDER_SITE_OTHER): Payer: BC Managed Care – PPO | Admitting: Gastroenterology

## 2012-06-22 VITALS — BP 142/82 | HR 76 | Ht 67.0 in | Wt 281.2 lb

## 2012-06-22 DIAGNOSIS — Z8371 Family history of colonic polyps: Secondary | ICD-10-CM

## 2012-06-22 MED ORDER — MOVIPREP 100 G PO SOLR: 1.0000 | Freq: Once | ORAL | Status: DC

## 2012-06-22 NOTE — Progress Notes (Signed)
History of Present Illness:  This is a nice 52 year old Caucasian female who denies any GI complaints except for occasional GERD managed with over-the-counter antacids.  She her mother and her sister both have colon polyps, but apparently no cancer.  Her father apparently had lung cancer metastatic to the esophagus and liver.  Her brother died at age 35 of pancreatic cancer.  Patient has regular bowel movements without melena, hematochezia, abdominal pain, or any specific upper GI or hepatobiliary complaints.  Medications include daily Naprosyn for myalgias.  There is no history of known gallbladder liver disease.  She has not had previous endoscopy or colonoscopy.  I have reviewed this patient's present history, medical and surgical past history, allergies and medications.     ROS:   All systems were reviewed and are negative unless otherwise stated in the HPI.    Physical Exam: Blood pressure 142/82, pulse 76 and regular and weight 281 with a BMI of 44.04. General well developed well nourished patient in no acute distress, appearing their stated age Eyes PERRLA, no icterus, fundoscopic exam per opthamologist Skin no lesions noted Neck supple, no adenopathy, no thyroid enlargement, no tenderness Chest clear to percussion and auscultation Heart no significant murmurs, gallops or rubs noted Abdomen no hepatosplenomegaly masses or tenderness, BS normal.  Extremities no acute joint lesions, edema, phlebitis or evidence of cellulitis. Neurologic patient oriented x 3, cranial nerves intact, no focal neurologic deficits noted. Psychological mental status normal and normal affect.  Assessment and plan: I agree this patient needs screening colonoscopy per her family history of colonic polyposis and her age.  We have scheduled this at her convenience.  I have reviewed the risk and benefits of colonoscopy and polypectomy in detail.  She does suffer from migraine headaches and obesity, but otherwise is  healthy.  No diagnosis found.

## 2012-06-22 NOTE — Patient Instructions (Signed)

## 2012-06-30 ENCOUNTER — Encounter: Payer: BC Managed Care – PPO | Admitting: Gastroenterology

## 2012-07-05 ENCOUNTER — Encounter: Payer: Self-pay | Admitting: Family Medicine

## 2012-12-01 ENCOUNTER — Other Ambulatory Visit: Payer: Self-pay | Admitting: Family Medicine

## 2012-12-01 NOTE — Telephone Encounter (Signed)
Last seen and filled 06/10/12 330. Please advise      KP

## 2013-02-10 ENCOUNTER — Ambulatory Visit (HOSPITAL_BASED_OUTPATIENT_CLINIC_OR_DEPARTMENT_OTHER)
Admission: RE | Admit: 2013-02-10 | Discharge: 2013-02-10 | Disposition: A | Discharge status: Home / Self Care | Payer: BC Managed Care – PPO | Source: Ambulatory Visit | Attending: Family Medicine | Admitting: Family Medicine

## 2013-02-10 ENCOUNTER — Ambulatory Visit (INDEPENDENT_AMBULATORY_CARE_PROVIDER_SITE_OTHER): Payer: BC Managed Care – PPO | Admitting: Family Medicine

## 2013-02-10 ENCOUNTER — Encounter: Payer: Self-pay | Admitting: Family Medicine

## 2013-02-10 VITALS — BP 140/82 | HR 77 | Temp 98.6°F | Wt 286.0 lb

## 2013-02-10 DIAGNOSIS — R059 Cough, unspecified: Secondary | ICD-10-CM | POA: Insufficient documentation

## 2013-02-10 DIAGNOSIS — J209 Acute bronchitis, unspecified: Secondary | ICD-10-CM

## 2013-02-10 DIAGNOSIS — R062 Wheezing: Secondary | ICD-10-CM

## 2013-02-10 DIAGNOSIS — R05 Cough: Secondary | ICD-10-CM | POA: Insufficient documentation

## 2013-02-10 MED ORDER — ALBUTEROL SULFATE HFA 108 (90 BASE) MCG/ACT IN AERS: 2.0000 | INHALATION_SPRAY | Freq: Four times a day (QID) | RESPIRATORY_TRACT | Status: DC | PRN

## 2013-02-10 MED ORDER — AMOXICILLIN-POT CLAVULANATE 875-125 MG PO TABS: 1.0000 | ORAL_TABLET | Freq: Two times a day (BID) | ORAL | Status: DC

## 2013-02-10 MED ORDER — PREDNISONE 10 MG PO TABS: ORAL_TABLET | ORAL | Status: DC

## 2013-02-10 MED ORDER — HYDROCOD POLST-CHLORPHEN POLST 10-8 MG/5ML PO LQCR: 5.0000 mL | Freq: Every evening | ORAL | Status: DC | PRN

## 2013-02-10 MED ORDER — METHYLPREDNISOLONE ACETATE 80 MG/ML IJ SUSP
80.0000 mg | Freq: Once | INTRAMUSCULAR | Status: AC
  Administered 2013-02-10: 80 mg via INTRAMUSCULAR

## 2013-02-10 MED ORDER — ALBUTEROL SULFATE (2.5 MG/3ML) 0.083% IN NEBU
2.5000 mg | INHALATION_SOLUTION | Freq: Once | RESPIRATORY_TRACT | Status: AC
  Administered 2013-02-10: 2.5 mg via RESPIRATORY_TRACT

## 2013-02-10 NOTE — Patient Instructions (Signed)

## 2013-02-10 NOTE — Progress Notes (Signed)
Pre visit review using our clinic review tool, if applicable. No additional management support is needed unless otherwise documented below in the visit note. 

## 2013-02-10 NOTE — Progress Notes (Signed)
  Subjective:     Caroline Ward is a 53 y.o. female here for evaluation of a cough. Onset of symptoms was 2 months ago. Symptoms have been gradually worsening since that time. The cough is productive and is aggravated by cold air, infection and reclining position. Associated symptoms include: postnasal drip, shortness of breath, sputum production and wheezing. Patient does not have a history of asthma. Patient does not have a history of environmental allergens. Patient has not traveled recently. Patient does not have a history of smoking. Patient has not had a previous chest x-ray. Patient has not had a PPD done.  The following portions of the patient's history were reviewed and updated as appropriate: allergies, current medications, past family history, past medical history, past social history, past surgical history and problem list.  Review of Systems Pertinent items are noted in HPI.    Objective:    Oxygen saturation 95% on room air BP 140/82  Pulse 77  Temp(Src) 98.6 F (37 C) (Oral)  Wt 286 lb (129.729 kg)  SpO2 95% General appearance: alert, cooperative, appears stated age and no distress Ears: normal TM's and external ear canals both ears Nose: Nares normal. Septum midline. Mucosa normal. No drainage or sinus tenderness. Throat: lips, mucosa, and tongue normal; teeth and gums normal Neck: no adenopathy and supple, symmetrical, trachea midline Lungs: diminished breath sounds bilaterally, rales bilaterally and wheezes bilaterally Heart: S1, S2 normal Extremities: extremities normal, atraumatic, no cyanosis or edema    Assessment:    Acute Bronchitis   R/o pneumonia   Plan:    Antibiotics per medication orders. Antitussives per medication orders. Avoid exposure to tobacco smoke and fumes. B-agonist inhaler. Call if shortness of breath worsens, blood in sputum, change in character of cough, development of fever or chills, inability to maintain nutrition and hydration. Avoid  exposure to tobacco smoke and fumes. Chest x-ray. steroid taper and depo medrol

## 2013-02-17 ENCOUNTER — Ambulatory Visit (INDEPENDENT_AMBULATORY_CARE_PROVIDER_SITE_OTHER): Payer: BC Managed Care – PPO | Admitting: Family Medicine

## 2013-02-17 ENCOUNTER — Encounter: Payer: Self-pay | Admitting: Family Medicine

## 2013-02-17 VITALS — BP 160/88 | HR 81 | Temp 97.7°F | Wt 285.0 lb

## 2013-02-17 DIAGNOSIS — E669 Obesity, unspecified: Secondary | ICD-10-CM | POA: Insufficient documentation

## 2013-02-17 DIAGNOSIS — R0602 Shortness of breath: Secondary | ICD-10-CM

## 2013-02-17 DIAGNOSIS — J45909 Unspecified asthma, uncomplicated: Secondary | ICD-10-CM

## 2013-02-17 MED ORDER — MOMETASONE FURO-FORMOTEROL FUM 100-5 MCG/ACT IN AERO: INHALATION_SPRAY | RESPIRATORY_TRACT | Status: DC

## 2013-02-17 NOTE — Progress Notes (Signed)
  Subjective:     Caroline Ward is a 53 y.o. female here for f/u of a cough. Onset of symptoms was prior to last visit. Symptoms have been gradually improving since that time. The cough is barky and is aggravated by cold air and reclining position. Associated symptoms include: shortness of breath and wheezing. Patient does not have a history of asthma. Patient does not have a history of environmental allergens. Patient has not traveled recently. Patient does not have a history of smoking. Patient has had a previous chest x-ray. Patient has had a PPD done.  The following portions of the patient's history were reviewed and updated as appropriate: allergies, current medications, past family history, past medical history, past social history, past surgical history and problem list.  Review of Systems Pertinent items are noted in HPI.    Objective:    Oxygen saturation 98% on room air BP 160/88  Pulse 81  Temp(Src) 97.7 F (36.5 C) (Oral)  Wt 285 lb (129.275 kg)  SpO2 98% General appearance: alert, cooperative, appears stated age and no distress Ears: normal TM's and external ear canals both ears Nose: Nares normal. Septum midline. Mucosa normal. No drainage or sinus tenderness. Throat: lips, mucosa, and tongue normal; teeth and gums normal Neck: no adenopathy, supple, symmetrical, trachea midline and thyroid not enlarged, symmetric, no tenderness/mass/nodules Lungs: diminished breath sounds bilaterally Heart: S1, S2 normal    Assessment:    bronchospasm   - Spirometry-- mild restriction   Plan:    Explained lack of efficacy of antibiotics in viral disease. Antitussives per medication orders. Avoid exposure to tobacco smoke and fumes. B-agonist inhaler. Call if shortness of breath worsens, blood in sputum, change in character of cough, development of fever or chills, inability to maintain nutrition and hydration. Avoid exposure to tobacco smoke and fumes. Steroid inhaler as ordered.

## 2013-02-17 NOTE — Progress Notes (Signed)
Pre visit review using our clinic review tool, if applicable. No additional management support is needed unless otherwise documented below in the visit note. 

## 2013-02-17 NOTE — Patient Instructions (Signed)
Asthma, Adult Asthma is a recurring condition in which the airways tighten and narrow. Asthma can make it difficult to breathe. It can cause coughing, wheezing, and shortness of breath. Asthma episodes (also called asthma attacks) range from minor to life-threatening. Asthma cannot be cured, but medicines and lifestyle changes can help control it. CAUSES Asthma is believed to be caused by inherited (genetic) and environmental factors, but its exact cause is unknown. Asthma may be triggered by allergens, lung infections, or irritants in the air. Asthma triggers are different for each person. Common triggers include:   Animal dander.  Dust mites.  Cockroaches.  Pollen from trees or grass.  Mold.  Smoke.  Air pollutants such as dust, household cleaners, hair sprays, aerosol sprays, paint fumes, strong chemicals, or strong odors.  Cold air, weather changes, and winds (which increase molds and pollens in the air).  Strong emotional expressions such as crying or laughing hard.  Stress.  Certain medicines (such as aspirin) or types of drugs (such as beta-blockers).  Sulfites in foods and drinks. Foods and drinks that may contain sulfites include dried fruit, potato chips, and sparkling grape juice.  Infections or inflammatory conditions such as the flu, a cold, or an inflammation of the nasal membranes (rhinitis).  Gastroesophageal reflux disease (GERD).  Exercise or strenuous activity. SYMPTOMS Symptoms may occur immediately after asthma is triggered or many hours later. Symptoms include:  Wheezing.  Excessive nighttime or early morning coughing.  Frequent or severe coughing with a common cold.  Chest tightness.  Shortness of breath. DIAGNOSIS  The diagnosis of asthma is made by a review of your medical history and a physical exam. Tests may also be performed. These may include:  Lung function studies. These tests show how much air you breath in and out.  Allergy  tests.  Imaging tests such as X-rays. TREATMENT  Asthma cannot be cured, but it can usually be controlled. Treatment involves identifying and avoiding your asthma triggers. It also involves medicines. There are 2 classes of medicine used for asthma treatment:   Controller medicines. These prevent asthma symptoms from occurring. They are usually taken every day.  Reliever or rescue medicines. These quickly relieve asthma symptoms. They are used as needed and provide short-term relief. Your health care provider will help you create an asthma action plan. An asthma action plan is a written plan for managing and treating your asthma attacks. It includes a list of your asthma triggers and how they may be avoided. It also includes information on when medicines should be taken and when their dosage should be changed. An action plan may also involve the use of a device called a peak flow meter. A peak flow meter measures how well the lungs are working. It helps you monitor your condition. HOME CARE INSTRUCTIONS   Take medicine as directed by your health care provider. Speak with your health care provider if you have questions about how or when to take the medicines.  Use a peak flow meter as directed by your health care provider. Record and keep track of readings.  Understand and use the action plan to help minimize or stop an asthma attack without needing to seek medical care.  Control your home environment in the following ways to help prevent asthma attacks:  Do not smoke. Avoid being exposed to secondhand smoke.  Change your heating and air conditioning filter regularly.  Limit your use of fireplaces and wood stoves.  Get rid of pests (such as roaches and   mice) and their droppings.  Throw away plants if you see mold on them.  Clean your floors and dust regularly. Use unscented cleaning products.  Try to have someone else vacuum for you regularly. Stay out of rooms while they are being  vacuumed and for a short while afterward. If you vacuum, use a dust mask from a hardware store, a double-layered or microfilter vacuum cleaner bag, or a vacuum cleaner with a HEPA filter.  Replace carpet with wood, tile, or vinyl flooring. Carpet can trap dander and dust.  Use allergy-proof pillows, mattress covers, and box spring covers.  Wash bed sheets and blankets every week in hot water and dry them in a dryer.  Use blankets that are made of polyester or cotton.  Clean bathrooms and kitchens with bleach. If possible, have someone repaint the walls in these rooms with mold-resistant paint. Keep out of the rooms that are being cleaned and painted.  Wash hands frequently. SEEK MEDICAL CARE IF:   You have wheezing, shortness of breath, or a cough even if taking medicine to prevent attacks.  The colored mucus you cough up (sputum) is thicker than usual.  Your sputum changes from clear or white to yellow, green, gray, or bloody.  You have any problems that may be related to the medicines you are taking (such as a rash, itching, swelling, or trouble breathing).  You are using a reliever medicine more than 2 3 times per week.  Your peak flow is still at 50 79% of you personal best after following your action plan for 1 hour. SEEK IMMEDIATE MEDICAL CARE IF:   You seem to be getting worse and are unresponsive to treatment during an asthma attack.  You are short of breath even at rest.  You get short of breath when doing very little physical activity.  You have difficulty eating, drinking, or talking due to asthma symptoms.  You develop chest pain.  You develop a fast heartbeat.  You have a bluish color to your lips or fingernails.  You are lightheaded, dizzy, or faint.  Your peak flow is less than 50% of your personal best.  You have a fever or persistent symptoms for more than 2 3 days.  You have a fever and symptoms suddenly get worse. MAKE SURE YOU:   Understand these  instructions.  Will watch your condition.  Will get help right away if you are not doing well or get worse. Document Released: 12/30/2004 Document Revised: 09/01/2012 Document Reviewed: 07/29/2012 [employer] Patient Information 2014 [employer].  

## 2013-02-28 ENCOUNTER — Ambulatory Visit (INDEPENDENT_AMBULATORY_CARE_PROVIDER_SITE_OTHER): Payer: BC Managed Care – PPO | Admitting: Family Medicine

## 2013-02-28 ENCOUNTER — Encounter: Payer: Self-pay | Admitting: Family Medicine

## 2013-02-28 VITALS — BP 130/70 | HR 89 | Temp 98.4°F | Wt 282.0 lb

## 2013-02-28 DIAGNOSIS — J45901 Unspecified asthma with (acute) exacerbation: Secondary | ICD-10-CM

## 2013-02-28 MED ORDER — GUAIFENESIN-CODEINE 100-10 MG/5ML PO SYRP: ORAL_SOLUTION | ORAL | Status: DC

## 2013-02-28 MED ORDER — PREDNISONE 10 MG PO TABS: ORAL_TABLET | ORAL | Status: DC

## 2013-02-28 NOTE — Progress Notes (Signed)
Pre visit review using our clinic review tool, if applicable. No additional management support is needed unless otherwise documented below in the visit note. 

## 2013-02-28 NOTE — Progress Notes (Signed)
  Subjective:     Caroline Ward is a 53 y.o. female here for evaluation of a cough. Onset of symptoms was a few days ago. Symptoms have been gradually worsening since that time. The cough is nonproductive and is aggravated by cold air, dust, exercise, fumes and reclining position. Associated symptoms include: shortness of breath and wheezing. Patient does have a history of asthma. Patient does have a history of environmental allergens. Patient has not traveled recently. Patient does not have a history of smoking. Patient has had a previous chest x-ray. Patient has not had a PPD done.  The following portions of the patient's history were reviewed and updated as appropriate: allergies, current medications, past family history, past medical history, past social history, past surgical history and problem list.  Review of Systems Pertinent items are noted in HPI.    Objective:    Oxygen saturation 97% on room air BP 130/70  Pulse 89  Temp(Src) 98.4 F (36.9 C) (Oral)  Wt 282 lb (127.914 kg)  SpO2 97% General appearance: alert, cooperative, appears stated age and no distress Neck: no adenopathy, supple, symmetrical, trachea midline and thyroid not enlarged, symmetric, no tenderness/mass/nodules Lungs: diminished breath sounds bilaterally and wheezes bilaterally Heart: S1, S2 normal Extremities: extremities normal, atraumatic, no cyanosis or edema    Assessment:    Asthma    Plan:    Explained lack of efficacy of antibiotics in viral disease. Antitussives per medication orders. Avoid exposure to tobacco smoke and fumes. B-agonist inhaler. Call if shortness of breath worsens, blood in sputum, change in character of cough, development of fever or chills, inability to maintain nutrition and hydration. Avoid exposure to tobacco smoke and fumes. Pulmonary consult. Steroid inhaler as ordered.

## 2013-02-28 NOTE — Patient Instructions (Signed)

## 2013-03-02 ENCOUNTER — Encounter: Payer: Self-pay | Admitting: Family Medicine

## 2013-03-16 ENCOUNTER — Ambulatory Visit (INDEPENDENT_AMBULATORY_CARE_PROVIDER_SITE_OTHER): Payer: BC Managed Care – PPO | Admitting: Internal Medicine

## 2013-03-16 ENCOUNTER — Institutional Professional Consult (permissible substitution): Payer: BC Managed Care – PPO | Admitting: Internal Medicine

## 2013-03-16 ENCOUNTER — Encounter: Payer: Self-pay | Admitting: Internal Medicine

## 2013-03-16 VITALS — BP 130/88 | HR 90 | Temp 98.4°F | Ht 68.0 in | Wt 287.0 lb

## 2013-03-16 DIAGNOSIS — J45901 Unspecified asthma with (acute) exacerbation: Secondary | ICD-10-CM

## 2013-03-16 DIAGNOSIS — R059 Cough, unspecified: Secondary | ICD-10-CM

## 2013-03-16 DIAGNOSIS — R05 Cough: Secondary | ICD-10-CM

## 2013-03-16 MED ORDER — PANTOPRAZOLE SODIUM 40 MG PO TBEC: 40.0000 mg | DELAYED_RELEASE_TABLET | Freq: Every day | ORAL | Status: DC

## 2013-03-16 MED ORDER — GUAIFENESIN-CODEINE 100-10 MG/5ML PO SYRP
ORAL_SOLUTION | ORAL | Status: DC
Start: 2013-03-16 — End: 2013-05-31

## 2013-03-16 NOTE — Patient Instructions (Addendum)
Pantoprazole (protonix) 40 mg   Take 30-60 min before first meal of the day and Pepcid 20 mg one bedtime until return to office - this is the best way to tell whether stomach acid is contributing to your problem.    Chloretrimeton 4 mg one after supper and one at bedtime   GERD (REFLUX)  is an extremely common cause of respiratory symptoms, many times with no significant heartburn at all.    It can be treated with medication, but also with lifestyle changes including avoidance of late meals, excessive alcohol, smoking cessation, and avoid fatty foods, chocolate, peppermint, colas, red wine, and acidic juices such as orange juice.  NO MINT OR MENTHOL PRODUCTS SO NO COUGH DROPS  USE SUGARLESS CANDY INSTEAD (jolley ranchers or Stover's)  NO OIL BASED VITAMINS - use powdered substitutes.    If needed, add the cough medication back x 72 hours   If not better in 2 weeks please return

## 2013-03-16 NOTE — Progress Notes (Signed)
   Subjective:    Patient ID: Caroline Ward, female    DOB: 02-18-1960  MRN: [id number]  HPI  22 yowf never smoker never allergies since around 2005pattern of recurrent cough typically  at end Dec / early Jan assoc first with  with uri throat/sinus symptoms then cough lasts a few weeks, better p abx but 01/2013 onset same but never better so referred by Dr Etter Sjogren 03/16/2013 for pulmonary eval for ? Asthma.   03/16/2013 1st [facility] office visit/ Caylie Sandquist  Chief Complaint  Patient presents with  . Pulmonary Consult    Referred by Dr. Garnet Koyanagi for asthma  better p flare of cough/ wheeze in Jan 2015 worse after supper and at bedtime and better p prednisone and cough suppression.  No excess or purulent sputum. No better p prednisone, min assoc sob during coughing fits mostly   No obvious other patterns in day to day or daytime variabilty or assoc cp  Or subjective wheeze overt sinus or hb symptoms. No unusual exp hx or h/o childhood pna/ asthma or knowledge of premature birth.  Sleeping ok without nocturnal  or early am exacerbation  of respiratory  c/o's or need for noct saba. Also denies any obvious fluctuation of symptoms with weather or environmental changes or other aggravating or alleviating factors except as outlined above   Current Medications, Allergies, Complete Past Medical History, Past Surgical History, Family History, and Social History were reviewed in [employer] record.             Review of Systems  Constitutional: Negative for fever and unexpected weight change.  HENT: Positive for postnasal drip. Negative for congestion, dental problem, ear pain, nosebleeds, rhinorrhea, sinus pressure, sneezing, sore throat and trouble swallowing.   Eyes: Negative for redness and itching.  Respiratory: Positive for cough, chest tightness, shortness of breath and wheezing.   Cardiovascular: Positive for palpitations. Negative for leg swelling.   Gastrointestinal: Negative for nausea and vomiting.  Genitourinary: Negative for dysuria.  Musculoskeletal: Negative for joint swelling.  Skin: Negative for rash.  Neurological: Negative for headaches.  Hematological: Does not bruise/bleed easily.  Psychiatric/Behavioral: Positive for dysphoric mood. The patient is nervous/anxious.        Objective:   Physical Exam  amb obese wf nad Wt Readings from Last 3 Encounters:  03/16/13 287 lb (130.182 kg)  02/28/13 282 lb (127.914 kg)  02/17/13 285 lb (129.275 kg)     HEENT: nl dentition, turbinates, and orophanx. Nl external ear canals without cough reflex   NECK :  without JVD/Nodes/TM/ nl carotid upstrokes bilaterally   LUNGS: no acc muscle use, clear to A and P bilaterally without cough on insp or exp maneuvers   CV:  RRR  no s3  II/VI  or increase in P2, no edema   ABD:  soft and nontender with nl excursion in the supine position. No bruits or organomegaly, bowel sounds nl  MS:  warm without deformities, calf tenderness, cyanosis or clubbing  SKIN: warm and dry without lesions    NEURO:  alert, approp, no deficits    cxr 02/10/13 No active cardiopulmonary disease       Assessment & Plan:

## 2013-03-18 DIAGNOSIS — R058 Other specified cough: Secondary | ICD-10-CM | POA: Insufficient documentation

## 2013-03-18 DIAGNOSIS — R05 Cough: Secondary | ICD-10-CM | POA: Insufficient documentation

## 2013-03-18 NOTE — Assessment & Plan Note (Signed)
The most common causes of chronic cough in immunocompetent adults include the following: upper airway cough syndrome (UACS), previously referred to as postnasal drip syndrome (PNDS), which is caused by variety of rhinosinus conditions; (2) asthma; (3) GERD; (4) chronic bronchitis from cigarette smoking or other inhaled environmental irritants; (5) nonasthmatic eosinophilic bronchitis; and (6) bronchiectasis.   These conditions, singly or in combination, have accounted for up to 94% of the causes of chronic cough in prospective studies.   Other conditions have constituted no >6% of the causes in prospective studies These have included bronchogenic carcinoma, chronic interstitial pneumonia, sarcoidosis, left ventricular failure, ACEI-induced cough, and aspiration from a condition associated with pharyngeal dysfunction.    Chronic cough is often simultaneously caused by more than one condition. A single cause has been found from 38 to 82% of the time, multiple causes from 18 to 62%. Multiply caused cough has been the result of three diseases up to 42% of the time.       Most likely this is  Classic Upper airway cough syndrome, so named because it's frequently impossible to sort out how much is  CR/sinusitis with freq throat clearing (which can be related to primary GERD)   vs  causing  secondary (" extra esophageal")  GERD from wide swings in gastric pressure that occur with throat clearing, often  promoting self use of mint and menthol lozenges that reduce the lower esophageal sphincter tone and exacerbate the problem further in a cyclical fashion.   These are the same pts (now being labeled as having "irritable larynx syndrome" by some cough centers) who not infrequently have a history of having failed to tolerate ace inhibitors,  dry powder inhalers or biphosphonates or report having atypical reflux symptoms that don't respond to standard doses of PPI , and are easily confused as having aecopd or asthma  flares by even experienced allergists/ pulmonologists.   She could still have asthma as well since does better p prednisone but for now rec max rx directed at gerd/pnds and see if cough resolves and if not next step should probably be to do methacholine challenge study.  See instructions for specific recommendations which were reviewed directly with the patient who was given a copy with highlighter outlining the key components.

## 2013-05-15 ENCOUNTER — Other Ambulatory Visit: Payer: Self-pay | Admitting: Family Medicine

## 2013-05-26 ENCOUNTER — Other Ambulatory Visit: Payer: Self-pay | Admitting: Family Medicine

## 2013-05-31 ENCOUNTER — Encounter: Payer: Self-pay | Admitting: Internal Medicine

## 2013-05-31 ENCOUNTER — Ambulatory Visit (INDEPENDENT_AMBULATORY_CARE_PROVIDER_SITE_OTHER): Payer: BC Managed Care – PPO | Admitting: Internal Medicine

## 2013-05-31 ENCOUNTER — Ambulatory Visit: Payer: BC Managed Care – PPO | Admitting: Internal Medicine

## 2013-05-31 VITALS — BP 132/88 | HR 80 | Temp 98.4°F | Ht 68.0 in | Wt 295.0 lb

## 2013-05-31 DIAGNOSIS — R058 Other specified cough: Secondary | ICD-10-CM

## 2013-05-31 DIAGNOSIS — R05 Cough: Secondary | ICD-10-CM

## 2013-05-31 DIAGNOSIS — J45901 Unspecified asthma with (acute) exacerbation: Secondary | ICD-10-CM

## 2013-05-31 DIAGNOSIS — R0602 Shortness of breath: Secondary | ICD-10-CM

## 2013-05-31 DIAGNOSIS — R059 Cough, unspecified: Secondary | ICD-10-CM

## 2013-05-31 MED ORDER — GUAIFENESIN-CODEINE 100-10 MG/5ML PO SYRP: ORAL_SOLUTION | ORAL | Status: DC

## 2013-05-31 MED ORDER — PREDNISONE 10 MG PO TABS: ORAL_TABLET | ORAL | Status: DC

## 2013-05-31 MED ORDER — FAMOTIDINE 20 MG PO TABS: ORAL_TABLET | ORAL | Status: DC

## 2013-05-31 NOTE — Progress Notes (Signed)
Subjective:    Patient ID: Caroline Ward, female    DOB: 03/09/1960  MRN: [id number]   Brief patient profile:  19 yowf never smoker never allergies since around 2005 pattern of recurrent cough typically  at end Dec / early Jan assoc first with  with uri throat/sinus symptoms then cough lasts a few weeks, better p abx but 01/2013 onset same but never better so referred by Dr Etter Sjogren 03/16/2013 for pulmonary eval for ? Asthma.    History of Present Illness  03/16/2013 1st [facility] office visit/ Kamdin Follett  Chief Complaint  Patient presents with  . Pulmonary Consult    Referred by Dr. Garnet Koyanagi for asthma  better p flare of cough/ wheeze in Jan 2015 worse after supper and at bedtime and better p prednisone and cough suppression.  No excess or purulent sputum. No better p prednisone, min assoc sob during coughing fits mostly  rec Pantoprazole (protonix) 40 mg   Take 30-60 min before first meal of the day and Pepcid 20 mg one bedtime until return to office - this is the best way to tell whether stomach acid is contributing to your problem.   Chloretrimeton 4 mg one after supper and one at bedtime  GERD diet  If needed, add the cough medication back x 72 hours    05/31/2013 f/u ov/Ngan Qualls re:  Chronic cough  Chief Complaint  Patient presents with  . Acute Visit    Presents today with increased SOB with coughing. Onset was 1 week ago. Cough is productive at times.  100% better but   Breathing-  never been 100% since dec 2014 (when attempted new /higher level of aerobic activity) Now daily sob with walking across a big parking lot  Cough worse hs Slacked off gerd/h1 mid April and worse since then No better on saba to date  No obvious day to day or daytime variabilty or assoc  cp or chest tightness, subjective wheeze overt sinus or hb symptoms. No unusual exp hx or h/o childhood pna/ asthma or knowledge of premature birth.  Sleeping ok without nocturnal  or early am exacerbation  of  respiratory  c/o's or need for noct saba. Also denies any obvious fluctuation of symptoms with weather or environmental changes or other aggravating or alleviating factors except as outlined above   Current Medications, Allergies, Complete Past Medical History, Past Surgical History, Family History, and Social History were reviewed in [employer] record.  ROS  The following are not active complaints unless bolded sore throat, dysphagia, dental problems, itching, sneezing,  nasal congestion or excess/ purulent secretions, ear ache,   fever, chills, sweats, unintended wt loss, pleuritic or exertional cp, hemoptysis,  orthopnea pnd or leg swelling, presyncope, palpitations, heartburn, abdominal pain, anorexia, nausea, vomiting, diarrhea  or change in bowel or urinary habits, change in stools or urine, dysuria,hematuria,  rash, arthralgias, visual complaints, headache, numbness weakness or ataxia or problems with walking or coordination,  change in mood/affect or memory.                           Objective:   Physical Exam  amb obese wf nad  05/31/2013        295  Wt Readings from Last 3 Encounters:  03/16/13 287 lb (130.182 kg)  02/28/13 282 lb (127.914 kg)  02/17/13 285 lb (129.275 kg)     HEENT: nl dentition, turbinates, and orophanx. Nl external ear canals  without cough reflex   NECK :  without JVD/Nodes/TM/ nl carotid upstrokes bilaterally   LUNGS: no acc muscle use, clear to A and P bilaterally without cough on insp or exp maneuvers   CV:  RRR  no s3  II/VI  or increase in P2, no edema   ABD:  soft and nontender with nl excursion in the supine position. No bruits or organomegaly, bowel sounds nl  MS:  warm without deformities, calf tenderness, cyanosis or clubbing  SKIN: warm and dry without lesions    NEURO:  alert, approp, no deficits    cxr 02/10/13 No active cardiopulmonary disease       Assessment & Plan:

## 2013-05-31 NOTE — Patient Instructions (Addendum)
Pantoprazole (protonix) 40 mg   Take 30-60 min before first meal of the day and Pepcid 20 mg one bedtime x 3 months minimal  Chloretrimeton (chlorpheriamine)  4 mg one after supper and one at bedtime as long as coughing at all  Prednisone 10 mg take  4 each am x 2 days,   2 each am x 2 days,  1 each am x 2 days and stop   For cough take robitussin AC up 2 tsp every 4 hours   For breathing > proair up to every 4 hour if needed   GERD (REFLUX)  is an extremely common cause of respiratory symptoms, many times with no significant heartburn at all.    It can be treated with medication, but also with lifestyle changes including avoidance of late meals, excessive alcohol, smoking cessation, and avoid fatty foods, chocolate, peppermint, colas, red wine, and acidic juices such as orange juice.  NO MINT OR MENTHOL PRODUCTS SO NO COUGH DROPS  USE SUGARLESS CANDY INSTEAD (jolley ranchers or Stover's)  NO OIL BASED VITAMINS - use powdered substitutes.  Return in 2 weeks if not better

## 2013-06-01 NOTE — Assessment & Plan Note (Signed)
Since cough flared p stopped rx this strongly supports dx of  Classic Upper airway cough syndrome, so named because it's frequently impossible to sort out how much is  CR/sinusitis with freq throat clearing (which can be related to primary GERD)   vs  causing  secondary (" extra esophageal")  GERD from wide swings in gastric pressure that occur with throat clearing, often  promoting self use of mint and menthol lozenges that reduce the lower esophageal sphincter tone and exacerbate the problem further in a cyclical fashion.   These are the same pts (now being labeled as having "irritable larynx syndrome" by some cough centers) who not infrequently have a history of having failed to tolerate ace inhibitors,  dry powder inhalers or biphosphonates or report having atypical reflux symptoms that don't respond to standard doses of PPI , and are easily confused as having aecopd or asthma flares by even experienced allergists/ pulmonologists.  Will rechallenge  With max gerd/diet/ h1 regimen and see if still perceives need for saba and if so next step is methacholine challenge to rule out cough variant asthma and in meantime just use saba prn  See instructions for specific recommendations which were reviewed directly with the patient who was given a copy with highlighter outlining the key components.

## 2013-06-13 ENCOUNTER — Encounter: Payer: Self-pay | Admitting: Internal Medicine

## 2013-06-13 ENCOUNTER — Other Ambulatory Visit (INDEPENDENT_AMBULATORY_CARE_PROVIDER_SITE_OTHER): Payer: BC Managed Care – PPO

## 2013-06-13 ENCOUNTER — Ambulatory Visit (INDEPENDENT_AMBULATORY_CARE_PROVIDER_SITE_OTHER): Payer: BC Managed Care – PPO | Admitting: Internal Medicine

## 2013-06-13 VITALS — BP 120/74 | HR 94 | Temp 98.5°F | Ht 69.0 in | Wt 296.0 lb

## 2013-06-13 DIAGNOSIS — J45991 Cough variant asthma: Secondary | ICD-10-CM

## 2013-06-13 LAB — CBC WITH DIFFERENTIAL/PLATELET
BASOS ABS: 0.1 10*3/uL (ref 0.0–0.1)
Basophils Relative: 0.5 % (ref 0.0–3.0)
EOS ABS: 0.6 10*3/uL (ref 0.0–0.7)
Eosinophils Relative: 4.9 % (ref 0.0–5.0)
HCT: 39.8 % (ref 36.0–46.0)
Hemoglobin: 13.3 g/dL (ref 12.0–15.0)
Lymphocytes Relative: 22.9 % (ref 12.0–46.0)
Lymphs Abs: 2.9 10*3/uL (ref 0.7–4.0)
MCHC: 33.3 g/dL (ref 30.0–36.0)
MCV: 84.2 fl (ref 78.0–100.0)
Monocytes Absolute: 0.8 10*3/uL (ref 0.1–1.0)
Monocytes Relative: 6.2 % (ref 3.0–12.0)
NEUTROS ABS: 8.2 10*3/uL — AB (ref 1.4–7.7)
NEUTROS PCT: 65.5 % (ref 43.0–77.0)
Platelets: 304 10*3/uL (ref 150.0–400.0)
RBC: 4.73 Mil/uL (ref 3.87–5.11)
RDW: 13.1 % (ref 11.5–15.5)
WBC: 12.5 10*3/uL — ABNORMAL HIGH (ref 4.0–10.5)

## 2013-06-13 MED ORDER — PREDNISONE 10 MG PO TABS: ORAL_TABLET | ORAL | Status: DC

## 2013-06-13 MED ORDER — BECLOMETHASONE DIPROPIONATE 80 MCG/ACT IN AERS: 2.0000 | INHALATION_SPRAY | Freq: Two times a day (BID) | RESPIRATORY_TRACT | Status: DC

## 2013-06-13 NOTE — Patient Instructions (Addendum)
Prednisone 10 mg take  4 each am x 2 days,   2 each am x 2 days,  1 each am x 2 days and stop   qvar 80 Take 2 puffs first thing in am and then another 2 puffs about 12 hours later.    Please see patient coordinator before you leave today  to schedule sinus CT   Please remember to go to the lab   department downstairs for your tests - we will call you with the results when they are available.  For cough > robitussin AC 2 tsp every 4 hours as needed  For short of breath > proair 2 every 4 hours as needed   Continue to suppress stomach  acid and nasal drainage as before   Please schedule a follow up office visit in 4 weeks, sooner if needed

## 2013-06-13 NOTE — Assessment & Plan Note (Addendum)
Lack of cough resolution on a verified empirical regimen could mean an alternative diagnosis, persistence of the disease state (eg sinusitis or bronchiectasis) , or inadequacy of currently available therapy (eg no medical rx available for non-acid gerd)   The prednisone response is most c/w cough variant asthma but eos rhinitis/ bronchitis also in DDX  ? Sinusitis > sinus ct next   ? Allergic component > allergy profile plus repeat prednisone x 6 days but this time add qvar 80 2bid as maint rx  The proper method of use, as well as anticipated side effects, of a metered-dose inhaler are discussed and demonstrated to the patient. Improved effectiveness after extensive coaching during this visit to a level of approximately  90%

## 2013-06-13 NOTE — Progress Notes (Signed)
Subjective:    Patient ID: ANNELIE BOAK, female    DOB: January 22, 1960  MRN: [id number]   Brief patient profile:  80 yowf never smoker never allergies since around 2005 pattern of recurrent cough typically  at end Dec / early Jan assoc first with  with uri throat/sinus symptoms then cough lasts a few weeks, better p abx but 12/2012 onset same but never better so referred by Dr Etter Sjogren 03/16/2013 for pulmonary eval for ? Asthma.    History of Present Illness  03/16/2013 1st [facility] office visit/ Ellowyn Rieves  Chief Complaint  Patient presents with  . Pulmonary Consult    Referred by Dr. Garnet Koyanagi for asthma  better p flare of cough/ wheeze in Jan 2015 worse after supper and at bedtime and better p prednisone and cough suppression.  No excess or purulent sputum. No better p prednisone, min assoc sob during coughing fits mostly  rec Pantoprazole (protonix) 40 mg   Take 30-60 min before first meal of the day and Pepcid 20 mg one bedtime until return to office - this is the best way to tell whether stomach acid is contributing to your problem.   Chloretrimeton 4 mg one after supper and one at bedtime  GERD diet  If needed, add the cough medication back x 72 hours    05/31/2013 f/u ov/Laycie Schriner re:  Chronic cough  Chief Complaint  Patient presents with  . Acute Visit    Presents today with increased SOB with coughing. Onset was 1 week ago. Cough is productive at times.  100% better cough but   Breathing-  never been 100% since dec 2014 (when attempted new /higher level of aerobic activity) Now daily sob with walking across a big parking lot  Cough worse hs Slacked off gerd/h1 mid April and worse since then No better on saba to date rec Pantoprazole (protonix) 40 mg   Take 30-60 min before first meal of the day and Pepcid 20 mg one bedtime x 3 months minimal  Chloretrimeton (chlorpheriamine)  4 mg one after supper and one at bedtime as long as coughing at all Prednisone 10 mg take  4 each am  x 2 days,   2 each am x 2 days,  1 each am x 2 days and stop  For cough take robitussin AC up 2 tsp every 4 hours  For breathing > proair up to every 4 hour if needed  GERD diet   06/13/2013 f/u ov/Syre Knerr re: cough since Dec 2014, only better on pred, no resp to gerd/h1/saba Chief Complaint  Patient presents with  . Follow-up    Pt states cough and SOB improved on pred, but then returned once she finished med. Her cough is prod with minimal yellow/brown sputum. She has not needed rescue inhaler.    100% resolution from the very first dose of prednisone with no need for saba/ no codeine and maint on acid Rx but downhill since the last dose with all the same symptoms most bothersome at hs cough but not much sob over baseline   No obvious day to day or daytime variabilty or assoc  cp or chest tightness, subjective wheeze overt sinus or hb symptoms. No unusual exp hx or h/o childhood pna/ asthma or knowledge of premature birth.  Sleeping ok without nocturnal  or early am exacerbation  of respiratory  c/o's or need for noct saba. Also denies any obvious fluctuation of symptoms with weather or environmental changes or other aggravating or  alleviating factors except as outlined above   Current Medications, Allergies, Complete Past Medical History, Past Surgical History, Family History, and Social History were reviewed in [employer] record.  ROS  The following are not active complaints unless bolded sore throat, dysphagia, dental problems, itching, sneezing,  nasal congestion or excess/ purulent secretions, ear ache,   fever, chills, sweats, unintended wt loss, pleuritic or exertional cp, hemoptysis,  orthopnea pnd or leg swelling, presyncope, palpitations, heartburn, abdominal pain, anorexia, nausea, vomiting, diarrhea  or change in bowel or urinary habits, change in stools or urine, dysuria,hematuria,  rash, arthralgias, visual complaints, headache, numbness weakness or ataxia or  problems with walking or coordination,  change in mood/affect or memory.                           Objective:   Physical Exam  amb obese wf nad  05/31/2013        295  >  06/13/2013    Wt Readings from Last 3 Encounters:  03/16/13 287 lb (130.182 kg)  02/28/13 282 lb (127.914 kg)  02/17/13 285 lb (129.275 kg)     HEENT: nl dentition, turbinates, and orophanx. Nl external ear canals without cough reflex   NECK :  without JVD/Nodes/TM/ nl carotid upstrokes bilaterally   LUNGS: no acc muscle use, clear to A and P bilaterally without cough on insp or exp maneuvers   CV:  RRR  no s3  II/VI sem but no  increase in P2, no edema   ABD:  soft and nontender with nl excursion in the supine position. No bruits or organomegaly, bowel sounds nl  MS:  warm without deformities, calf tenderness, cyanosis or clubbing     cxr 02/10/13 No active cardiopulmonary disease       Assessment & Plan:

## 2013-06-14 ENCOUNTER — Encounter: Payer: Self-pay | Admitting: Internal Medicine

## 2013-06-14 LAB — ALLERGY PROFILE REGION II-DC, DE, MD, ~~LOC~~, VA
Allergen, D pternoyssinus,d7: <0.1 kU/L
Alternaria Alternata: 0.18 kU/L — ABNORMAL HIGH
Aspergillus fumigatus, m3: <0.1 kU/L
CAT DANDER: 0.15 kU/L — AB
CLADOSPORIUM HERBARUM: 0.16 kU/L — AB
Cockroach: <0.1 kU/L
D. farinae: <0.1 kU/L
IgE (Immunoglobulin E), Serum: 42.3 IU/mL (ref 0.0–180.0)
Johnson Grass: <0.1 kU/L
Pecan/Hickory Tree IgE: <0.1 kU/L

## 2013-06-14 NOTE — Progress Notes (Signed)
Quick Note:  LMTCB ______ 

## 2013-06-15 ENCOUNTER — Telehealth: Payer: Self-pay | Admitting: Internal Medicine

## 2013-06-15 NOTE — Telephone Encounter (Signed)
Notes Recorded by Tanda Rockers, MD on 06/14/2013 at 2:25 PM Call pt: Reviewed and minimally pos for cat dander, avoid if possible ---  I spoke with patient about results and she verbalized understanding and had no questions

## 2013-06-16 ENCOUNTER — Ambulatory Visit (INDEPENDENT_AMBULATORY_CARE_PROVIDER_SITE_OTHER)
Admission: RE | Admit: 2013-06-16 | Discharge: 2013-06-16 | Disposition: A | Discharge status: Home / Self Care | Payer: BC Managed Care – PPO | Source: Ambulatory Visit | Attending: Internal Medicine | Admitting: Internal Medicine

## 2013-06-16 DIAGNOSIS — J45991 Cough variant asthma: Secondary | ICD-10-CM

## 2013-06-16 NOTE — Progress Notes (Signed)
Quick Note:  Spoke with pt and notified of results per Dr. Wert. Pt verbalized understanding and denied any questions.  ______ 

## 2013-06-17 ENCOUNTER — Encounter: Payer: Self-pay | Admitting: Internal Medicine

## 2013-07-12 ENCOUNTER — Other Ambulatory Visit (INDEPENDENT_AMBULATORY_CARE_PROVIDER_SITE_OTHER): Payer: BC Managed Care – PPO

## 2013-07-12 ENCOUNTER — Ambulatory Visit (INDEPENDENT_AMBULATORY_CARE_PROVIDER_SITE_OTHER): Payer: BC Managed Care – PPO | Admitting: Internal Medicine

## 2013-07-12 ENCOUNTER — Other Ambulatory Visit: Payer: Self-pay | Admitting: Internal Medicine

## 2013-07-12 ENCOUNTER — Encounter: Payer: Self-pay | Admitting: Internal Medicine

## 2013-07-12 VITALS — BP 140/82 | HR 77 | Temp 97.8°F | Ht 68.0 in | Wt 298.8 lb

## 2013-07-12 DIAGNOSIS — R635 Abnormal weight gain: Secondary | ICD-10-CM | POA: Insufficient documentation

## 2013-07-12 DIAGNOSIS — R0602 Shortness of breath: Secondary | ICD-10-CM

## 2013-07-12 DIAGNOSIS — J45991 Cough variant asthma: Secondary | ICD-10-CM

## 2013-07-12 LAB — BASIC METABOLIC PANEL
BUN: 14 mg/dL (ref 6–23)
CHLORIDE: 101 meq/L (ref 96–112)
CO2: 29 mEq/L (ref 19–32)
Calcium: 8.9 mg/dL (ref 8.4–10.5)
Creatinine, Ser: 0.9 mg/dL (ref 0.4–1.2)
GFR: 73.1 mL/min (ref >60.00)
GLUCOSE: 128 mg/dL — AB (ref 70–99)
POTASSIUM: 3.8 meq/L (ref 3.5–5.1)
SODIUM: 137 meq/L (ref 135–145)

## 2013-07-12 LAB — TSH: TSH: 1.34 u[IU]/mL (ref 0.35–4.50)

## 2013-07-12 MED ORDER — PANTOPRAZOLE SODIUM 40 MG PO TBEC
40.0000 mg | DELAYED_RELEASE_TABLET | Freq: Every day | ORAL | Status: DC
Start: 2013-07-12 — End: 2013-10-14

## 2013-07-12 NOTE — Progress Notes (Signed)
PFT done today. 

## 2013-07-12 NOTE — Progress Notes (Signed)
Quick Note:  Spoke with pt and notified of results per Dr. Wert. Pt verbalized understanding and denied any questions.  ______ 

## 2013-07-12 NOTE — Progress Notes (Signed)
Subjective:    Patient ID: Caroline Ward, female    DOB: 01-23-1960  MRN: [id number]   Brief patient profile:  17 yowf never smoker never allergies since around 2005 pattern of recurrent cough typically  at end Dec / early Jan assoc first with  uri throat/sinus symptoms then cough lasts a few weeks, better p abx but 12/2012 onset same but never better so referred by Dr Etter Sjogren 03/16/2013 for pulmonary eval for ? Asthma.    History of Present Illness  03/16/2013 1st [facility] office visit/ Wert  Chief Complaint  Patient presents with  . Pulmonary Consult    Referred by Dr. Garnet Koyanagi for asthma  better p flare of cough/ wheeze in Jan 2015 worse after supper and at bedtime and better p prednisone and cough suppression.  No excess or purulent sputum. No better p prednisone, min assoc sob during coughing fits mostly  rec Pantoprazole (protonix) 40 mg   Take 30-60 min before first meal of the day and Pepcid 20 mg one bedtime until return to office - this is the best way to tell whether stomach acid is contributing to your problem.   Chloretrimeton 4 mg one after supper and one at bedtime  GERD diet  If needed, add the cough medication back x 72 hours    05/31/2013 f/u ov/Wert re:  Chronic cough  Chief Complaint  Patient presents with  . Acute Visit    Presents today with increased SOB with coughing. Onset was 1 week ago. Cough is productive at times.  100% better cough but   Breathing-  never been 100% since dec 2014 (when attempted new /higher level of aerobic activity) Now daily sob with walking across a big parking lot  Cough worse hs Slacked off gerd/h1 mid April and worse since then No better on saba to date rec Pantoprazole (protonix) 40 mg   Take 30-60 min before first meal of the day and Pepcid 20 mg one bedtime x 3 months minimal  Chloretrimeton (chlorpheriamine)  4 mg one after supper and one at bedtime as long as coughing at all Prednisone 10 mg take  4 each am x 2  days,   2 each am x 2 days,  1 each am x 2 days and stop  For cough take robitussin AC up 2 tsp every 4 hours  For breathing > proair up to every 4 hour if needed  GERD diet   06/13/2013 f/u ov/Wert re: cough since Dec 2014, only better on pred, no resp to gerd/h1/saba Chief Complaint  Patient presents with  . Follow-up    Pt states cough and SOB improved on pred, but then returned once she finished med. Her cough is prod with minimal yellow/brown sputum. She has not needed rescue inhaler.    100% resolution from the very first dose of prednisone with no need for saba/ no codeine and maint on acid Rx but downhill since the last dose with all the same symptoms most bothersome at hs cough but not much sob over baseline rec Prednisone 10 mg take  4 each am x 2 days,   2 each am x 2 days,  1 each am x 2 days and stop  qvar 80 Take 2 puffs first thing in am and then another 2 puffs about 12 hours later.  Please see patient coordinator before you leave today  to schedule sinus CT  Please remember to go to the lab   department downstairs for  your tests - we will call you with the results when they are available. For cough > robitussin AC 2 tsp every 4 hours as needed For short of breath > proair 2 every 4 hours as needed  Continue to suppress stomach  acid and nasal drainage as before    07/12/2013 f/u ov/Wert re: cough better to her satisfaction since Nov 2014 / breathing 50% at bet  Chief Complaint  Patient presents with  . Follow-up    Pt states that the cough has pretty much resolved. Her breathing is better, but not back to her normal baseline.  She states that she has not used rescue inhaler.   Doe x across the parking lot, worse in heat  qvar 80 2bid, no need for saba    No obvious day to day or daytime variabilty or assoc  cp or chest tightness, subjective wheeze overt sinus or hb symptoms. No unusual exp hx or h/o childhood pna/ asthma or knowledge of premature birth.  Sleeping ok  without nocturnal  or early am exacerbation  of respiratory  c/o's or need for noct saba. Also denies any obvious fluctuation of symptoms with weather or environmental changes or other aggravating or alleviating factors except as outlined above   Current Medications, Allergies, Complete Past Medical History, Past Surgical History, Family History, and Social History were reviewed in [employer] record.  ROS  The following are not active complaints unless bolded sore throat, dysphagia, dental problems, itching, sneezing,  nasal congestion or excess/ purulent secretions, ear ache,   fever, chills, sweats, unintended wt loss, pleuritic or exertional cp, hemoptysis,  orthopnea pnd or leg swelling, presyncope, palpitations, heartburn, abdominal pain, anorexia, nausea, vomiting, diarrhea  or change in bowel or urinary habits, change in stools or urine, dysuria,hematuria,  rash, arthralgias, visual complaints, headache, numbness weakness or ataxia or problems with walking or coordination,  change in mood/affect or memory.           Objective:   Physical Exam  amb obese wf nad  05/31/2013        295  >  07/12/2013   299 Wt Readings from Last 3 Encounters:  03/16/13 287 lb (130.182 kg)  02/28/13 282 lb (127.914 kg)  02/17/13 285 lb (129.275 kg)     HEENT: nl dentition, turbinates, and orophanx. Nl external ear canals without cough reflex   NECK :  without JVD/Nodes/TM/ nl carotid upstrokes bilaterally   LUNGS: no acc muscle use, clear to A and P bilaterally without cough on insp or exp maneuvers   CV:  RRR  no s3  II/VI sem but no  increase in P2, no edema   ABD:  soft and nontender with nl excursion in the supine position. No bruits or organomegaly, bowel sounds nl  MS:  warm without deformities, calf tenderness, cyanosis or clubbing     cxr 02/10/13 No active cardiopulmonary disease       Assessment & Plan:

## 2013-07-12 NOTE — Patient Instructions (Addendum)
Try qvar 80 Take 2 puffs first thing in am   Weight control is simply a matter of calorie balance which needs to be tilted in your favor by eating less and exercising more.  To get the most out of exercise, you need to be continuously aware that you are short of breath, but never out of breath, for 30 minutes daily. As you improve, it will actually be easier for you to do the same amount of exercise  in  30 minutes so always push to the level where you are short of breath.  If this does not result in gradual weight reduction then I strongly recommend you see a nutritionist with a food diary x 2 weeks so that we can work out a negative calorie balance which is universally effective in steady weight loss programs.  Think of your calorie balance like you do your bank account where in this case you want the balance to go down so you must take in less calories than you burn up.  It's just that simple:  Hard to do, but easy to understand.  Good luck!   Please remember to go to the lab  department downstairs for your tests - we will call you with the results when they are available.  Please schedule a follow up visit in 3 months but call sooner if needed

## 2013-07-14 NOTE — Assessment & Plan Note (Addendum)
-   trial  Of qvar 80 2bid 06/13/2013  - 06/13/2013 allergy profile  Eos 4.9%, IgE 42 Pos Cat dander - 06/16/13 CT Sinus >  No evidence of sinusitis. - 07/12/2013 p extensive coaching HFA effectiveness =    90%  - cough resolved 07/12/13 with no airflow obst on pfts on qvar 80 2bid rec reduce to 2 each am

## 2013-07-14 NOTE — Assessment & Plan Note (Signed)
TSH  07/12/2013 = 1.34   I had an extended discussion with the patient today lasting 15 to 20 minutes of a 25 minute visit on the following issues:   pfts show only low erv c/w obesity effects no airflow obst > cal bal sheet reviewed  Pulmonary f/u 3 m

## 2013-07-14 NOTE — Assessment & Plan Note (Deleted)
TSh  07/12/2013 = 1.34   I had an extended discussion with the patient today lasting 15 to 20 minutes of a 25 minute visit on the following issues:   pfts show only low erv c/w obesity effects no airflow obst > cal bal sheet reviewed  Pulmonary f/u 3 m

## 2013-07-17 LAB — PULMONARY FUNCTION TEST
DL/VA % pred: 110 %
DL/VA: 5.83 ml/min/mmHg/L
DLCO unc % pred: 90 %
DLCO unc: 26.73 ml/min/mmHg
FEF 25-75 Post: 2.32 L/sec
FEF 25-75 Pre: 2.46 L/sec
FEF2575-%CHANGE-POST: -5 %
FEF2575-%PRED-POST: 77 %
FEF2575-%Pred-Pre: 82 %
FEV1-%Change-Post: -1 %
FEV1-%PRED-POST: 75 %
FEV1-%PRED-PRE: 76 %
FEV1-PRE: 2.43 L
FEV1-Post: 2.4 L
FEV1FVC-%CHANGE-POST: 4 %
FEV1FVC-%PRED-PRE: 101 %
FEV6-%Change-Post: -5 %
FEV6-%Pred-Post: 71 %
FEV6-%Pred-Pre: 75 %
FEV6-PRE: 2.97 L
FEV6-Post: 2.82 L
FEV6FVC-%Change-Post: 0 %
FEV6FVC-%Pred-Post: 103 %
FEV6FVC-%Pred-Pre: 102 %
FVC-%Change-Post: -5 %
FVC-%PRED-PRE: 74 %
FVC-%Pred-Post: 70 %
FVC-Post: 2.82 L
FVC-Pre: 3 L
PRE FEV6/FVC RATIO: 99 %
Post FEV1/FVC ratio: 85 %
Post FEV6/FVC ratio: 100 %
Pre FEV1/FVC ratio: 81 %
RV % pred: 102 %
RV: 2.05 L
TLC % pred: 99 %
TLC: 5.6 L

## 2013-08-24 ENCOUNTER — Other Ambulatory Visit: Payer: Self-pay | Admitting: Family Medicine

## 2013-10-14 ENCOUNTER — Other Ambulatory Visit: Payer: Self-pay | Admitting: Internal Medicine

## 2013-10-26 ENCOUNTER — Ambulatory Visit (INDEPENDENT_AMBULATORY_CARE_PROVIDER_SITE_OTHER): Payer: BC Managed Care – PPO | Admitting: Internal Medicine

## 2013-10-26 ENCOUNTER — Other Ambulatory Visit (INDEPENDENT_AMBULATORY_CARE_PROVIDER_SITE_OTHER): Payer: BC Managed Care – PPO

## 2013-10-26 ENCOUNTER — Encounter: Payer: Self-pay | Admitting: Internal Medicine

## 2013-10-26 VITALS — BP 122/84 | HR 75 | Temp 97.5°F | Ht 68.0 in | Wt 304.0 lb

## 2013-10-26 DIAGNOSIS — Z23 Encounter for immunization: Secondary | ICD-10-CM

## 2013-10-26 DIAGNOSIS — R0600 Dyspnea, unspecified: Secondary | ICD-10-CM

## 2013-10-26 DIAGNOSIS — J45991 Cough variant asthma: Secondary | ICD-10-CM

## 2013-10-26 LAB — BRAIN NATRIURETIC PEPTIDE: Pro B Natriuretic peptide (BNP): 45 pg/mL (ref 0.0–100.0)

## 2013-10-26 MED ORDER — CEFUROXIME AXETIL 250 MG PO TABS: 250.0000 mg | ORAL_TABLET | Freq: Two times a day (BID) | ORAL | Status: DC

## 2013-10-26 NOTE — Progress Notes (Signed)
Subjective:    Patient ID: Caroline Ward, female    DOB: May 21, 1960  MRN: [id number]   Brief patient profile:  34 yowf never smoker never allergies since around 2005 pattern of recurrent cough typically  at end Dec / early Jan assoc first with  uri throat/sinus symptoms then cough lasts a few weeks, better p abx but 12/2012 onset same but never better so referred by Dr Etter Sjogren 03/16/2013 for pulmonary eval for ? Asthma.    History of Present Illness  03/16/2013 1st [facility] office visit/ Brandice Busser  Chief Complaint  Patient presents with  . Pulmonary Consult    Referred by Dr. Garnet Koyanagi for asthma  better p flare of cough/ wheeze in Jan 2015 worse after supper and at bedtime and better p prednisone and cough suppression.  No excess or purulent sputum. No better p prednisone, min assoc sob during coughing fits mostly  rec Pantoprazole (protonix) 40 mg   Take 30-60 min before first meal of the day and Pepcid 20 mg one bedtime until return to office - this is the best way to tell whether stomach acid is contributing to your problem.   Chloretrimeton 4 mg one after supper and one at bedtime  GERD diet  If needed, add the cough medication back x 72 hours    05/31/2013 f/u ov/Ireene Ballowe re:  Chronic cough  Chief Complaint  Patient presents with  . Acute Visit    Presents today with increased SOB with coughing. Onset was 1 week ago. Cough is productive at times.  100% better cough but   Breathing-  never been 100% since dec 2014 (when attempted new /higher level of aerobic activity) Now daily sob with walking across a big parking lot  Cough worse hs Slacked off gerd/h1 mid April and worse since then No better on saba to date rec Pantoprazole (protonix) 40 mg   Take 30-60 min before first meal of the day and Pepcid 20 mg one bedtime x 3 months minimal  Chloretrimeton (chlorpheriamine)  4 mg one after supper and one at bedtime as long as coughing at all Prednisone 10 mg take  4 each am x 2  days,   2 each am x 2 days,  1 each am x 2 days and stop  For cough take robitussin AC up 2 tsp every 4 hours  For breathing > proair up to every 4 hour if needed  GERD diet   06/13/2013 f/u ov/Issa Kosmicki re: cough since Dec 2014, only better on pred, no resp to gerd/h1/saba Chief Complaint  Patient presents with  . Follow-up    Pt states cough and SOB improved on pred, but then returned once she finished med. Her cough is prod with minimal yellow/brown sputum. She has not needed rescue inhaler.    100% resolution from the very first dose of prednisone with no need for saba/ no codeine and maint on acid Rx but downhill since the last dose with all the same symptoms most bothersome at hs cough but not much sob over baseline rec Prednisone 10 mg take  4 each am x 2 days,   2 each am x 2 days,  1 each am x 2 days and stop  qvar 80 Take 2 puffs first thing in am and then another 2 puffs about 12 hours later.  Please see patient coordinator before you leave today  to schedule sinus CT  Please remember to go to the lab   department downstairs for  your tests - we will call you with the results when they are available. For cough > robitussin AC 2 tsp every 4 hours as needed For short of breath > proair 2 every 4 hours as needed  Continue to suppress stomach  acid and nasal drainage as before    07/12/2013 f/u ov/Marykatherine Sherwood re: cough better to her satisfaction since Nov 2014 / breathing 50% at best  Chief Complaint  Patient presents with  . Follow-up    Pt states that the cough has pretty much resolved. Her breathing is better, but not back to her normal baseline.  She states that she has not used rescue inhaler.   Doe x across the parking lot, worse in heat  qvar 80 2bid, no need for saba   rec Try qvar 80 Take 2 puffs first thing in am  Weight control is simply a matter of calorie balance   Please remember to go to the lab  department downstairs for your tests    10/26/2013 f/u ov/Bassam Dresch re:  Sob > cough   Chief Complaint  Patient presents with  . Follow-up    Pt c/o increased cough for 3-4 days- non prod. Her breathing is "on and off better"- she is using proair 2 x per wk on average and she feels that this does not help at all.   not clear the proair helping the breathing at all Overall cough much better    No obvious day to day or daytime variabilty or assoc  cp or chest tightness, subjective wheeze overt sinus or hb symptoms. No unusual exp hx or h/o childhood pna/ asthma or knowledge of premature birth.  Sleeping ok without nocturnal  or early am exacerbation  of respiratory  c/o's or need for noct saba. Also denies any obvious fluctuation of symptoms with weather or environmental changes or other aggravating or alleviating factors except as outlined above   Current Medications, Allergies, Complete Past Medical History, Past Surgical History, Family History, and Social History were reviewed in [employer] record.  ROS  The following are not active complaints unless bolded sore throat, dysphagia, dental problems, itching, sneezing,  nasal congestion or excess/ purulent secretions, ear ache,   fever, chills, sweats, unintended wt loss, pleuritic or exertional cp, hemoptysis,  orthopnea pnd or leg swelling, presyncope, palpitations, heartburn, abdominal pain, anorexia, nausea, vomiting, diarrhea  or change in bowel or urinary habits, change in stools or urine, dysuria,hematuria,  rash, arthralgias, visual complaints, headache, numbness weakness or ataxia or problems with walking or coordination,  change in mood/affect or memory.           Objective:   Physical Exam  amb obese wf nad  05/31/2013        295  >  07/12/2013   299 > 10/26/2013 304  Wt Readings from Last 3 Encounters:  03/16/13 287 lb (130.182 kg)  02/28/13 282 lb (127.914 kg)  02/17/13 285 lb (129.275 kg)     HEENT: nl dentition, turbinates, and orophanx. Nl external ear canals without cough  reflex   NECK :  without JVD/Nodes/TM/ nl carotid upstrokes bilaterally   LUNGS: no acc muscle use, clear to A and P bilaterally without cough on insp or exp maneuvers   CV:  RRR  no s3  II/VI sem but no  increase in P2, no edema   ABD:  soft and nontender with nl excursion in the supine position. No bruits or organomegaly, bowel sounds nl  MS:  warm without deformities, calf tenderness, cyanosis or clubbing     cxr 02/10/13 No active cardiopulmonary disease    Lab Results  Component Value Date   TSH 1.34 07/12/2013     Lab Results  Component Value Date   PROBNP 45.0 10/26/2013    Lab Results  Component Value Date   DDIMER 0.54* 10/26/2013         Assessment & Plan:

## 2013-10-26 NOTE — Patient Instructions (Addendum)
Please see patient coordinator before you leave today  to schedule cpst  And we will call you with results   Weight control is simply a matter of calorie balance which needs to be tilted in your favor by eating less and exercising more.  To get the most out of exercise, you need to be continuously aware that you are short of breath, but never out of breath, for 30 minutes daily. As you improve, it will actually be easier for you to do the same amount of exercise  in  30 minutes so always push to the level where you are short of breath.    ceftin 250 twice daily x 5 days   Please remember to go to the lab department downstairs for your tests - we will call you with the results when they are available.

## 2013-10-27 LAB — D-DIMER, QUANTITATIVE: D-Dimer, Quant: 0.54 ug/mL-FEU — ABNORMAL HIGH (ref 0.00–0.48)

## 2013-10-27 NOTE — Assessment & Plan Note (Signed)
-   trial  Of qvar 80 2bid 06/13/2013  - 06/13/2013 allergy profile  Eos 4.9%, IgE 42 Pos Cat dander - 06/16/13 CT Sinus >  No evidence of sinusitis. - 07/12/2013 p extensive coaching HFA effectiveness =    90%  - cough resolved 07/12/13 on qvar 80 2bid rec reduce to 2 each am   Overall adequate control on qvar which she should continue

## 2013-10-27 NOTE — Progress Notes (Signed)
Quick Note:  Spoke with pt and notified of results per Dr. Wert. Pt verbalized understanding and denied any questions.  ______ 

## 2013-10-27 NOTE — Assessment & Plan Note (Addendum)
-   10/26/2013  Walked RA @ very fast pace  x 3 laps @ 185 ft each stopped due to  [employer] [employer] still ok with post ex - spirometry 10/26/13 no obst at all despite active sob   D dimer upper range of nl - while  A nl valute  may miss small peripheral pe, the clot burden with sob is moderately high and the d dimer has a very high neg pred value in this setting   Need to proceed with cpst with spirometry before and after to sort out this complaint but strongly suspect obesity is the main issue and needs to learn better pacing with exertion  See instructions for specific recommendations which were reviewed directly with the patient who was given a copy with highlighter outlining the key components.

## 2013-11-12 ENCOUNTER — Other Ambulatory Visit: Payer: Self-pay | Admitting: Internal Medicine

## 2013-11-16 ENCOUNTER — Ambulatory Visit (INDEPENDENT_AMBULATORY_CARE_PROVIDER_SITE_OTHER): Payer: BC Managed Care – PPO | Admitting: Podiatry

## 2013-11-16 ENCOUNTER — Encounter: Payer: Self-pay | Admitting: Podiatry

## 2013-11-16 ENCOUNTER — Ambulatory Visit (INDEPENDENT_AMBULATORY_CARE_PROVIDER_SITE_OTHER): Payer: BC Managed Care – PPO

## 2013-11-16 VITALS — BP 138/87 | HR 90 | Resp 16 | Ht 68.0 in | Wt 305.0 lb

## 2013-11-16 DIAGNOSIS — M722 Plantar fascial fibromatosis: Secondary | ICD-10-CM

## 2013-11-16 MED ORDER — TRIAMCINOLONE ACETONIDE 10 MG/ML IJ SUSP
10.0000 mg | Freq: Once | INTRAMUSCULAR | Status: AC
  Administered 2013-11-16: 10 mg

## 2013-11-16 NOTE — Patient Instructions (Signed)

## 2013-11-16 NOTE — Progress Notes (Signed)
Subjective:     Patient ID: Caroline Ward, female   DOB: 06/08/1960, 53 y.o.   MRN: [id number]  HPIpatient states I'm having a lot of pain in the bottom of my left heel and this is the third episode of this over the years and my orthotics have worn out. I've tried ice exercises OTC inserts without relief   Review of Systems  All other systems reviewed and are negative.      Objective:   Physical Exam  Constitutional: She is oriented to person, place, and time.  Cardiovascular: Intact distal pulses.   Musculoskeletal: Normal range of motion.  Neurological: She is oriented to person, place, and time.  Skin: Skin is warm.  Nursing note and vitals reviewed. neurovascular status found to be intact with muscle strength adequate and range of motion of the subtalar and midtarsal joint within normal limits. Patient is noted to have exquisite discomfort plantar aspect left heel at the insertion of the tendon into the calcaneus with inflammation and fluid buildup noted patient well oriented 3 with good digital perfusion and mild equinus condition noted     Assessment:     Plantar fasciitis left heel at the insertion of the tendon into the calcaneus    Plan:     H&P and x-rays reviewed and injected the left plantar fascia 3 mg Kenalog 5 mg Xylocaine and applied fascial strapping with instructions on usage area. Continue usage of Naprosyn 500 mg twice a day and reappoint in 1 week

## 2013-11-16 NOTE — Progress Notes (Signed)
   Subjective:    Patient ID: Caroline Ward, female    DOB: Mar 24, 1960, 53 y.o.   MRN: [id number]  HPI Comments: i have left heel pain. Ive had it for several months. Its getting worse. It hurts to walk and stand. Especially after sitting it will hurt and in the mornings it will hurt. i have done exercises and ice and otc insert and that's it.  Foot Pain Associated symptoms include coughing.      Review of Systems  Constitutional: Positive for activity change and unexpected weight change.  Respiratory: Positive for cough, shortness of breath and wheezing.        Difficulty breathing  Musculoskeletal:       Difficulty walking  Neurological: Positive for light-headedness.  All other systems reviewed and are negative.      Objective:   Physical Exam        Assessment & Plan:

## 2013-11-24 ENCOUNTER — Encounter: Payer: Self-pay | Admitting: Podiatry

## 2013-11-24 ENCOUNTER — Ambulatory Visit (INDEPENDENT_AMBULATORY_CARE_PROVIDER_SITE_OTHER): Payer: BC Managed Care – PPO | Admitting: Podiatry

## 2013-11-24 VITALS — BP 152/86 | HR 80 | Resp 16

## 2013-11-24 DIAGNOSIS — M722 Plantar fascial fibromatosis: Secondary | ICD-10-CM

## 2013-11-24 MED ORDER — TRIAMCINOLONE ACETONIDE 10 MG/ML IJ SUSP
10.0000 mg | Freq: Once | INTRAMUSCULAR | Status: AC
  Administered 2013-11-24: 10 mg

## 2013-11-24 NOTE — Progress Notes (Signed)
Subjective:     Patient ID: Caroline Ward, female   DOB: Jun 02, 1960, 53 y.o.   MRN: [id number]  HPI patient states she's doing better with the brace but is still having a lot of pain in the left heel if she stands and she is just getting ready to start her busy season at work   Review of Systems     Objective:   Physical Exam Neurovascular status intact with continued discomfort left plantar fascia at the insertion of the tendon into the calcaneus with inflammation and fluid buildup noted    Assessment:     Plantar fasciitis left at the insertion of the tendon into the calcaneus    Plan:     Reviewed condition and injected the plantar fascia 3 mg Kenalog 5 mg Xylocaine and scanned for custom orthotics due to long-standing history of plantar fasciitis and foot structural

## 2013-11-26 ENCOUNTER — Other Ambulatory Visit: Payer: Self-pay | Admitting: Family Medicine

## 2013-12-05 ENCOUNTER — Ambulatory Visit (INDEPENDENT_AMBULATORY_CARE_PROVIDER_SITE_OTHER): Payer: BC Managed Care – PPO

## 2013-12-05 DIAGNOSIS — M722 Plantar fascial fibromatosis: Secondary | ICD-10-CM

## 2013-12-05 NOTE — Patient Instructions (Signed)

## 2013-12-16 ENCOUNTER — Other Ambulatory Visit: Payer: Self-pay | Admitting: Family Medicine

## 2013-12-22 ENCOUNTER — Telehealth (HOSPITAL_COMMUNITY): Payer: Self-pay | Admitting: Unknown Physician Specialty

## 2014-01-19 ENCOUNTER — Encounter: Payer: Self-pay | Admitting: Family Medicine

## 2014-01-19 ENCOUNTER — Telehealth (HOSPITAL_COMMUNITY): Payer: Self-pay | Admitting: Unknown Physician Specialty

## 2014-01-19 ENCOUNTER — Ambulatory Visit (INDEPENDENT_AMBULATORY_CARE_PROVIDER_SITE_OTHER): Payer: BLUE CROSS/BLUE SHIELD | Admitting: Family Medicine

## 2014-01-19 VITALS — BP 155/88 | HR 87 | Temp 97.8°F | Wt 302.8 lb

## 2014-01-19 DIAGNOSIS — L600 Ingrowing nail: Secondary | ICD-10-CM

## 2014-01-19 MED ORDER — DOXYCYCLINE HYCLATE 100 MG PO TABS: 100.0000 mg | ORAL_TABLET | Freq: Two times a day (BID) | ORAL | Status: DC

## 2014-01-19 NOTE — Progress Notes (Signed)
   Subjective:    Patient ID: Caroline Ward, female    DOB: 1960-08-07, 54 y.o.   MRN: [id number]  HPI Pt here c/o ingrown infected Left big toenail.  Pt had been put on abx earlier in dec and it resolved but came back 2 weeks later.     Review of Systems  Constitutional: Negative for fever, chills, diaphoresis, activity change, appetite change, fatigue and unexpected weight change.  Skin: Positive for wound. Negative for color change, pallor and rash.       L eft big toe red and tender, ingrown toenail       Objective:   Physical Exam  BP 155/88 mmHg  Pulse 87  Temp(Src) 97.8 F (36.6 C) (Oral)  Wt 302 lb 12.8 oz (137.349 kg)  SpO2 97% General appearance: alert, cooperative, appears stated age and no distress Extremities: L big toenail-- ingrown and ingected, tender to touch      Assessment & Plan:  1. Ingrown left big toenail  - doxycycline (VIBRA-TABS) 100 MG tablet; Take 1 tablet (100 mg total) by mouth 2 (two) times daily.  Dispense: 20 tablet; Refill: 0 - Ambulatory referral to Podiatry

## 2014-01-19 NOTE — Patient Instructions (Signed)
Infected Ingrown Toenail °An infected ingrown toenail occurs when the nail edge grows into the skin and bacteria invade the area. Symptoms include pain, tenderness, swelling, and pus drainage from the edge of the nail. Poorly fitting shoes, minor injuries, and improper cutting of the toenail may also contribute to the problem. You should cut your toenails squarely instead of rounding the edges. Do not cut them too short. Avoid tight or pointed toe shoes. Sometimes the ingrown portion of the nail must be removed. If your toenail is removed, it can take 3-4 months for it to re-grow. °HOME CARE INSTRUCTIONS  °· Soak your infected toe in warm water for 20-30 minutes, 2 to 3 times a day. °· Packing or dressings applied to the area should be changed daily. °· Take medicine as directed and finish them. °· Reduce activities and keep your foot elevated when able to reduce swelling and discomfort. Do this until the infection gets better. °· Wear sandals or go barefoot as much as possible while the infected area is sensitive. °· See your caregiver for follow-up care in 2-3 days if the infection is not better. °SEEK MEDICAL CARE IF:  °Your toe is becoming more red, swollen or painful. °MAKE SURE YOU:  °· Understand these instructions. °· Will watch your condition. °· Will get help right away if you are not doing well or get worse. °Document Released: 02/07/2004 Document Revised: 03/24/2011 Document Reviewed: 12/27/2007 °ExitCare® Patient Information ©2015 [employer]. This information is not intended to replace advice given to you by your health care provider. Make sure you discuss any questions you have with your health care provider. ° °

## 2014-01-19 NOTE — Progress Notes (Signed)
Pre visit review using our clinic review tool, if applicable. No additional management support is needed unless otherwise documented below in the visit note. 

## 2014-01-20 ENCOUNTER — Encounter (HOSPITAL_COMMUNITY): Payer: BC Managed Care – PPO

## 2014-01-20 ENCOUNTER — Telehealth (HOSPITAL_COMMUNITY): Payer: Self-pay | Admitting: Internal Medicine

## 2014-01-23 ENCOUNTER — Ambulatory Visit (INDEPENDENT_AMBULATORY_CARE_PROVIDER_SITE_OTHER): Payer: BLUE CROSS/BLUE SHIELD | Admitting: Podiatry

## 2014-01-23 ENCOUNTER — Encounter: Payer: Self-pay | Admitting: Podiatry

## 2014-01-23 VITALS — BP 145/85 | HR 87 | Resp 16

## 2014-01-23 DIAGNOSIS — L600 Ingrowing nail: Secondary | ICD-10-CM

## 2014-01-23 NOTE — Patient Instructions (Signed)

## 2014-01-25 NOTE — Progress Notes (Signed)
Subjective:     Patient ID: Caroline Ward, female   DOB: May 01, 1960, 54 y.o.   MRN: [id number]  HPI patient presents with ingrown toenail of the left big toe lateral border that has been sore and she has tried to trim and soak without relief   Review of Systems     Objective:   Physical Exam Neurovascular status intact muscle strength adequate with incurvated lateral border left hallux that's very painful when pressed    Assessment:     Ingrown deformity left hallux very painful with improved plantar fasciitis    Plan:     Reviewed condition and recommended removal of the corner and explained procedure and risk. Patient wants procedure and today I infiltrated 60 mg Xylocaine Marcaine mixture remove the lateral border exposed matrix and applied phenol 3 applications 30 seconds followed by alcohol lavaged and sterile dressing. Gave instructions on soaks and reappoint

## 2014-01-26 ENCOUNTER — Ambulatory Visit: Payer: Self-pay | Admitting: Podiatry

## 2014-02-08 ENCOUNTER — Other Ambulatory Visit: Payer: Self-pay | Admitting: Family Medicine

## 2014-02-10 ENCOUNTER — Other Ambulatory Visit: Payer: Self-pay

## 2014-02-10 DIAGNOSIS — Z1231 Encounter for screening mammogram for malignant neoplasm of breast: Secondary | ICD-10-CM

## 2014-02-20 ENCOUNTER — Other Ambulatory Visit: Payer: Self-pay | Admitting: Family Medicine

## 2014-02-20 NOTE — Telephone Encounter (Signed)
Last filled:  12/16/13 Amt: 30, 0 Last OV:  01/19/14 for ingrown toenail Need a physical.

## 2014-02-21 ENCOUNTER — Ambulatory Visit
Admission: RE | Admit: 2014-02-21 | Discharge: 2014-02-21 | Disposition: A | Discharge status: Home / Self Care | Payer: BLUE CROSS/BLUE SHIELD | Source: Ambulatory Visit

## 2014-02-21 DIAGNOSIS — Z1231 Encounter for screening mammogram for malignant neoplasm of breast: Secondary | ICD-10-CM

## 2014-03-07 ENCOUNTER — Encounter: Payer: Self-pay | Admitting: Family Medicine

## 2014-03-07 ENCOUNTER — Ambulatory Visit (INDEPENDENT_AMBULATORY_CARE_PROVIDER_SITE_OTHER): Payer: BLUE CROSS/BLUE SHIELD | Admitting: Family Medicine

## 2014-03-07 VITALS — BP 155/91 | HR 87 | Temp 97.8°F | Wt 320.0 lb

## 2014-03-07 DIAGNOSIS — I10 Essential (primary) hypertension: Secondary | ICD-10-CM

## 2014-03-07 DIAGNOSIS — R42 Dizziness and giddiness: Secondary | ICD-10-CM

## 2014-03-07 LAB — HEPATIC FUNCTION PANEL
ALK PHOS: 91 U/L (ref 39–117)
ALT: 27 U/L (ref 0–35)
AST: 21 U/L (ref 0–37)
Albumin: 4 g/dL (ref 3.5–5.2)
BILIRUBIN DIRECT: 0 mg/dL (ref 0.0–0.3)
TOTAL PROTEIN: 7.2 g/dL (ref 6.0–8.3)
Total Bilirubin: 0.3 mg/dL (ref 0.2–1.2)

## 2014-03-07 LAB — CBC WITH DIFFERENTIAL/PLATELET
Basophils Absolute: 0 10*3/uL (ref 0.0–0.1)
Basophils Relative: 0.4 % (ref 0.0–3.0)
EOS PCT: 2.4 % (ref 0.0–5.0)
Eosinophils Absolute: 0.3 10*3/uL (ref 0.0–0.7)
HCT: 41.4 % (ref 36.0–46.0)
Hemoglobin: 13.8 g/dL (ref 12.0–15.0)
LYMPHS PCT: 18.1 % (ref 12.0–46.0)
Lymphs Abs: 2.2 10*3/uL (ref 0.7–4.0)
MCHC: 33.3 g/dL (ref 30.0–36.0)
MCV: 81 fl (ref 78.0–100.0)
MONO ABS: 0.8 10*3/uL (ref 0.1–1.0)
Monocytes Relative: 6.4 % (ref 3.0–12.0)
NEUTROS PCT: 72.7 % (ref 43.0–77.0)
Neutro Abs: 8.8 10*3/uL — ABNORMAL HIGH (ref 1.4–7.7)
PLATELETS: 337 10*3/uL (ref 150.0–400.0)
RBC: 5.11 Mil/uL (ref 3.87–5.11)
RDW: 13.8 % (ref 11.5–15.5)
WBC: 12.2 10*3/uL — AB (ref 4.0–10.5)

## 2014-03-07 LAB — BASIC METABOLIC PANEL
BUN: 12 mg/dL (ref 6–23)
CHLORIDE: 102 meq/L (ref 96–112)
CO2: 28 mEq/L (ref 19–32)
CREATININE: 0.69 mg/dL (ref 0.40–1.20)
Calcium: 9.5 mg/dL (ref 8.4–10.5)
GFR: 95.27 mL/min (ref >60.00)
Glucose, Bld: 90 mg/dL (ref 70–99)
Potassium: 4.1 mEq/L (ref 3.5–5.1)
SODIUM: 136 meq/L (ref 135–145)

## 2014-03-07 LAB — TSH: TSH: 1.35 u[IU]/mL (ref 0.35–4.50)

## 2014-03-07 LAB — EKG 12-LEAD

## 2014-03-07 LAB — VITAMIN B12: Vitamin B-12: 432 pg/mL (ref 211–911)

## 2014-03-07 MED ORDER — LISINOPRIL-HYDROCHLOROTHIAZIDE 10-12.5 MG PO TABS: 1.0000 | ORAL_TABLET | Freq: Every day | ORAL | Status: DC

## 2014-03-07 NOTE — Assessment & Plan Note (Signed)
Probably from bp Check labs rto 2-3 weeks

## 2014-03-07 NOTE — Progress Notes (Signed)
Subjective:    Patient ID: Caroline Ward, female    DOB: 1960/05/12, 54 y.o.   MRN: [id number]  HPI  Patient here for c/o dizziness and high bp--last few days.    Past Medical History  Diagnosis Date  . Depression   . Anxiety   . Migraine   . Heart murmur     Review of Systems  Constitutional: Negative for activity change, appetite change and unexpected weight change.  Respiratory: Positive for chest tightness. Negative for cough, shortness of breath and wheezing.   Cardiovascular: Negative for chest pain, palpitations and leg swelling.  Musculoskeletal: Negative for myalgias, joint swelling and arthralgias.  Skin: Negative for color change and rash.  Neurological: Positive for dizziness and headaches. Negative for tremors, seizures, syncope, speech difficulty, weakness, light-headedness and numbness.  Psychiatric/Behavioral: Negative for behavioral problems and dysphoric mood. The patient is not nervous/anxious.        Objective:    Physical Exam  Constitutional: She is oriented to person, place, and time. Vital signs are normal. She appears well-developed. No distress.  HENT:  Right Ear: External ear normal.  Left Ear: External ear normal.  Nose: Nose normal.  Mouth/Throat: Oropharynx is clear and moist.  Eyes: EOM are normal. Pupils are equal, round, and reactive to light.  Neck: Normal range of motion. Neck supple.  Cardiovascular: Normal rate, regular rhythm and normal heart sounds.   No murmur heard. Pulmonary/Chest: Effort normal and breath sounds normal. No respiratory distress. She has no wheezes. She has no rales. She exhibits no tenderness.  Neurological: She is alert and oriented to person, place, and time.  Psychiatric: She has a normal mood and affect. Her behavior is normal. Judgment and thought content normal.    BP 155/91 mmHg  Pulse 87  Temp(Src) 97.8 F (36.6 C) (Oral)  Wt 320 lb (145.151 kg)  SpO2 97% Wt Readings from Last 3 Encounters:    03/07/14 320 lb (145.151 kg)  01/19/14 302 lb 12.8 oz (137.349 kg)  11/16/13 305 lb (138.347 kg)     Lab Results  Component Value Date   WBC 12.5* 06/13/2013   HGB 13.3 06/13/2013   HCT 39.8 06/13/2013   PLT 304.0 06/13/2013   GLUCOSE 128* 07/12/2013   CHOL 192 06/10/2012   TRIG 110.0 06/10/2012   HDL 47.20 06/10/2012   LDLDIRECT 144.8 05/19/2011   LDLCALC 123* 06/10/2012   ALT 8 06/10/2012   AST 11 06/10/2012   NA 137 07/12/2013   K 3.8 07/12/2013   CL 101 07/12/2013   CREATININE 0.9 07/12/2013   BUN 14 07/12/2013   CO2 29 07/12/2013   TSH 1.34 07/12/2013    Mm Digital Screening Bilateral  02/21/2014   CLINICAL DATA:  Screening.  EXAM: DIGITAL SCREENING BILATERAL MAMMOGRAM WITH CAD  COMPARISON:  Previous exam(s).  ACR Breast Density Category b: There are scattered areas of fibroglandular density.  FINDINGS: There are no findings suspicious for malignancy. Images were processed with CAD.  IMPRESSION: No mammographic evidence of malignancy. A result letter of this screening mammogram will be mailed directly to the patient.  RECOMMENDATION: Screening mammogram in one year. (Code:SM-B-01Y)  BI-RADS CATEGORY  1: Negative.   Electronically Signed   By: Claudie Revering M.D.   On: 02/21/2014 15:29       Assessment & Plan:   Problem List Items Addressed This Visit    HTN (hypertension) - Primary    Lisinopril hct Pt instructed to eat / drink high potassium foods  Recheck 2 weeks D/w pt her weight gain and importance of diet and exercise      Relevant Medications   LISINOPRIL-HCTZ 10-12.5 MG PO TABS   Other Relevant Orders   EKG 12-Lead (Completed)   Basic metabolic panel   CBC with Differential/Platelet   Hepatic function panel   Vitamin B12   TSH   POCT urinalysis dipstick   Dizzy    Probably from bp Check labs rto 2-3 weeks      Relevant Orders   Basic metabolic panel   CBC with Differential/Platelet   Hepatic function panel   Vitamin B12   TSH   POCT  urinalysis dipstick       Garnet Koyanagi, DO

## 2014-03-07 NOTE — Assessment & Plan Note (Signed)
Lisinopril hct Pt instructed to eat / drink high potassium foods Recheck 2 weeks D/w pt her weight gain and importance of diet and exercise

## 2014-03-07 NOTE — Patient Instructions (Signed)

## 2014-03-07 NOTE — Progress Notes (Signed)
Pre visit review using our clinic review tool, if applicable. No additional management support is needed unless otherwise documented below in the visit note. 

## 2014-03-11 ENCOUNTER — Other Ambulatory Visit: Payer: Self-pay | Admitting: Family Medicine

## 2014-03-19 ENCOUNTER — Ambulatory Visit (INDEPENDENT_AMBULATORY_CARE_PROVIDER_SITE_OTHER): Payer: BLUE CROSS/BLUE SHIELD | Admitting: Emergency Medicine

## 2014-03-19 VITALS — BP 140/90 | HR 80 | Temp 98.2°F | Resp 20 | Ht 67.5 in | Wt 295.0 lb

## 2014-03-19 DIAGNOSIS — J0140 Acute pansinusitis, unspecified: Secondary | ICD-10-CM | POA: Diagnosis not present

## 2014-03-19 DIAGNOSIS — J209 Acute bronchitis, unspecified: Secondary | ICD-10-CM | POA: Diagnosis not present

## 2014-03-19 DIAGNOSIS — J042 Acute laryngotracheitis: Secondary | ICD-10-CM | POA: Diagnosis not present

## 2014-03-19 MED ORDER — AMOXICILLIN-POT CLAVULANATE 875-125 MG PO TABS: 1.0000 | ORAL_TABLET | Freq: Two times a day (BID) | ORAL | Status: DC

## 2014-03-19 MED ORDER — PSEUDOEPHEDRINE-GUAIFENESIN ER 60-600 MG PO TB12: 1.0000 | ORAL_TABLET | Freq: Two times a day (BID) | ORAL | Status: DC

## 2014-03-19 MED ORDER — HYDROCOD POLST-CHLORPHEN POLST 10-8 MG/5ML PO LQCR: 5.0000 mL | Freq: Two times a day (BID) | ORAL | Status: DC | PRN

## 2014-03-19 NOTE — Patient Instructions (Signed)

## 2014-03-19 NOTE — Progress Notes (Signed)
[facility] [address] [place] [address] [phone]  Date:  03/19/2014   Name:  Caroline Ward   DOB:  04-17-1960   MRN:  [id number]  PCP:  Garnet Koyanagi, DO    Chief Complaint: URI   History of Present Illness:  Caroline Ward is a 54 y.o. very pleasant female patient who presents with the following:  Ill for a week with nasal congestion and post nasal drainage.. Purulent in nature Pressure in cheeks Sore throat and hoarseness. Intermittent fever. Cough with some wheezing and exertional shortness of breath Not productive No nausea or vomiting. No improvement with over the counter medications or other home remedies. Denies other complaint or health concern today.    Patient Active Problem List   Diagnosis Date Noted  . HTN (hypertension) 03/07/2014  . Dizzy 03/07/2014  . Dyspnea 10/26/2013  . Weight gain 07/12/2013  . Cough variant asthma 06/13/2013  . Upper airway cough syndrome 03/18/2013  . Obesity (BMI 30-39.9) 02/17/2013  . NEVI, MULTIPLE 08/30/2008  . Morbid obesity 08/15/2008  . COMMON MIGRAINE 08/15/2008  . PLANTAR FASCIITIS, RIGHT 08/15/2008  . GUAIAC POSITIVE STOOL 09/21/2007  . SEBACEOUS CYST 03/03/2007  . ANXIETY 06/10/2006  . DEPRESSION 06/10/2006  . DYSMENORRHEA 06/10/2006    Past Medical History  Diagnosis Date  . Depression   . Anxiety   . Migraine   . Heart murmur   . Asthma     History reviewed. No pertinent past surgical history.  History  Substance Use Topics  . Smoking status: Never Smoker   . Smokeless tobacco: Never Used  . Alcohol Use: 0.0 oz/week    0 Standard drinks or equivalent per week     Comment: Occ    Family History  Problem Relation Age of Onset  . Lung cancer Father   . Liver disease Maternal Aunt   . Breast cancer Maternal Aunt   . Dementia Maternal Aunt   . Hyperlipidemia Mother   . COPD Mother   . Hypertension Mother   . Alcohol abuse Father   . Throat cancer Father   .  Pancreatic cancer Brother     pancreas  . Cancer Paternal Uncle     type unbknown  . Other Brother     vein issues    No Known Allergies  Medication list has been reviewed and updated.  Current Outpatient Prescriptions on File Prior to Visit  Medication Sig Dispense Refill  . ALPRAZolam (XANAX) 0.25 MG tablet TAKE 1 TABLET BY MOUTH ONCE DAILY AS NEEDED FOR SLEEP 30 tablet 0  . beclomethasone (QVAR) 80 MCG/ACT inhaler Inhale 1 puff into the lungs daily.    . Calcium-Magnesium-Vitamin D 151-76-160 MG-MG-UNIT CHEW Chew 1 capsule by mouth daily.    . citalopram (CELEXA) 20 MG tablet TAKE 1 TABLET BY MOUTH EVERY DAY 30 tablet 0  . famotidine (PEPCID) 20 MG tablet One at bedtime 30 tablet 11  . Multiple Vitamin (MULTIVITAMIN) capsule Take 1 capsule by mouth daily.    . naproxen (NAPROSYN) 500 MG tablet TAKE 1 TABLET BY MOUTH TWICE DAILY WITH MEALS 180 tablet 0  . pantoprazole (PROTONIX) 40 MG tablet TAKE 1 TABLET BY MOUTH EVERY DAY 30 TO 60 MINUTES BEFORE FIRST MEAL OF DAY 30 tablet 5   No current facility-administered medications on file prior to visit.    Review of Systems:  As per HPI, otherwise negative.    Physical Examination: Filed Vitals:   03/19/14 7371  BP: 140/90  Pulse: 80  Temp: 98.2 F (36.8 C)  Resp: 20   Filed Vitals:   03/19/14 1243  Height: 5' 7.5" (1.715 m)  Weight: 295 lb (133.811 kg)   Body mass index is 45.49 kg/(m^2). Ideal Body Weight: Weight in (lb) to have BMI = 25: 161.7  GEN: WDWN, NAD, Non-toxic, A & O x 3 HEENT: Atraumatic, Normocephalic. Neck supple. No masses, No LAD. Ears and Nose: No external deformity.  Purulent nasal draiange CV: RRR, No M/G/R. No JVD. No thrill. No extra heart sounds. PULM: CTA B, no wheezes, crackles, rhonchi. No retractions. No resp. distress. No accessory muscle use. ABD: S, NT, ND, +BS. No rebound. No HSM. EXTR: No c/c/e NEURO Normal gait.  PSYCH: Normally interactive. Conversant. Not depressed or anxious  appearing.  Calm demeanor.    Assessment and Plan: Sinusitis Bronchitis augmentin tussionex mucinex d  Signed,  Ellison Carwin, MD

## 2014-03-23 ENCOUNTER — Ambulatory Visit: Payer: BLUE CROSS/BLUE SHIELD | Admitting: Family Medicine

## 2014-03-27 ENCOUNTER — Ambulatory Visit: Payer: BLUE CROSS/BLUE SHIELD | Admitting: Family Medicine

## 2014-03-30 ENCOUNTER — Ambulatory Visit: Payer: BLUE CROSS/BLUE SHIELD | Admitting: Family Medicine

## 2014-03-31 ENCOUNTER — Ambulatory Visit (HOSPITAL_BASED_OUTPATIENT_CLINIC_OR_DEPARTMENT_OTHER)
Admission: RE | Admit: 2014-03-31 | Discharge: 2014-03-31 | Disposition: A | Discharge status: Home / Self Care | Payer: BLUE CROSS/BLUE SHIELD | Source: Ambulatory Visit | Attending: Family Medicine | Admitting: Family Medicine

## 2014-03-31 ENCOUNTER — Encounter: Payer: Self-pay | Admitting: Family Medicine

## 2014-03-31 ENCOUNTER — Ambulatory Visit (INDEPENDENT_AMBULATORY_CARE_PROVIDER_SITE_OTHER): Payer: BLUE CROSS/BLUE SHIELD | Admitting: Family Medicine

## 2014-03-31 VITALS — BP 130/82 | HR 74 | Temp 98.1°F | Wt 293.2 lb

## 2014-03-31 DIAGNOSIS — R05 Cough: Secondary | ICD-10-CM | POA: Diagnosis not present

## 2014-03-31 DIAGNOSIS — J0110 Acute frontal sinusitis, unspecified: Secondary | ICD-10-CM

## 2014-03-31 DIAGNOSIS — R059 Cough, unspecified: Secondary | ICD-10-CM

## 2014-03-31 DIAGNOSIS — D72829 Elevated white blood cell count, unspecified: Secondary | ICD-10-CM

## 2014-03-31 DIAGNOSIS — J069 Acute upper respiratory infection, unspecified: Secondary | ICD-10-CM

## 2014-03-31 LAB — CBC WITH DIFFERENTIAL/PLATELET
BASOS ABS: 0.1 10*3/uL (ref 0.0–0.1)
Basophils Relative: 0.6 % (ref 0.0–3.0)
EOS ABS: 0.4 10*3/uL (ref 0.0–0.7)
Eosinophils Relative: 5.1 % — ABNORMAL HIGH (ref 0.0–5.0)
HCT: 40.9 % (ref 36.0–46.0)
Hemoglobin: 13.7 g/dL (ref 12.0–15.0)
Lymphocytes Relative: 26 % (ref 12.0–46.0)
Lymphs Abs: 2.1 10*3/uL (ref 0.7–4.0)
MCHC: 33.4 g/dL (ref 30.0–36.0)
MCV: 81.3 fl (ref 78.0–100.0)
Monocytes Absolute: 0.5 10*3/uL (ref 0.1–1.0)
Monocytes Relative: 6.2 % (ref 3.0–12.0)
NEUTROS ABS: 5 10*3/uL (ref 1.4–7.7)
NEUTROS PCT: 62.1 % (ref 43.0–77.0)
Platelets: 313 10*3/uL (ref 150.0–400.0)
RBC: 5.03 Mil/uL (ref 3.87–5.11)
RDW: 13.9 % (ref 11.5–15.5)
WBC: 8 10*3/uL (ref 4.0–10.5)

## 2014-03-31 MED ORDER — PROMETHAZINE-DM 6.25-15 MG/5ML PO SYRP: 5.0000 mL | ORAL_SOLUTION | Freq: Four times a day (QID) | ORAL | Status: DC | PRN

## 2014-03-31 MED ORDER — FLUTICASONE PROPIONATE 50 MCG/ACT NA SUSP: 2.0000 | Freq: Every day | NASAL | Status: DC

## 2014-03-31 NOTE — Progress Notes (Signed)
Pre visit review using our clinic review tool, if applicable. No additional management support is needed unless otherwise documented below in the visit note. 

## 2014-03-31 NOTE — Progress Notes (Signed)
Subjective:    Patient ID: Caroline Ward, female    DOB: [birth year], 54 y.o.   MRN: [id number]  HPI  Patient here for f/u sinusitis.  She is still coughing and it is productive.  No fever.  Sinus symptoms have resolved---she only has cough and sore throat.     Past Medical History  Diagnosis Date  . Depression   . Anxiety   . Migraine   . Heart murmur   . Asthma     Review of Systems  Constitutional: Negative for fever and chills.  HENT: Positive for congestion, postnasal drip and sore throat. Negative for rhinorrhea and sinus pressure.   Respiratory: Positive for cough. Negative for chest tightness, shortness of breath and wheezing.   Cardiovascular: Negative for chest pain, palpitations and leg swelling.  Allergic/Immunologic: Negative for environmental allergies.  Psychiatric/Behavioral: Negative for dysphoric mood and decreased concentration. The patient is not nervous/anxious.     Current Outpatient Prescriptions on File Prior to Visit  Medication Sig Dispense Refill  . ALPRAZolam (XANAX) 0.25 MG tablet TAKE 1 TABLET BY MOUTH ONCE DAILY AS NEEDED FOR SLEEP 30 tablet 0  . beclomethasone (QVAR) 80 MCG/ACT inhaler Inhale 1 puff into the lungs daily.    . Calcium-Magnesium-Vitamin D [id number] MG-MG-UNIT CHEW Chew 1 capsule by mouth daily.    . citalopram (CELEXA) 20 MG tablet TAKE 1 TABLET BY MOUTH EVERY DAY 30 tablet 0  . famotidine (PEPCID) 20 MG tablet One at bedtime 30 tablet 11  . Multiple Vitamin (MULTIVITAMIN) capsule Take 1 capsule by mouth daily.    . naproxen (NAPROSYN) 500 MG tablet TAKE 1 TABLET BY MOUTH TWICE DAILY WITH MEALS 180 tablet 0  . pantoprazole (PROTONIX) 40 MG tablet TAKE 1 TABLET BY MOUTH EVERY DAY 30 TO 60 MINUTES BEFORE FIRST MEAL OF DAY 30 tablet 5   No current facility-administered medications on file prior to visit.       Objective:    Physical Exam  Constitutional: She is oriented to person, place, and time. She appears well-developed  and well-nourished.  HENT:  Right Ear: External ear normal.  Left Ear: External ear normal.  + PND + errythema  Eyes: Conjunctivae are normal. Right eye exhibits no discharge. Left eye exhibits no discharge.  Cardiovascular: Normal rate, regular rhythm and normal heart sounds.   No murmur heard. Pulmonary/Chest: Effort normal and breath sounds normal. No respiratory distress. She has no wheezes. She has no rales. She exhibits no tenderness.  Musculoskeletal: She exhibits no edema.  Lymphadenopathy:    She has cervical adenopathy.  Neurological: She is alert and oriented to person, place, and time.    BP 130/82 mmHg  Pulse 74  Temp(Src) 98.1 F (36.7 C) (Oral)  Wt 293 lb 3.2 oz (132.995 kg)  SpO2 94% Wt Readings from Last 3 Encounters:  03/31/14 293 lb 3.2 oz (132.995 kg)  03/19/14 295 lb (133.811 kg)  03/07/14 320 lb (145.151 kg)     Lab Results  Component Value Date   WBC 12.2* 03/07/2014   HGB 13.8 03/07/2014   HCT 41.4 03/07/2014   PLT 337.0 03/07/2014   GLUCOSE 90 03/07/2014   CHOL 192 06/10/2012   TRIG 110.0 06/10/2012   HDL 47.20 06/10/2012   LDLDIRECT 144.8 05/19/2011   LDLCALC 123* 06/10/2012   ALT 27 03/07/2014   AST 21 03/07/2014   NA 136 03/07/2014   K 4.1 03/07/2014   CL 102 03/07/2014   CREATININE 0.69 03/07/2014   BUN  12 03/07/2014   CO2 28 03/07/2014   TSH 1.35 03/07/2014       Assessment & Plan:   Problem List Items Addressed This Visit    None    Visit Diagnoses    Cough    -  Primary    Relevant Medications    promethazine-dextromethorphan (PROMETHAZINE-DM) syrup 6.25-15 mg/32mL    Other Relevant Orders    DG Chest 2 View    Acute frontal sinusitis, recurrence not specified        Relevant Medications    promethazine-dextromethorphan (PROMETHAZINE-DM) syrup 6.25-15 mg/7mL    Other Relevant Orders    CBC with Differential/Platelet       I have discontinued Ms. Mccarney's amoxicillin-clavulanate, pseudoephedrine-guaifenesin, and  chlorpheniramine-HYDROcodone. I am also having her start on promethazine-dextromethorphan. Additionally, I am having her maintain her ALPRAZolam, Calcium-Magnesium-Vitamin D, famotidine, multivitamin, beclomethasone, pantoprazole, naproxen, and citalopram.  Meds ordered this encounter  Medications  . promethazine-dextromethorphan (PROMETHAZINE-DM) 6.25-15 MG/5ML syrup    Sig: Take 5 mLs by mouth 4 (four) times daily as needed for cough.    Dispense:  118 mL    Refill:  0     Garnet Koyanagi, DO

## 2014-03-31 NOTE — Patient Instructions (Signed)
Get xray downstairs--- we will call you with the results Use cough med flonase and antihistamine ie. Zyrtec, claritin, allergra or chlortrimeton Call if symptoms do not resolve

## 2014-04-11 ENCOUNTER — Other Ambulatory Visit: Payer: Self-pay | Admitting: Family Medicine

## 2014-04-24 ENCOUNTER — Encounter: Payer: Self-pay | Admitting: Podiatry

## 2014-04-24 ENCOUNTER — Ambulatory Visit (INDEPENDENT_AMBULATORY_CARE_PROVIDER_SITE_OTHER): Payer: BLUE CROSS/BLUE SHIELD | Admitting: Podiatry

## 2014-04-24 VITALS — BP 147/85 | HR 91 | Resp 18

## 2014-04-24 DIAGNOSIS — M722 Plantar fascial fibromatosis: Secondary | ICD-10-CM

## 2014-04-25 NOTE — Progress Notes (Signed)
Subjective:     Patient ID: Caroline Ward, female   DOB: 09/09/1960, 54 y.o.   MRN: [id number]  HPI patient states that my left foot is doing well but I feel like the orthotic may be too high   Review of Systems     Objective:   Physical Exam Change in health history with orthotics that are contoured well but may be giving too much support left    Assessment:     Plantar fasciitis improved    Plan:     Went ahead today and I lowered the left orthotics and we will see how this does for patient

## 2014-05-11 ENCOUNTER — Other Ambulatory Visit: Payer: Self-pay | Admitting: Internal Medicine

## 2014-06-05 ENCOUNTER — Ambulatory Visit (INDEPENDENT_AMBULATORY_CARE_PROVIDER_SITE_OTHER): Payer: BLUE CROSS/BLUE SHIELD | Admitting: Podiatry

## 2014-06-05 ENCOUNTER — Encounter: Payer: Self-pay | Admitting: Podiatry

## 2014-06-05 VITALS — BP 156/92 | HR 100 | Resp 13

## 2014-06-05 DIAGNOSIS — M722 Plantar fascial fibromatosis: Secondary | ICD-10-CM

## 2014-06-06 NOTE — Progress Notes (Signed)
Subjective:     Patient ID: Caroline Ward, female   DOB: 01-11-1961, 54 y.o.   MRN: [id number]  HPI patient states I'm having a lot of pain in my left heel continues to bother me and I know I had shockwave on the right and that got it better and I think I needed on the left   Review of Systems     Objective:   Physical Exam Neurovascular status intact muscle strength adequate with patient having pain in the left plantar heel despite conservative care    Assessment:     Plantar fasciitis left of a significant nature with pain    Plan:     Reviewed condition and at this time I recommended shockwave therapy and reviewed shockwave therapy for condition. Scheduled for shockwave surgery

## 2014-06-13 ENCOUNTER — Ambulatory Visit (INDEPENDENT_AMBULATORY_CARE_PROVIDER_SITE_OTHER): Payer: BLUE CROSS/BLUE SHIELD | Admitting: Podiatry

## 2014-06-13 ENCOUNTER — Encounter: Payer: Self-pay | Admitting: Podiatry

## 2014-06-13 VITALS — BP 151/90 | HR 77 | Resp 12

## 2014-06-13 DIAGNOSIS — M722 Plantar fascial fibromatosis: Secondary | ICD-10-CM

## 2014-06-13 NOTE — Progress Notes (Signed)
Subjective:     Patient ID: Caroline Ward, female   DOB: [birth year], 54 y.o.   MRN: [id number]  HPI person undergoes for shockwave today left   Review of Systems     Objective:   Physical Exam Neurovascular status intact with pain left heel    Assessment:     Plantar fasciitis left    Plan:     Shockwave administered 5.0 on intensity 2500 shocks 16 frequency

## 2014-06-20 ENCOUNTER — Ambulatory Visit (INDEPENDENT_AMBULATORY_CARE_PROVIDER_SITE_OTHER): Payer: BLUE CROSS/BLUE SHIELD | Admitting: Podiatry

## 2014-06-20 DIAGNOSIS — M722 Plantar fascial fibromatosis: Secondary | ICD-10-CM

## 2014-06-20 NOTE — Progress Notes (Signed)
   Subjective:    Patient ID: Caroline Ward, female    DOB: [birth year], 54 y.o.   MRN: [id number]  HPI  CAM walker has helped. Still bothers patient when boot is off, when climbing up or down stairs, or when getting out of bed/off the couch. Becoming more accustomed to CAM walker  Review of Systems     Objective:   Physical Exam        Assessment & Plan:

## 2014-06-20 NOTE — Progress Notes (Signed)
Subjective:     Patient ID: Caroline Ward, female   DOB: September 24, 1960, 54 y.o.   MRN: [id number]  HPI patient states it's doing quite a bit better   Review of Systems     Objective:   Physical Exam Neurovascular status is intact with pain of the plantar heel which is improving when palpated    Assessment:     Improving plantar fasciitis left with shockwave    Plan:     Shockwave administered 3000 shocks 5.0 intensity 16 frequency

## 2014-06-24 ENCOUNTER — Other Ambulatory Visit: Payer: Self-pay | Admitting: Internal Medicine

## 2014-06-24 ENCOUNTER — Other Ambulatory Visit: Payer: Self-pay | Admitting: Family Medicine

## 2014-06-27 ENCOUNTER — Encounter: Payer: Self-pay | Admitting: Podiatry

## 2014-06-27 ENCOUNTER — Ambulatory Visit (INDEPENDENT_AMBULATORY_CARE_PROVIDER_SITE_OTHER): Payer: BLUE CROSS/BLUE SHIELD | Admitting: Podiatry

## 2014-06-27 VITALS — BP 164/92 | HR 62 | Resp 12

## 2014-06-27 DIAGNOSIS — M722 Plantar fascial fibromatosis: Secondary | ICD-10-CM

## 2014-07-03 NOTE — Progress Notes (Signed)
Subjective:     Patient ID: Caroline Ward, female   DOB: 06-14-1960, 54 y.o.   MRN: [id number]  HPI patient presents stating that my heel is improved from previous but present still   Review of Systems     Objective:   Physical Exam Neurovascular status intact muscle strength adequate range of motion within normal limits and heel that is improved left    Assessment:     Improving plantar fasciitis left    Plan:     3000 shocks 5.0 intensity 17 frequency administered and tolerated well

## 2014-07-20 ENCOUNTER — Encounter: Payer: Self-pay | Admitting: *Deleted

## 2014-07-20 ENCOUNTER — Telehealth: Payer: Self-pay | Admitting: *Deleted

## 2014-07-20 NOTE — Telephone Encounter (Signed)
Pre-Visit Call completed with patient and chart updated.   Pre-Visit Info documented in Specialty Comments under [employer].    

## 2014-07-21 ENCOUNTER — Ambulatory Visit (INDEPENDENT_AMBULATORY_CARE_PROVIDER_SITE_OTHER): Payer: BLUE CROSS/BLUE SHIELD | Admitting: Family Medicine

## 2014-07-21 ENCOUNTER — Encounter: Payer: Self-pay | Admitting: Family Medicine

## 2014-07-21 ENCOUNTER — Other Ambulatory Visit (HOSPITAL_COMMUNITY)
Admission: RE | Admit: 2014-07-21 | Discharge: 2014-07-21 | Disposition: A | Discharge status: Home / Self Care | Payer: BLUE CROSS/BLUE SHIELD | Source: Ambulatory Visit | Attending: Family Medicine | Admitting: Family Medicine

## 2014-07-21 VITALS — BP 156/98 | HR 102 | Temp 99.0°F | Resp 20 | Ht 68.0 in | Wt 306.6 lb

## 2014-07-21 DIAGNOSIS — Z23 Encounter for immunization: Secondary | ICD-10-CM | POA: Diagnosis not present

## 2014-07-21 DIAGNOSIS — N393 Stress incontinence (female) (male): Secondary | ICD-10-CM

## 2014-07-21 DIAGNOSIS — Z124 Encounter for screening for malignant neoplasm of cervix: Secondary | ICD-10-CM | POA: Diagnosis not present

## 2014-07-21 DIAGNOSIS — R32 Unspecified urinary incontinence: Secondary | ICD-10-CM

## 2014-07-21 DIAGNOSIS — Z01419 Encounter for gynecological examination (general) (routine) without abnormal findings: Secondary | ICD-10-CM | POA: Insufficient documentation

## 2014-07-21 DIAGNOSIS — Z0000 Encounter for general adult medical examination without abnormal findings: Secondary | ICD-10-CM

## 2014-07-21 DIAGNOSIS — Z1151 Encounter for screening for human papillomavirus (HPV): Secondary | ICD-10-CM | POA: Diagnosis present

## 2014-07-21 DIAGNOSIS — Z1211 Encounter for screening for malignant neoplasm of colon: Secondary | ICD-10-CM

## 2014-07-21 MED ORDER — MIRABEGRON ER 50 MG PO TB24: 50.0000 mg | ORAL_TABLET | Freq: Every day | ORAL | Status: DC

## 2014-07-21 NOTE — Patient Instructions (Signed)
Preventive Care for Adults A healthy lifestyle and preventive care can promote health and wellness. Preventive health guidelines for women include the following key practices.  A routine yearly physical is a good way to check with your health care provider about your health and preventive screening. It is a chance to share any concerns and updates on your health and to receive a thorough exam.  Visit your dentist for a routine exam and preventive care every 6 months. Brush your teeth twice a day and floss once a day. Good oral hygiene prevents tooth decay and gum disease.  The frequency of eye exams is based on your age, health, family medical history, use of contact lenses, and other factors. Follow your health care provider's recommendations for frequency of eye exams.  Eat a healthy diet. Foods like vegetables, fruits, whole grains, low-fat dairy products, and lean protein foods contain the nutrients you need without too many calories. Decrease your intake of foods high in solid fats, added sugars, and salt. Eat the right amount of calories for you.Get information about a proper diet from your health care provider, if necessary.  Regular physical exercise is one of the most important things you can do for your health. Most adults should get at least 150 minutes of moderate-intensity exercise (any activity that increases your heart rate and causes you to sweat) each week. In addition, most adults need muscle-strengthening exercises on 2 or more days a week.  Maintain a healthy weight. The body mass index (BMI) is a screening tool to identify possible weight problems. It provides an estimate of body fat based on height and weight. Your health care provider can find your BMI and can help you achieve or maintain a healthy weight.For adults 20 years and older:  A BMI below 18.5 is considered underweight.  A BMI of 18.5 to 24.9 is normal.  A BMI of 25 to 29.9 is considered overweight.  A BMI of  30 and above is considered obese.  Maintain normal blood lipids and cholesterol levels by exercising and minimizing your intake of saturated fat. Eat a balanced diet with plenty of fruit and vegetables. Blood tests for lipids and cholesterol should begin at age 76 and be repeated every 5 years. If your lipid or cholesterol levels are high, you are over 50, or you are at high risk for heart disease, you may need your cholesterol levels checked more frequently.Ongoing high lipid and cholesterol levels should be treated with medicines if diet and exercise are not working.  If you smoke, find out from your health care provider how to quit. If you do not use tobacco, do not start.  Lung cancer screening is recommended for adults aged 22-80 years who are at high risk for developing lung cancer because of a history of smoking. A yearly low-dose CT scan of the lungs is recommended for people who have at least a 30-pack-year history of smoking and are a current smoker or have quit within the past 15 years. A pack year of smoking is smoking an average of 1 pack of cigarettes a day for 1 year (for example: 1 pack a day for 30 years or 2 packs a day for 15 years). Yearly screening should continue until the smoker has stopped smoking for at least 15 years. Yearly screening should be stopped for people who develop a health problem that would prevent them from having lung cancer treatment.  If you are pregnant, do not drink alcohol. If you are breastfeeding,  be very cautious about drinking alcohol. If you are not pregnant and choose to drink alcohol, do not have more than 1 drink per day. One drink is considered to be 12 ounces (355 mL) of beer, 5 ounces (148 mL) of wine, or 1.5 ounces (44 mL) of liquor.  Avoid use of street drugs. Do not share needles with anyone. Ask for help if you need support or instructions about stopping the use of drugs.  High blood pressure causes heart disease and increases the risk of  stroke. Your blood pressure should be checked at least every 1 to 2 years. Ongoing high blood pressure should be treated with medicines if weight loss and exercise do not work.  If you are 3-86 years old, ask your health care provider if you should take aspirin to prevent strokes.  Diabetes screening involves taking a blood sample to check your fasting blood sugar level. This should be done once every 3 years, after age 67, if you are within normal weight and without risk factors for diabetes. Testing should be considered at a younger age or be carried out more frequently if you are overweight and have at least 1 risk factor for diabetes.  Breast cancer screening is essential preventive care for women. You should practice "breast self-awareness." This means understanding the normal appearance and feel of your breasts and may include breast self-examination. Any changes detected, no matter how small, should be reported to a health care provider. Women in their 8s and 30s should have a clinical breast exam (CBE) by a health care provider as part of a regular health exam every 1 to 3 years. After age 70, women should have a CBE every year. Starting at age 25, women should consider having a mammogram (breast X-ray test) every year. Women who have a family history of breast cancer should talk to their health care provider about genetic screening. Women at a high risk of breast cancer should talk to their health care providers about having an MRI and a mammogram every year.  Breast cancer gene (BRCA)-related cancer risk assessment is recommended for women who have family members with BRCA-related cancers. BRCA-related cancers include breast, ovarian, tubal, and peritoneal cancers. Having family members with these cancers may be associated with an increased risk for harmful changes (mutations) in the breast cancer genes BRCA1 and BRCA2. Results of the assessment will determine the need for genetic counseling and  BRCA1 and BRCA2 testing.  Routine pelvic exams to screen for cancer are no longer recommended for nonpregnant women who are considered low risk for cancer of the pelvic organs (ovaries, uterus, and vagina) and who do not have symptoms. Ask your health care provider if a screening pelvic exam is right for you.  If you have had past treatment for cervical cancer or a condition that could lead to cancer, you need Pap tests and screening for cancer for at least 20 years after your treatment. If Pap tests have been discontinued, your risk factors (such as having a new sexual partner) need to be reassessed to determine if screening should be resumed. Some women have medical problems that increase the chance of getting cervical cancer. In these cases, your health care provider may recommend more frequent screening and Pap tests.  The HPV test is an additional test that may be used for cervical cancer screening. The HPV test looks for the virus that can cause the cell changes on the cervix. The cells collected during the Pap test can be  tested for HPV. The HPV test could be used to screen women aged 30 years and older, and should be used in women of any age who have unclear Pap test results. After the age of 30, women should have HPV testing at the same frequency as a Pap test.  Colorectal cancer can be detected and often prevented. Most routine colorectal cancer screening begins at the age of 50 years and continues through age 75 years. However, your health care provider may recommend screening at an earlier age if you have risk factors for colon cancer. On a yearly basis, your health care provider may provide home test kits to check for hidden blood in the stool. Use of a small camera at the end of a tube, to directly examine the colon (sigmoidoscopy or colonoscopy), can detect the earliest forms of colorectal cancer. Talk to your health care provider about this at age 50, when routine screening begins. Direct  exam of the colon should be repeated every 5-10 years through age 75 years, unless early forms of pre-cancerous polyps or small growths are found.  People who are at an increased risk for hepatitis B should be screened for this virus. You are considered at high risk for hepatitis B if:  You were born in a country where hepatitis B occurs often. Talk with your health care provider about which countries are considered high risk.  Your parents were born in a high-risk country and you have not received a shot to protect against hepatitis B (hepatitis B vaccine).  You have HIV or AIDS.  You use needles to inject street drugs.  You live with, or have sex with, someone who has hepatitis B.  You get hemodialysis treatment.  You take certain medicines for conditions like cancer, organ transplantation, and autoimmune conditions.  Hepatitis C blood testing is recommended for all people born from 1945 through 1965 and any individual with known risks for hepatitis C.  Practice safe sex. Use condoms and avoid high-risk sexual practices to reduce the spread of sexually transmitted infections (STIs). STIs include gonorrhea, chlamydia, syphilis, trichomonas, herpes, HPV, and human immunodeficiency virus (HIV). Herpes, HIV, and HPV are viral illnesses that have no cure. They can result in disability, cancer, and death.  You should be screened for sexually transmitted illnesses (STIs) including gonorrhea and chlamydia if:  You are sexually active and are younger than 24 years.  You are older than 24 years and your health care provider tells you that you are at risk for this type of infection.  Your sexual activity has changed since you were last screened and you are at an increased risk for chlamydia or gonorrhea. Ask your health care provider if you are at risk.  If you are at risk of being infected with HIV, it is recommended that you take a prescription medicine daily to prevent HIV infection. This is  called preexposure prophylaxis (PrEP). You are considered at risk if:  You are a heterosexual woman, are sexually active, and are at increased risk for HIV infection.  You take drugs by injection.  You are sexually active with a partner who has HIV.  Talk with your health care provider about whether you are at high risk of being infected with HIV. If you choose to begin PrEP, you should first be tested for HIV. You should then be tested every 3 months for as long as you are taking PrEP.  Osteoporosis is a disease in which the bones lose minerals and strength   with aging. This can result in serious bone fractures or breaks. The risk of osteoporosis can be identified using a bone density scan. Women ages 65 years and over and women at risk for fractures or osteoporosis should discuss screening with their health care providers. Ask your health care provider whether you should take a calcium supplement or vitamin D to reduce the rate of osteoporosis.  Menopause can be associated with physical symptoms and risks. Hormone replacement therapy is available to decrease symptoms and risks. You should talk to your health care provider about whether hormone replacement therapy is right for you.  Use sunscreen. Apply sunscreen liberally and repeatedly throughout the day. You should seek shade when your shadow is shorter than you. Protect yourself by wearing long sleeves, pants, a wide-brimmed hat, and sunglasses year round, whenever you are outdoors.  Once a month, do a whole body skin exam, using a mirror to look at the skin on your back. Tell your health care provider of new moles, moles that have irregular borders, moles that are larger than a pencil eraser, or moles that have changed in shape or color.  Stay current with required vaccines (immunizations).  Influenza vaccine. All adults should be immunized every year.  Tetanus, diphtheria, and acellular pertussis (Td, Tdap) vaccine. Pregnant women should  receive 1 dose of Tdap vaccine during each pregnancy. The dose should be obtained regardless of the length of time since the last dose. Immunization is preferred during the 27th-36th week of gestation. An adult who has not previously received Tdap or who does not know her vaccine status should receive 1 dose of Tdap. This initial dose should be followed by tetanus and diphtheria toxoids (Td) booster doses every 10 years. Adults with an unknown or incomplete history of completing a 3-dose immunization series with Td-containing vaccines should begin or complete a primary immunization series including a Tdap dose. Adults should receive a Td booster every 10 years.  Varicella vaccine. An adult without evidence of immunity to varicella should receive 2 doses or a second dose if she has previously received 1 dose. Pregnant females who do not have evidence of immunity should receive the first dose after pregnancy. This first dose should be obtained before leaving the health care facility. The second dose should be obtained 4-8 weeks after the first dose.  Human papillomavirus (HPV) vaccine. Females aged 13-26 years who have not received the vaccine previously should obtain the 3-dose series. The vaccine is not recommended for use in pregnant females. However, pregnancy testing is not needed before receiving a dose. If a female is found to be pregnant after receiving a dose, no treatment is needed. In that case, the remaining doses should be delayed until after the pregnancy. Immunization is recommended for any person with an immunocompromised condition through the age of 26 years if she did not get any or all doses earlier. During the 3-dose series, the second dose should be obtained 4-8 weeks after the first dose. The third dose should be obtained 24 weeks after the first dose and 16 weeks after the second dose.  Zoster vaccine. One dose is recommended for adults aged 60 years or older unless certain conditions are  present.  Measles, mumps, and rubella (MMR) vaccine. Adults born before 1957 generally are considered immune to measles and mumps. Adults born in 1957 or later should have 1 or more doses of MMR vaccine unless there is a contraindication to the vaccine or there is laboratory evidence of immunity to   each of the three diseases. A routine second dose of MMR vaccine should be obtained at least 28 days after the first dose for students attending postsecondary schools, health care workers, or international travelers. People who received inactivated measles vaccine or an unknown type of measles vaccine during 1963-1967 should receive 2 doses of MMR vaccine. People who received inactivated mumps vaccine or an unknown type of mumps vaccine before 1979 and are at high risk for mumps infection should consider immunization with 2 doses of MMR vaccine. For females of childbearing age, rubella immunity should be determined. If there is no evidence of immunity, females who are not pregnant should be vaccinated. If there is no evidence of immunity, females who are pregnant should delay immunization until after pregnancy. Unvaccinated health care workers born before 1957 who lack laboratory evidence of measles, mumps, or rubella immunity or laboratory confirmation of disease should consider measles and mumps immunization with 2 doses of MMR vaccine or rubella immunization with 1 dose of MMR vaccine.  Pneumococcal 13-valent conjugate (PCV13) vaccine. When indicated, a person who is uncertain of her immunization history and has no record of immunization should receive the PCV13 vaccine. An adult aged 19 years or older who has certain medical conditions and has not been previously immunized should receive 1 dose of PCV13 vaccine. This PCV13 should be followed with a dose of pneumococcal polysaccharide (PPSV23) vaccine. The PPSV23 vaccine dose should be obtained at least 8 weeks after the dose of PCV13 vaccine. An adult aged 19  years or older who has certain medical conditions and previously received 1 or more doses of PPSV23 vaccine should receive 1 dose of PCV13. The PCV13 vaccine dose should be obtained 1 or more years after the last PPSV23 vaccine dose.  Pneumococcal polysaccharide (PPSV23) vaccine. When PCV13 is also indicated, PCV13 should be obtained first. All adults aged 65 years and older should be immunized. An adult younger than age 65 years who has certain medical conditions should be immunized. Any person who resides in a nursing home or long-term care facility should be immunized. An adult smoker should be immunized. People with an immunocompromised condition and certain other conditions should receive both PCV13 and PPSV23 vaccines. People with human immunodeficiency virus (HIV) infection should be immunized as soon as possible after diagnosis. Immunization during chemotherapy or radiation therapy should be avoided. Routine use of PPSV23 vaccine is not recommended for American Indians, [place] Natives, or people younger than 65 years unless there are medical conditions that require PPSV23 vaccine. When indicated, people who have unknown immunization and have no record of immunization should receive PPSV23 vaccine. One-time revaccination 5 years after the first dose of PPSV23 is recommended for people aged 19-64 years who have chronic kidney failure, nephrotic syndrome, asplenia, or immunocompromised conditions. People who received 1-2 doses of PPSV23 before age 65 years should receive another dose of PPSV23 vaccine at age 65 years or later if at least 5 years have passed since the previous dose. Doses of PPSV23 are not needed for people immunized with PPSV23 at or after age 65 years.  Meningococcal vaccine. Adults with asplenia or persistent complement component deficiencies should receive 2 doses of quadrivalent meningococcal conjugate (MenACWY-D) vaccine. The doses should be obtained at least 2 months apart.  Microbiologists working with certain meningococcal bacteria, military recruits, people at risk during an outbreak, and people who travel to or live in countries with a high rate of meningitis should be immunized. A first-year college student up through age   21 years who is living in a residence hall should receive a dose if she did not receive a dose on or after her 16th birthday. Adults who have certain high-risk conditions should receive one or more doses of vaccine.  Hepatitis A vaccine. Adults who wish to be protected from this disease, have certain high-risk conditions, work with hepatitis A-infected animals, work in hepatitis A research labs, or travel to or work in countries with a high rate of hepatitis A should be immunized. Adults who were previously unvaccinated and who anticipate close contact with an international adoptee during the first 60 days after arrival in the [place] from a country with a high rate of hepatitis A should be immunized.  Hepatitis B vaccine. Adults who wish to be protected from this disease, have certain high-risk conditions, may be exposed to blood or other infectious body fluids, are household contacts or sex partners of hepatitis B positive people, are clients or workers in certain care facilities, or travel to or work in countries with a high rate of hepatitis B should be immunized.  Haemophilus influenzae type b (Hib) vaccine. A previously unvaccinated person with asplenia or sickle cell disease or having a scheduled splenectomy should receive 1 dose of Hib vaccine. Regardless of previous immunization, a recipient of a hematopoietic stem cell transplant should receive a 3-dose series 6-12 months after her successful transplant. Hib vaccine is not recommended for adults with HIV infection. Preventive Services / Frequency Ages 64 to 68 years  Blood pressure check.** / Every 1 to 2 years.  Lipid and cholesterol check.** / Every 5 years beginning at age  22.  Clinical breast exam.** / Every 3 years for women in their 88s and 53s.  BRCA-related cancer risk assessment.** / For women who have family members with a BRCA-related cancer (breast, ovarian, tubal, or peritoneal cancers).  Pap test.** / Every 2 years from ages 90 through 51. Every 3 years starting at age 21 through age 56 or 3 with a history of 3 consecutive normal Pap tests.  HPV screening.** / Every 3 years from ages 24 through ages 1 to 46 with a history of 3 consecutive normal Pap tests.  Hepatitis C blood test.** / For any individual with known risks for hepatitis C.  Skin self-exam. / Monthly.  Influenza vaccine. / Every year.  Tetanus, diphtheria, and acellular pertussis (Tdap, Td) vaccine.** / Consult your health care provider. Pregnant women should receive 1 dose of Tdap vaccine during each pregnancy. 1 dose of Td every 10 years.  Varicella vaccine.** / Consult your health care provider. Pregnant females who do not have evidence of immunity should receive the first dose after pregnancy.  HPV vaccine. / 3 doses over 6 months, if 72 and younger. The vaccine is not recommended for use in pregnant females. However, pregnancy testing is not needed before receiving a dose.  Measles, mumps, rubella (MMR) vaccine.** / You need at least 1 dose of MMR if you were born in 1957 or later. You may also need a 2nd dose. For females of childbearing age, rubella immunity should be determined. If there is no evidence of immunity, females who are not pregnant should be vaccinated. If there is no evidence of immunity, females who are pregnant should delay immunization until after pregnancy.  Pneumococcal 13-valent conjugate (PCV13) vaccine.** / Consult your health care provider.  Pneumococcal polysaccharide (PPSV23) vaccine.** / 1 to 2 doses if you smoke cigarettes or if you have certain conditions.  Meningococcal vaccine.** /  1 dose if you are age 19 to 21 years and a first-year college  student living in a residence hall, or have one of several medical conditions, you need to get vaccinated against meningococcal disease. You may also need additional booster doses.  Hepatitis A vaccine.** / Consult your health care provider.  Hepatitis B vaccine.** / Consult your health care provider.  Haemophilus influenzae type b (Hib) vaccine.** / Consult your health care provider. Ages 40 to 64 years  Blood pressure check.** / Every 1 to 2 years.  Lipid and cholesterol check.** / Every 5 years beginning at age 20 years.  Lung cancer screening. / Every year if you are aged 55-80 years and have a 30-pack-year history of smoking and currently smoke or have quit within the past 15 years. Yearly screening is stopped once you have quit smoking for at least 15 years or develop a health problem that would prevent you from having lung cancer treatment.  Clinical breast exam.** / Every year after age 40 years.  BRCA-related cancer risk assessment.** / For women who have family members with a BRCA-related cancer (breast, ovarian, tubal, or peritoneal cancers).  Mammogram.** / Every year beginning at age 40 years and continuing for as long as you are in good health. Consult with your health care provider.  Pap test.** / Every 3 years starting at age 30 years through age 65 or 70 years with a history of 3 consecutive normal Pap tests.  HPV screening.** / Every 3 years from ages 30 years through ages 65 to 70 years with a history of 3 consecutive normal Pap tests.  Fecal occult blood test (FOBT) of stool. / Every year beginning at age 50 years and continuing until age 75 years. You may not need to do this test if you get a colonoscopy every 10 years.  Flexible sigmoidoscopy or colonoscopy.** / Every 5 years for a flexible sigmoidoscopy or every 10 years for a colonoscopy beginning at age 50 years and continuing until age 75 years.  Hepatitis C blood test.** / For all people born from 1945 through  1965 and any individual with known risks for hepatitis C.  Skin self-exam. / Monthly.  Influenza vaccine. / Every year.  Tetanus, diphtheria, and acellular pertussis (Tdap/Td) vaccine.** / Consult your health care provider. Pregnant women should receive 1 dose of Tdap vaccine during each pregnancy. 1 dose of Td every 10 years.  Varicella vaccine.** / Consult your health care provider. Pregnant females who do not have evidence of immunity should receive the first dose after pregnancy.  Zoster vaccine.** / 1 dose for adults aged 60 years or older.  Measles, mumps, rubella (MMR) vaccine.** / You need at least 1 dose of MMR if you were born in 1957 or later. You may also need a 2nd dose. For females of childbearing age, rubella immunity should be determined. If there is no evidence of immunity, females who are not pregnant should be vaccinated. If there is no evidence of immunity, females who are pregnant should delay immunization until after pregnancy.  Pneumococcal 13-valent conjugate (PCV13) vaccine.** / Consult your health care provider.  Pneumococcal polysaccharide (PPSV23) vaccine.** / 1 to 2 doses if you smoke cigarettes or if you have certain conditions.  Meningococcal vaccine.** / Consult your health care provider.  Hepatitis A vaccine.** / Consult your health care provider.  Hepatitis B vaccine.** / Consult your health care provider.  Haemophilus influenzae type b (Hib) vaccine.** / Consult your health care provider. Ages 65   years and over  Blood pressure check.** / Every 1 to 2 years.  Lipid and cholesterol check.** / Every 5 years beginning at age 22 years.  Lung cancer screening. / Every year if you are aged 73-80 years and have a 30-pack-year history of smoking and currently smoke or have quit within the past 15 years. Yearly screening is stopped once you have quit smoking for at least 15 years or develop a health problem that would prevent you from having lung cancer  treatment.  Clinical breast exam.** / Every year after age 4 years.  BRCA-related cancer risk assessment.** / For women who have family members with a BRCA-related cancer (breast, ovarian, tubal, or peritoneal cancers).  Mammogram.** / Every year beginning at age 40 years and continuing for as long as you are in good health. Consult with your health care provider.  Pap test.** / Every 3 years starting at age 9 years through age 34 or 91 years with 3 consecutive normal Pap tests. Testing can be stopped between 65 and 70 years with 3 consecutive normal Pap tests and no abnormal Pap or HPV tests in the past 10 years.  HPV screening.** / Every 3 years from ages 57 years through ages 64 or 45 years with a history of 3 consecutive normal Pap tests. Testing can be stopped between 65 and 70 years with 3 consecutive normal Pap tests and no abnormal Pap or HPV tests in the past 10 years.  Fecal occult blood test (FOBT) of stool. / Every year beginning at age 15 years and continuing until age 17 years. You may not need to do this test if you get a colonoscopy every 10 years.  Flexible sigmoidoscopy or colonoscopy.** / Every 5 years for a flexible sigmoidoscopy or every 10 years for a colonoscopy beginning at age 86 years and continuing until age 71 years.  Hepatitis C blood test.** / For all people born from [birth year] through 1963 and any individual with known risks for hepatitis C.  Osteoporosis screening.** / A one-time screening for women ages 83 years and over and women at risk for fractures or osteoporosis.  Skin self-exam. / Monthly.  Influenza vaccine. / Every year.  Tetanus, diphtheria, and acellular pertussis (Tdap/Td) vaccine.** / 1 dose of Td every 10 years.  Varicella vaccine.** / Consult your health care provider.  Zoster vaccine.** / 1 dose for adults aged 61 years or older.  Pneumococcal 13-valent conjugate (PCV13) vaccine.** / Consult your health care provider.  Pneumococcal  polysaccharide (PPSV23) vaccine.** / 1 dose for all adults aged 28 years and older.  Meningococcal vaccine.** / Consult your health care provider.  Hepatitis A vaccine.** / Consult your health care provider.  Hepatitis B vaccine.** / Consult your health care provider.  Haemophilus influenzae type b (Hib) vaccine.** / Consult your health care provider. ** Family history and personal history of risk and conditions may change your health care provider's recommendations. Document Released: 02/25/2001 Document Revised: 05/16/2013 Document Reviewed: 05/27/2010 [facility] Patient Information 2015 [place], [place]. This information is not intended to replace advice given to you by your health care provider. Make sure you discuss any questions you have with your health care provider.

## 2014-07-21 NOTE — Progress Notes (Signed)
Pre visit review using our clinic review tool, if applicable. No additional management support is needed unless otherwise documented below in the visit note. 

## 2014-07-21 NOTE — Progress Notes (Signed)
Subjective:     Caroline Ward is a 54 y.o. female and is here for a comprehensive physical exam. The patient reports problems - urinary incontinence that is worsening.  History   Social History  . Marital Status: Single    Spouse Name: N/A  . Number of Children: 0  . Years of Education: N/A   Occupational History  . self employed-- Medical laboratory scientific officer    Social History Main Topics  . Smoking status: Never Smoker   . Smokeless tobacco: Never Used  . Alcohol Use: 0.0 oz/week    0 Standard drinks or equivalent per week     Comment: Occ  . Drug Use: No  . Sexual Activity:    Partners: Male   Other Topics Concern  . Not on file   Social History Narrative   Exercise--no   Health Maintenance  Topic Date Due  . Samul Dada  10/11/2012  . COLONOSCOPY  01/06/2013  . PAP SMEAR  05/19/2014  . HIV Screening  07/21/2015 (Originally 01/06/1976)  . INFLUENZA VACCINE  08/14/2014  . MAMMOGRAM  02/22/2015    The following portions of the patient's history were reviewed and updated as appropriate:  She  has a past medical history of Depression; Anxiety; Migraine; Heart murmur; and Asthma. She  does not have any pertinent problems on file. She  has no past surgical history on file. Her family history includes Alcohol abuse in her father; Breast cancer in her maternal aunt; COPD in her mother; Cancer in her paternal uncle; Dementia in her maternal aunt; Hyperlipidemia in her mother; Hypertension in her mother; Liver disease in her maternal aunt; Lung cancer in her father; Other in her brother; Pancreatic cancer in her brother; Throat cancer in her father. She  reports that she has never smoked. She has never used smokeless tobacco. She reports that she drinks alcohol. She reports that she does not use illicit drugs. She has a current medication list which includes the following prescription(s): alprazolam, beclomethasone, calcium-magnesium-vitamin d, citalopram, famotidine, fluticasone,  multivitamin, naproxen, fish oil, and pantoprazole. Current Outpatient Prescriptions on File Prior to Visit  Medication Sig Dispense Refill  . ALPRAZolam (XANAX) 0.25 MG tablet TAKE 1 TABLET BY MOUTH ONCE DAILY AS NEEDED FOR SLEEP 30 tablet 0  . beclomethasone (QVAR) 80 MCG/ACT inhaler Inhale 1 puff into the lungs daily.    . Calcium-Magnesium-Vitamin D [id number] MG-MG-UNIT CHEW Chew 1 capsule by mouth daily.    . citalopram (CELEXA) 20 MG tablet TAKE 1 TABLET BY MOUTH DAILY 30 tablet 5  . famotidine (PEPCID) 20 MG tablet One at bedtime 30 tablet 11  . fluticasone (FLONASE) 50 MCG/ACT nasal spray Place 2 sprays into both nostrils daily. 16 g 6  . Multiple Vitamin (MULTIVITAMIN) capsule Take 1 capsule by mouth daily.    . naproxen (NAPROSYN) 500 MG tablet TAKE 1 TABLET BY MOUTH TWICE DAILY WITH MEALS 180 tablet 0  . pantoprazole (PROTONIX) 40 MG tablet TAKE 1 TABLET BY MOUTH EVERY DAY 30 TO 60 MINUTES BEFORE FIRST MEAL OF THE DAY 30 tablet 0   No current facility-administered medications on file prior to visit.   She has No Known Allergies..  Review of Systems Review of Systems  Constitutional: Negative for activity change, appetite change and fatigue.  HENT: Negative for hearing loss, congestion, tinnitus and ear discharge.  dentist q10m Eyes: Negative for visual disturbance (see optho q1y -- vision corrected to 20/20 with glasses).  Respiratory: Negative for cough, chest tightness and shortness of breath.  Cardiovascular: Negative for chest pain, palpitations and leg swelling.  Gastrointestinal: Negative for abdominal pain, diarrhea, constipation and abdominal distention.  Genitourinary: Negative for urgency, frequency, decreased urine volume and difficulty urinating.  Musculoskeletal: Negative for back pain, arthralgias and gait problem.  Skin: Negative for color change, pallor and rash.  Neurological: Negative for dizziness, light-headedness, numbness and headaches.  Hematological:  Negative for adenopathy. Does not bruise/bleed easily.  Psychiatric/Behavioral: Negative for suicidal ideas, confusion, sleep disturbance, self-injury, dysphoric mood, decreased concentration and agitation.       Objective:    BP 156/98 mmHg  Pulse 102  Temp(Src) 99 F (37.2 C) (Oral)  Resp 20  Ht 5\' 8"  (1.727 m)  Wt 306 lb 9.6 oz (139.073 kg)  BMI 46.63 kg/m2  SpO2 99% General appearance: alert, cooperative, appears stated age and no distress Head: Normocephalic, without obvious abnormality, atraumatic Eyes: conjunctivae/corneas clear. PERRL, EOM's intact. Fundi benign. Ears: normal TM's and external ear canals both ears Nose: Nares normal. Septum midline. Mucosa normal. No drainage or sinus tenderness. Throat: lips, mucosa, and tongue normal; teeth and gums normal Neck: no adenopathy, no carotid bruit, no JVD, supple, symmetrical, trachea midline and thyroid not enlarged, symmetric, no tenderness/mass/nodules Back: symmetric, no curvature. ROM normal. No CVA tenderness. Lungs: clear to auscultation bilaterally Breasts: normal appearance, no masses or tenderness Heart: S1, S2 normal Abdomen: soft, non-tender; bowel sounds normal; no masses,  no organomegaly Pelvic: cervix normal in appearance, external genitalia normal, no adnexal masses or tenderness, no cervical motion tenderness, rectovaginal septum normal, uterus normal size, shape, and consistency, vagina normal without discharge and pap done Extremities: extremities normal, atraumatic, no cyanosis or edema Pulses: 2+ and symmetric Skin: Skin color, texture, turgor normal. No rashes or lesions Lymph nodes: Cervical, supraclavicular, and axillary nodes normal. Neurologic: Alert and oriented X 3, normal strength and tone. Normal symmetric reflexes. Normal coordination and gait Psych- no depression, no anxiety      Assessment:    Healthy female exam.       Plan:    ghm utd Check labs See After Visit Summary for  Counseling Recommendations    1. Colon cancer screening  - Ambulatory referral to Gastroenterology  2. Preventative health care ghm utd Check labs - Basic metabolic panel; Future - CBC with Differential/Platelet; Future - Hepatic function panel; Future - Lipid panel; Future - POCT urinalysis dipstick; Future - TSH; Future  3. Screening for malignant neoplasm of cervix  - Cytology - PAP  4. Incontinence in female   - mirabegron ER (MYRBETRIQ) 50 MG TB24 tablet; Take 1 tablet (50 mg total) by mouth daily.  Dispense: 30 tablet; Refill: 11  5. Need for diphtheria-tetanus-pertussis (Tdap) vaccine, adult/adolescent   - Tdap vaccine greater than or equal to 7yo IM

## 2014-07-24 ENCOUNTER — Other Ambulatory Visit: Payer: BLUE CROSS/BLUE SHIELD

## 2014-07-24 ENCOUNTER — Encounter: Payer: Self-pay | Admitting: Gastroenterology

## 2014-07-24 ENCOUNTER — Telehealth: Payer: Self-pay | Admitting: *Deleted

## 2014-07-24 NOTE — Telephone Encounter (Signed)
Prior authorization for Myrbetriq initiated. Awaiting determination. JG//CMA

## 2014-07-25 ENCOUNTER — Other Ambulatory Visit (INDEPENDENT_AMBULATORY_CARE_PROVIDER_SITE_OTHER): Payer: BLUE CROSS/BLUE SHIELD

## 2014-07-25 ENCOUNTER — Ambulatory Visit (INDEPENDENT_AMBULATORY_CARE_PROVIDER_SITE_OTHER): Payer: BLUE CROSS/BLUE SHIELD | Admitting: Podiatry

## 2014-07-25 ENCOUNTER — Other Ambulatory Visit: Payer: Self-pay | Admitting: Internal Medicine

## 2014-07-25 ENCOUNTER — Encounter: Payer: Self-pay | Admitting: Podiatry

## 2014-07-25 DIAGNOSIS — M722 Plantar fascial fibromatosis: Secondary | ICD-10-CM

## 2014-07-25 DIAGNOSIS — Z0000 Encounter for general adult medical examination without abnormal findings: Secondary | ICD-10-CM | POA: Diagnosis not present

## 2014-07-25 LAB — BASIC METABOLIC PANEL
BUN: 10 mg/dL (ref 6–23)
CO2: 27 mEq/L (ref 19–32)
Calcium: 9.1 mg/dL (ref 8.4–10.5)
Chloride: 100 mEq/L (ref 96–112)
Creatinine, Ser: 0.65 mg/dL (ref 0.40–1.20)
GFR: 101.91 mL/min (ref >60.00)
Glucose, Bld: 85 mg/dL (ref 70–99)
Potassium: 4.2 mEq/L (ref 3.5–5.1)
SODIUM: 135 meq/L (ref 135–145)

## 2014-07-25 LAB — CBC WITH DIFFERENTIAL/PLATELET
BASOS ABS: 0 10*3/uL (ref 0.0–0.1)
BASOS PCT: 0.4 % (ref 0.0–3.0)
EOS ABS: 0.5 10*3/uL (ref 0.0–0.7)
EOS PCT: 3.8 % (ref 0.0–5.0)
HEMATOCRIT: 40.8 % (ref 36.0–46.0)
HEMOGLOBIN: 13.3 g/dL (ref 12.0–15.0)
Lymphocytes Relative: 22.5 % (ref 12.0–46.0)
Lymphs Abs: 2.7 10*3/uL (ref 0.7–4.0)
MCHC: 32.5 g/dL (ref 30.0–36.0)
MCV: 82.6 fl (ref 78.0–100.0)
MONO ABS: 0.7 10*3/uL (ref 0.1–1.0)
MONOS PCT: 5.9 % (ref 3.0–12.0)
NEUTROS PCT: 67.4 % (ref 43.0–77.0)
Neutro Abs: 8.1 10*3/uL — ABNORMAL HIGH (ref 1.4–7.7)
Platelets: 317 10*3/uL (ref 150.0–400.0)
RBC: 4.94 Mil/uL (ref 3.87–5.11)
RDW: 14.3 % (ref 11.5–15.5)
WBC: 12 10*3/uL — ABNORMAL HIGH (ref 4.0–10.5)

## 2014-07-25 LAB — HEPATIC FUNCTION PANEL
ALBUMIN: 3.9 g/dL (ref 3.5–5.2)
ALK PHOS: 89 U/L (ref 39–117)
ALT: 26 U/L (ref 0–35)
AST: 21 U/L (ref 0–37)
BILIRUBIN TOTAL: 0.4 mg/dL (ref 0.2–1.2)
Bilirubin, Direct: 0 mg/dL (ref 0.0–0.3)
Total Protein: 7.1 g/dL (ref 6.0–8.3)

## 2014-07-25 LAB — CYTOLOGY - PAP

## 2014-07-25 LAB — LIPID PANEL
CHOL/HDL RATIO: 5
CHOLESTEROL: 213 mg/dL — AB (ref 0–200)
HDL: 43.8 mg/dL (ref >39.00)
LDL Cholesterol: 143 mg/dL — ABNORMAL HIGH (ref 0–99)
NONHDL: 169.2
TRIGLYCERIDES: 132 mg/dL (ref 0.0–149.0)
VLDL: 26.4 mg/dL (ref 0.0–40.0)

## 2014-07-25 LAB — TSH: TSH: 1.93 u[IU]/mL (ref 0.35–4.50)

## 2014-07-25 NOTE — Progress Notes (Signed)
Subjective:     Patient ID: Caroline Ward, female   DOB: [birth year], 54 y.o.   MRN: [id number]  HPI patient presents for shockwave therapy left   Review of Systems     Objective:   Physical Exam Neurovascular status intact with diminished pain in the plantar heel left    Assessment:     Improved plantar fasciitis left    Plan:     Shockwave administered 3000 shocks 4.9 intensity 15 frequency

## 2014-08-08 NOTE — Telephone Encounter (Signed)
[employer] and they stated that PA is still with clinical dept and will fax [place] a decision once one is made. JG//CMA

## 2014-08-08 NOTE — Telephone Encounter (Addendum)
elation to GE:FUWT  Call back number: [phone] Pharmacy: [phone]  Reason for call:  Patient checking on the status of prior auth.

## 2014-08-15 ENCOUNTER — Other Ambulatory Visit (INDEPENDENT_AMBULATORY_CARE_PROVIDER_SITE_OTHER): Payer: BLUE CROSS/BLUE SHIELD

## 2014-08-15 DIAGNOSIS — D72829 Elevated white blood cell count, unspecified: Secondary | ICD-10-CM

## 2014-08-15 LAB — CBC WITH DIFFERENTIAL/PLATELET
Basophils Absolute: 0.1 10*3/uL (ref 0.0–0.1)
Basophils Relative: 0.5 % (ref 0.0–3.0)
EOS PCT: 3.2 % (ref 0.0–5.0)
Eosinophils Absolute: 0.4 10*3/uL (ref 0.0–0.7)
HCT: 40.3 % (ref 36.0–46.0)
Hemoglobin: 13.2 g/dL (ref 12.0–15.0)
LYMPHS ABS: 2.4 10*3/uL (ref 0.7–4.0)
LYMPHS PCT: 20.7 % (ref 12.0–46.0)
MCHC: 32.9 g/dL (ref 30.0–36.0)
MCV: 82.3 fl (ref 78.0–100.0)
Monocytes Absolute: 0.7 10*3/uL (ref 0.1–1.0)
Monocytes Relative: 5.7 % (ref 3.0–12.0)
NEUTROS ABS: 8.2 10*3/uL — AB (ref 1.4–7.7)
Neutrophils Relative %: 69.9 % (ref 43.0–77.0)
Platelets: 323 10*3/uL (ref 150.0–400.0)
RBC: 4.89 Mil/uL (ref 3.87–5.11)
RDW: 14.1 % (ref 11.5–15.5)
WBC: 11.7 10*3/uL — ABNORMAL HIGH (ref 4.0–10.5)

## 2014-08-15 NOTE — Telephone Encounter (Signed)
Approved effective from 08/15/2014 through 01/12/2038.

## 2014-08-15 NOTE — Telephone Encounter (Signed)
Caller name: Lovelle Deitrick Relationship to patient: self Can be reached: [phone]  Reason for call: Pt states that she has contacted her insurance twice, 08/08/14 and 08/15/14. Call confirmation code 08/08/14: [id number] & call confirmation code 08/15/14: [id number]. They state they have not received a request for PA on Myrbetriq. She states if it is going to continue to be delayed maybe she needs a different medication for incontinence. Please follow up ASAP and call pt with the details of call to insurance.

## 2014-08-15 NOTE — Telephone Encounter (Signed)
Caroline Ward --please see pt response--- and call her--- her Ins co is telling her something different--- if we dont' get a response today I will need to call something else in for her

## 2014-08-18 ENCOUNTER — Other Ambulatory Visit: Payer: Self-pay | Admitting: Family Medicine

## 2014-08-18 DIAGNOSIS — R7989 Other specified abnormal findings of blood chemistry: Secondary | ICD-10-CM

## 2014-08-28 ENCOUNTER — Other Ambulatory Visit: Payer: Self-pay | Admitting: Internal Medicine

## 2014-09-19 ENCOUNTER — Ambulatory Visit (AMBULATORY_SURGERY_CENTER): Payer: Self-pay

## 2014-09-19 VITALS — Ht 68.0 in | Wt 305.2 lb

## 2014-09-19 DIAGNOSIS — Z8371 Family history of colonic polyps: Secondary | ICD-10-CM

## 2014-09-19 NOTE — Progress Notes (Signed)
Per pt, no allergies to soy or egg products.Pt not taking any weight loss meds or using  O2 at home. 

## 2014-09-27 ENCOUNTER — Other Ambulatory Visit: Payer: Self-pay | Admitting: Family Medicine

## 2014-09-29 ENCOUNTER — Telehealth: Payer: Self-pay | Admitting: Family Medicine

## 2014-09-29 NOTE — Telephone Encounter (Signed)
Relation to GZ:FPOI Call back number: [phone]  Pharmacy: [id number] - [place], [place] AT [address] [phone] (Phone) [phone] (Fax)         Reason for call:  Patient states she would like to stop taking mirabegron ER (MYRBETRIQ) 50 MG TB24 tablet, patient expercicing an allergic reaction. Last time patient took medication was on Wednesday 09/27/14. Please advise

## 2014-09-29 NOTE — Telephone Encounter (Signed)
Patient returning your call.

## 2014-09-29 NOTE — Telephone Encounter (Signed)
Please contact pt and let her know that Dr. Etter Sjogren is out of the office today.  I am covering and would recommend referral to urology.  If she is agreeable, please let me know and I will place referral. Would defer med changes to urology.

## 2014-09-29 NOTE — Telephone Encounter (Signed)
Called patient and she stated that she was having itching all over her body and in her throat.  No difficulty swallowing or shortness of breath.  She stopped taking the medication after Wednesday, thinks it was causing this reaction.  Added to allergies.  Patient requesting a new medication.   Please advise.

## 2014-09-29 NOTE — Telephone Encounter (Signed)
Called patient at Call back number: [phone] and left message to return call.

## 2014-09-29 NOTE — Telephone Encounter (Signed)
FYI- Spoke to patient who stated she would not like to go to Urology.  She would like to wait until Dr. Etter Sjogren is in office.  She does not feel like her issues are urgent and she thought the medication was working, but she just can not take that type anymore.

## 2014-09-30 NOTE — Telephone Encounter (Signed)
Can try vesicare 10 mg #30  1 po qd---  If that does not work ---urology refer is advised

## 2014-10-02 MED ORDER — SOLIFENACIN SUCCINATE 10 MG PO TABS: 10.0000 mg | ORAL_TABLET | Freq: Every day | ORAL | Status: DC

## 2014-10-02 NOTE — Telephone Encounter (Signed)
Notified patient and Rx sent to [facility] in [place] per patient preference.

## 2014-10-03 ENCOUNTER — Encounter: Payer: Self-pay | Admitting: Gastroenterology

## 2014-10-03 ENCOUNTER — Ambulatory Visit (AMBULATORY_SURGERY_CENTER): Payer: BLUE CROSS/BLUE SHIELD | Admitting: Gastroenterology

## 2014-10-03 VITALS — BP 148/77 | HR 77 | Temp 96.9°F | Resp 18 | Ht 68.0 in | Wt 305.0 lb

## 2014-10-03 DIAGNOSIS — Z1211 Encounter for screening for malignant neoplasm of colon: Secondary | ICD-10-CM

## 2014-10-03 DIAGNOSIS — K635 Polyp of colon: Secondary | ICD-10-CM | POA: Diagnosis not present

## 2014-10-03 DIAGNOSIS — Z8371 Family history of colonic polyps: Secondary | ICD-10-CM

## 2014-10-03 DIAGNOSIS — D125 Benign neoplasm of sigmoid colon: Secondary | ICD-10-CM | POA: Diagnosis not present

## 2014-10-03 MED ORDER — SODIUM CHLORIDE 0.9 % IV SOLN: 500.0000 mL | INTRAVENOUS | Status: DC

## 2014-10-03 NOTE — Progress Notes (Signed)
Called to room to assist during endoscopic procedure.  Patient ID and intended procedure confirmed with present staff. Received instructions for my participation in the procedure from the performing physician.  

## 2014-10-03 NOTE — Patient Instructions (Signed)
YOU HAD AN ENDOSCOPIC PROCEDURE TODAY AT [facility]:   Refer to the procedure report that was given to you for any specific questions about what was found during the examination.  If the procedure report does not answer your questions, please call your gastroenterologist to clarify.  If you requested that your care partner not be given the details of your procedure findings, then the procedure report has been included in a sealed envelope for you to review at your convenience later.  YOU SHOULD EXPECT: Some feelings of bloating in the abdomen. Passage of more gas than usual.  Walking can help get rid of the air that was put into your GI tract during the procedure and reduce the bloating. If you had a lower endoscopy (such as a colonoscopy or flexible sigmoidoscopy) you may notice spotting of blood in your stool or on the toilet paper. If you underwent a bowel prep for your procedure, you may not have a normal bowel movement for a few days.  Please Note:  You might notice some irritation and congestion in your nose or some drainage.  This is from the oxygen used during your procedure.  There is no need for concern and it should clear up in a day or so.  SYMPTOMS TO REPORT IMMEDIATELY:     Following upper endoscopy (EGD)  Vomiting of blood or coffee ground material  New chest pain or pain under the shoulder blades  Painful or persistently difficult swallowing  New shortness of breath  Fever of 100F or higher  Black, tarry-looking stools  For urgent or emergent issues, a gastroenterologist can be reached at any hour by calling [phone].   DIET: Your first meal following the procedure should be a small meal and then it is ok to progress to your normal diet. Heavy or fried foods are harder to digest and may make you feel nauseous or bloated.  Likewise, meals heavy in dairy and vegetables can increase bloating.  Drink plenty of fluids but you should avoid alcoholic beverages  for 24 hours.  ACTIVITY:  You should plan to take it easy for the rest of today and you should NOT DRIVE or use heavy machinery until tomorrow (because of the sedation medicines used during the test).    FOLLOW UP: Our staff will call the number listed on your records the next business day following your procedure to check on you and address any questions or concerns that you may have regarding the information given to you following your procedure. If we do not reach you, we will leave a message.  However, if you are feeling well and you are not experiencing any problems, there is no need to return our call.  We will assume that you have returned to your regular daily activities without incident.  If any biopsies were taken you will be contacted by phone or by letter within the next 1-3 weeks.  Please call us at [phone] if you have not heard about the biopsies in 3 weeks.    SIGNATURES/CONFIDENTIALITY: You and/or your care partner have signed paperwork which will be entered into your electronic medical record.  These signatures attest to the fact that that the information above on your After Visit Summary has been reviewed and is understood.  Full responsibility of the confidentiality of this discharge information lies with you and/or your care-partner.    INFORMATION ON POLYPS GIVEN TO YOU TODAY

## 2014-10-03 NOTE — Op Note (Signed)
[facility]  [address] [place], [address]   COLONOSCOPY PROCEDURE REPORT  PATIENT: Caroline Ward, Caroline Ward  MR#: [id number] BIRTHDATE: [birth year] , 53  yrs. old GENDER: female ENDOSCOPIST: Milus Banister, MD REFERRED BH:[facility] [place], DO PROCEDURE DATE:  10/03/2014 PROCEDURE:   Colonoscopy, screening and Colonoscopy with snare polypectomy First Screening Colonoscopy - Avg.  risk and is 50 yrs.  old or older Yes.  Prior Negative Screening - Now for repeat screening. N/A  History of Adenoma - Now for follow-up colonoscopy & has been > or = to 3 yrs.  N/A  Polyps removed today? Yes ASA CLASS:   Class II INDICATIONS:Screening for colonic neoplasia and FH Colon Adenoma. MEDICATIONS: Monitored anesthesia care and Propofol 300 mg IV  DESCRIPTION OF PROCEDURE:   After the risks benefits and alternatives of the procedure were thoroughly explained, informed consent was obtained.  The digital rectal exam revealed no abnormalities of the rectum.   The LB [id number]  endoscope was introduced through the anus and advanced to the cecum, which was identified by both the appendix and ileocecal valve. No adverse events experienced.   The quality of the prep was excellent.  The instrument was then slowly withdrawn as the colon was fully examined. Estimated blood loss is zero unless otherwise noted in this procedure report.   COLON FINDINGS: A sessile polyp measuring 3 mm in size was found in the sigmoid colon.  A polypectomy was performed with a cold snare. The resection was complete, the polyp tissue was completely retrieved and sent to histology.   The examination was otherwise normal.  Retroflexed views revealed no abnormalities. The time to cecum = 1.8 Withdrawal time = 7.9   The scope was withdrawn and the procedure completed. COMPLICATIONS: There were no immediate complications.  ENDOSCOPIC IMPRESSION: 1.   Sessile polyp was found in the sigmoid colon; polypectomy  was performed with a cold snare 2.   The examination was otherwise normal  RECOMMENDATIONS: If the polyp(s) removed today are proven to be adenomatous (pre-cancerous) polyps, you will need a repeat colonoscopy in 5 years.  Otherwise you should continue to follow colorectal cancer screening guidelines for "routine risk" patients with colonoscopy in 10 years.  You will receive a letter within 1-2 weeks with the results of your biopsy as well as final recommendations.  Please call my office if you have not received a letter after 3 weeks.  eSigned:  Milus Banister, MD 10/03/2014 9:24 AM

## 2014-10-03 NOTE — Progress Notes (Signed)
Report to PACU, RN, vss, BBS= Clear.  

## 2014-10-04 ENCOUNTER — Telehealth: Payer: Self-pay | Admitting: *Deleted

## 2014-10-04 NOTE — Telephone Encounter (Signed)
  Follow up Call-  Call back number 10/03/2014  Post procedure Call Back phone  # [phone]  Permission to leave phone message Yes     Patient questions:  Do you have a fever, pain , or abdominal swelling? No. Pain Score  0 *  Have you tolerated food without any problems? Yes.    Have you been able to return to your normal activities? Yes.    Do you have any questions about your discharge instructions: Diet   No. Medications  No. Follow up visit  No.  Do you have questions or concerns about your Care? No.  Actions: * If pain score is 4 or above: No action needed, pain <4.

## 2014-10-09 ENCOUNTER — Encounter: Payer: Self-pay | Admitting: Gastroenterology

## 2014-10-12 ENCOUNTER — Telehealth: Payer: Self-pay | Admitting: Family Medicine

## 2014-10-12 NOTE — Telephone Encounter (Signed)
Pt called stating that pharmacy cannot process order for solifenacin (VESICARE) 10 MG tablet until they have PA and we have not sent to them. They said they faxed [place] a form. Pt phone # for PA is [phone]

## 2014-10-12 NOTE — Telephone Encounter (Signed)
I have not received form.

## 2014-10-12 NOTE — Telephone Encounter (Signed)
PA initiated. Awaiting determination. JG//CMA 

## 2014-10-13 NOTE — Telephone Encounter (Signed)
PA approved effective from 10/12/2014 through 01/12/2038.

## 2014-10-23 ENCOUNTER — Other Ambulatory Visit: Payer: Self-pay | Admitting: Family Medicine

## 2014-11-17 ENCOUNTER — Other Ambulatory Visit: Payer: Self-pay | Admitting: Family Medicine

## 2014-12-04 ENCOUNTER — Ambulatory Visit (INDEPENDENT_AMBULATORY_CARE_PROVIDER_SITE_OTHER): Payer: BLUE CROSS/BLUE SHIELD | Admitting: Family Medicine

## 2014-12-04 ENCOUNTER — Encounter: Payer: Self-pay | Admitting: Family Medicine

## 2014-12-04 VITALS — BP 114/74 | HR 74 | Temp 98.0°F | Wt 304.4 lb

## 2014-12-04 DIAGNOSIS — M7711 Lateral epicondylitis, right elbow: Secondary | ICD-10-CM

## 2014-12-04 DIAGNOSIS — K2100 Gastro-esophageal reflux disease with esophagitis, without bleeding: Secondary | ICD-10-CM

## 2014-12-04 DIAGNOSIS — M7710 Lateral epicondylitis, unspecified elbow: Secondary | ICD-10-CM | POA: Insufficient documentation

## 2014-12-04 MED ORDER — PREDNISONE 10 MG PO TABS: ORAL_TABLET | ORAL | Status: DC

## 2014-12-04 MED ORDER — PANTOPRAZOLE SODIUM 40 MG PO TBEC: DELAYED_RELEASE_TABLET | ORAL | Status: DC

## 2014-12-04 NOTE — Patient Instructions (Signed)
Tennis Elbow Tennis elbow (lateral epicondylitis) is inflammation of the outer tendons of your forearm close to your elbow. Your tendons attach your muscles to your bones. The outer tendons of your forearm are used to extend your wrist, and they attach on the outside part of your elbow. Tennis elbow is often found in people who play tennis, but anyone may get the condition from repeatedly extending the wrist or turning the forearm. CAUSES This condition is caused by repeatedly extending your wrist and using your hands. It can result from sports or work that requires repetitive forearm movements. Tennis elbow may also be caused by an injury. RISK FACTORS You have a higher risk of developing tennis elbow if you play tennis or another racquet sport. You also have a higher risk if you frequently use your hands for work. This condition is also more likely to develop in:  Musicians.  Carpenters, painters, and plumbers.  Cooks.  Cashiers.  People who work in factories.  Construction workers.  Butchers.  People who use computers. SYMPTOMS Symptoms of this condition include:  Pain and tenderness in your forearm and the outer part of your elbow. You may only feel the pain when you use your arm, or you may feel it even when you are not using your arm.  A burning feeling that runs from your elbow through your arm.  Weak grip in your hands. DIAGNOSIS  This condition may be diagnosed by medical history and physical exam. You may also have other tests, including:  X-rays.  MRI. TREATMENT Your health care provider will recommend lifestyle adjustments, such as resting and icing your arm. Treatment may also include:  Medicines for inflammation. This may include shots of cortisone if your pain continues.  Physical therapy. This may include massage or exercises.  An elbow brace. Surgery may eventually be recommended if your pain does not go away with treatment. HOME CARE  INSTRUCTIONS Activity  Rest your elbow and wrist as directed by your health care provider. Try to avoid any activities that caused the problem until your health care provider says that you can do them again.  If a physical therapist teaches you exercises, do all of them as directed.  If you lift an object, lift it with your palm facing upward. This lowers the stress on your elbow. Lifestyle  If your tennis elbow is caused by sports, check your equipment and make sure that:  You are using it correctly.  It is the best fit for you.  If your tennis elbow is caused by work, take breaks frequently, if you are able. Talk with your manager about how to best perform tasks in a way that is safe.  If your tennis elbow is caused by computer use, talk with your manager about any changes that can be made to your work environment. General Instructions  If directed, apply ice to the painful area:  Put ice in a plastic bag.  Place a towel between your skin and the bag.  Leave the ice on for 20 minutes, 2-3 times per day.  Take medicines only as directed by your health care provider.  If you were given a brace, wear it as directed by your health care provider.  Keep all follow-up visits as directed by your health care provider. This is important. SEEK MEDICAL CARE IF:  Your pain does not get better with treatment.  Your pain gets worse.  You have numbness or weakness in your forearm, hand, or fingers.     This information is not intended to replace advice given to you by your health care provider. Make sure you discuss any questions you have with your health care provider.   Document Released: 12/30/2004 Document Revised: 05/16/2014 Document Reviewed: 12/26/2013 Elsevier Interactive Patient Education 2016 Elsevier Inc.  

## 2014-12-04 NOTE — Assessment & Plan Note (Signed)
pred taper Ice  Tennis elbow strap Will refer to sport med if no better

## 2014-12-04 NOTE — Progress Notes (Signed)
Patient ID: Caroline Ward, female    DOB: [birth year]  Age: 54 y.o. MRN: [id number]    Subjective:  Subjective HPI Caroline Ward presents for R elbow pain x 2 weeks .  No known injury.  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  It bothers her when she is pulling up on something.    Review of Systems  Constitutional: Negative for diaphoresis, appetite change, fatigue and unexpected weight change.  Eyes: Negative for pain, redness and visual disturbance.  Respiratory: Negative for cough, chest tightness, shortness of breath and wheezing.   Cardiovascular: Negative for chest pain, palpitations and leg swelling.  Endocrine: Negative for cold intolerance, heat intolerance, polydipsia, polyphagia and polyuria.  Genitourinary: Negative for dysuria, frequency and difficulty urinating.  Musculoskeletal: Positive for arthralgias. Negative for myalgias.  Neurological: Negative for dizziness, light-headedness, numbness and headaches.    History Past Medical History  Diagnosis Date  . Depression   . Anxiety   . Migraine   . Heart murmur   . Asthma     on inhaler  . UTI (urinary tract infection)     in past  . Pneumonia     3 years ago/has bronchitis often  . GERD (gastroesophageal reflux disease)   . Hemorrhoids     in past    She has past surgical history that includes Wisdom tooth extraction.   Her family history includes Alcohol abuse in her father; Breast cancer in her maternal aunt; COPD in her mother; Cancer in her paternal uncle; Colon polyps in her mother and sister; Dementia in her maternal aunt; Hyperlipidemia in her mother; Hypertension in her mother; Liver disease in her maternal aunt; Lung cancer in her father; Other in her brother and sister; Pancreatic cancer in her brother; Throat cancer in her father.She reports that she has never smoked. She has never used smokeless tobacco. She reports that she does not  drink alcohol or use illicit drugs.  Current Outpatient Prescriptions on File Prior to Visit  Medication Sig Dispense Refill  . acetaminophen (TYLENOL) 500 MG tablet Take 500 mg by mouth as needed.    . ALPRAZolam (XANAX) 0.25 MG tablet TAKE 1 TABLET BY MOUTH ONCE DAILY AS NEEDED FOR SLEEP 30 tablet 0  . beclomethasone (QVAR) 80 MCG/ACT inhaler Inhale 1 puff into the lungs daily.    . Calcium-Magnesium-Vitamin D [id number] MG-MG-UNIT CHEW Chew 1 capsule by mouth daily.    . citalopram (CELEXA) 20 MG tablet TAKE 1 TABLET BY MOUTH EVERY DAY 30 tablet 5  . famotidine (PEPCID) 20 MG tablet One at bedtime 30 tablet 11  . fluticasone (FLONASE) 50 MCG/ACT nasal spray Place 2 sprays into both nostrils daily. 16 g 6  . Multiple Vitamin (MULTIVITAMIN) capsule Take 1 capsule by mouth daily.    . naproxen (NAPROSYN) 500 MG tablet TAKE 1 TABLET BY MOUTH TWICE DAILY WITH MEALS 180 tablet 0  . Omega-3 Fatty Acids (FISH OIL) 500 MG CAPS Take by mouth.    . VESICARE 10 MG tablet TAKE 1 TABLET BY MOUTH EVERY DAY 30 tablet 5   No current facility-administered medications on file prior to visit.     Objective:  Objective Physical Exam  Constitutional: She appears well-developed and well-nourished.  Musculoskeletal: She exhibits tenderness.       Arms:  BP 114/74 mmHg  Pulse 74  Temp(Src) 98 F (36.7 C) (Oral)  Wt 304 lb  6.4 oz (138.075 kg)  SpO2 97% Wt Readings from Last 3 Encounters:  12/04/14 304 lb 6.4 oz (138.075 kg)  10/03/14 305 lb (138.347 kg)  09/19/14 305 lb 3.2 oz (138.438 kg)     Lab Results  Component Value Date   WBC 11.7* 08/15/2014   HGB 13.2 08/15/2014   HCT 40.3 08/15/2014   PLT 323.0 08/15/2014   GLUCOSE 85 07/25/2014   CHOL 213* 07/25/2014   TRIG 132.0 07/25/2014   HDL 43.80 07/25/2014   LDLDIRECT 144.8 05/19/2011   LDLCALC 143* 07/25/2014   ALT 26 07/25/2014   AST 21 07/25/2014   NA 135 07/25/2014   K 4.2 07/25/2014   CL 100 07/25/2014   CREATININE 0.65  07/25/2014   BUN 10 07/25/2014   CO2 27 07/25/2014   TSH 1.93 07/25/2014    No results found.   Assessment & Plan:  Plan I have discontinued Caroline Ward's mirabegron ER. I am also having her start on predniSONE. Additionally, I am having her maintain her ALPRAZolam, Calcium-Magnesium-Vitamin D, famotidine, multivitamin, beclomethasone, fluticasone, Fish Oil, acetaminophen, naproxen, citalopram, VESICARE, and pantoprazole.  Meds ordered this encounter  Medications  . pantoprazole (PROTONIX) 40 MG tablet    Sig: TAKE 1 TABLET BY MOUTH EVERY DAILY 30 TO 60 MINUTES BEFORE FIRST MEAL OF THE DAY    Dispense:  90 tablet    Refill:  3    Please defer to PCP for future refills  . predniSONE (DELTASONE) 10 MG tablet    Sig: 3 po qd for 3 days then 2 po qd for 3 days the 1 po qd for 3 days    Dispense:  18 tablet    Refill:  0    Problem List Items Addressed This Visit    Epicondylitis, lateral    pred taper Ice  Tennis elbow strap Will refer to sport med if no better      Relevant Medications   predniSONE (DELTASONE) 10 MG tablet    Other Visit Diagnoses    Tennis elbow, right    -  Primary    Relevant Medications    predniSONE (DELTASONE) 10 MG tablet    Gastroesophageal reflux disease with esophagitis        Relevant Medications    pantoprazole (PROTONIX) 40 MG tablet       Follow-up: Return if symptoms worsen or fail to improve.  Garnet Koyanagi, DO

## 2014-12-04 NOTE — Progress Notes (Signed)
Pre visit review using our clinic review tool, if applicable. No additional management support is needed unless otherwise documented below in the visit note. 

## 2015-01-12 ENCOUNTER — Other Ambulatory Visit: Payer: Self-pay | Admitting: Family Medicine

## 2015-01-12 NOTE — Telephone Encounter (Signed)
REFILLED RX AND SENT TO PHARMACY

## 2015-03-14 ENCOUNTER — Other Ambulatory Visit: Payer: Self-pay | Admitting: Family Medicine

## 2015-03-15 ENCOUNTER — Other Ambulatory Visit: Payer: Self-pay

## 2015-03-15 DIAGNOSIS — Z1231 Encounter for screening mammogram for malignant neoplasm of breast: Secondary | ICD-10-CM

## 2015-03-21 ENCOUNTER — Encounter: Payer: Self-pay | Admitting: Family Medicine

## 2015-04-02 ENCOUNTER — Ambulatory Visit
Admission: RE | Admit: 2015-04-02 | Discharge: 2015-04-02 | Disposition: A | Discharge status: Home / Self Care | Payer: BLUE CROSS/BLUE SHIELD | Source: Ambulatory Visit

## 2015-04-02 DIAGNOSIS — Z1231 Encounter for screening mammogram for malignant neoplasm of breast: Secondary | ICD-10-CM

## 2015-04-19 DIAGNOSIS — Z6841 Body Mass Index (BMI) 40.0 and over, adult: Secondary | ICD-10-CM | POA: Diagnosis not present

## 2015-04-20 ENCOUNTER — Other Ambulatory Visit (HOSPITAL_COMMUNITY): Payer: Self-pay | Admitting: General Surgery

## 2015-05-02 ENCOUNTER — Other Ambulatory Visit: Payer: Self-pay

## 2015-05-02 ENCOUNTER — Ambulatory Visit (HOSPITAL_COMMUNITY)
Admission: RE | Admit: 2015-05-02 | Discharge: 2015-05-02 | Disposition: A | Discharge status: Home / Self Care | Payer: BLUE CROSS/BLUE SHIELD | Source: Ambulatory Visit | Attending: General Surgery | Admitting: General Surgery

## 2015-05-02 DIAGNOSIS — R932 Abnormal findings on diagnostic imaging of liver and biliary tract: Secondary | ICD-10-CM | POA: Diagnosis not present

## 2015-05-02 DIAGNOSIS — Z6841 Body Mass Index (BMI) 40.0 and over, adult: Secondary | ICD-10-CM | POA: Diagnosis not present

## 2015-05-02 DIAGNOSIS — R918 Other nonspecific abnormal finding of lung field: Secondary | ICD-10-CM | POA: Diagnosis not present

## 2015-05-02 DIAGNOSIS — Z01818 Encounter for other preprocedural examination: Secondary | ICD-10-CM | POA: Diagnosis not present

## 2015-05-14 ENCOUNTER — Other Ambulatory Visit: Payer: Self-pay | Admitting: Family Medicine

## 2015-05-17 ENCOUNTER — Encounter: Payer: Self-pay | Admitting: Family Medicine

## 2015-05-17 DIAGNOSIS — F509 Eating disorder, unspecified: Secondary | ICD-10-CM | POA: Diagnosis not present

## 2015-05-29 ENCOUNTER — Encounter: Payer: Self-pay | Admitting: Family Medicine

## 2015-05-29 NOTE — Telephone Encounter (Signed)
Please advise.//AB/CMA 

## 2015-06-07 ENCOUNTER — Telehealth: Payer: Self-pay | Admitting: *Deleted

## 2015-06-07 NOTE — Telephone Encounter (Signed)
Received via fax, filled out as much as possible and forwarded to Dr. Carollee Herter. JG//CMA

## 2015-06-12 ENCOUNTER — Encounter: Payer: BLUE CROSS/BLUE SHIELD | Attending: General Surgery | Admitting: Dietician

## 2015-06-12 ENCOUNTER — Encounter: Payer: Self-pay | Admitting: Dietician

## 2015-06-12 DIAGNOSIS — F509 Eating disorder, unspecified: Secondary | ICD-10-CM | POA: Diagnosis not present

## 2015-06-12 DIAGNOSIS — Z6841 Body Mass Index (BMI) 40.0 and over, adult: Secondary | ICD-10-CM | POA: Insufficient documentation

## 2015-06-12 NOTE — Progress Notes (Signed)
  Pre-Op Assessment Visit:  Pre-Operative RYGB Surgery  Medical Nutrition Therapy:  Appt start time: [id number]   End time:  1145  Patient was seen on 06/12/2015 for Pre-Operative Nutrition Assessment. Assessment and letter of approval faxed to [facility] Bariatric Surgery Program coordinator on 06/13/2015.   Preferred Learning Style:   No preference indicated   Learning Readiness:   Ready  Handouts given during visit include:  Pre-Op Goals Bariatric Surgery Protein Shakes   During the appointment today the following Pre-Op Goals were reviewed with the patient: Maintain or lose weight as instructed by your surgeon Make healthy food choices Begin to limit portion sizes Limited concentrated sugars and fried foods Keep fat/sugar in the single digits per serving on   food labels Practice CHEWING your food  (aim for 30 chews per bite or until applesauce consistency) Practice not drinking 15 minutes before, during, and 30 minutes after each meal/snack Avoid all carbonated beverages  Avoid/limit caffeinated beverages  Avoid all sugar-sweetened beverages Consume 3 meals per day; eat every 3-5 hours Make a list of non-food related activities Aim for 64-100 ounces of FLUID daily  Aim for at least 60-80 grams of PROTEIN daily Look for a liquid protein source that contain ?15 g protein and ?5 g carbohydrate  (ex: shakes, drinks, shots)  Patient-Centered Goals: -Walking upstairs more easily -Breathing easier, especially when active -Spending time with niece -Resolved foot pain  Demonstrated degree of understanding via:  Teach Back  Teaching Method Utilized:  Visual Auditory Hands on  Barriers to learning/adherence to lifestyle change: none  Patient to call the [facility] to enroll in Pre-Op and Post-Op Nutrition Education when surgery date is scheduled.

## 2015-06-12 NOTE — Patient Instructions (Signed)
Follow Pre-Op Goals Try Protein Shakes Call [facility] at [phone] when surgery is scheduled to enroll in Pre-Op Class  Things to remember:  Please always be honest with [place]. We want to support you!  If you have any questions or concerns in between appointments, please call or email Ferol Luz, or Margarita Grizzle.  The diet after surgery will be high protein and low in carbohydrate.  Vitamins and calcium need to be taken for the rest of your life.  Feel free to include support people in any classes or appointments.   Supplement recommendations:  "Complete" Multivitamin: Sleeve Gastrectomy and RYGB patients take a double dose of MVI. Vitamin must be liquid or chewable but not gummy. Examples of these include Flintstones Complete and Centrum Complete. If the vitamin is bariatric-specific, take 1 dose as it is already formulated for bariatric surgery patients. Examples of these are Bariatric Advantage, Celebrate, and [employer]. These can be found at the [facility] and/or online.     Calcium citrate: 1500 mg/day of Calcium citrate (also chewable or liquid) is recommended for all procedures. The body is only able to absorb 500-600 mg of Calcium at one time so 3 daily doses of 500 mg are recommended. Calcium doses must be taken a minimum of 2 hours apart. Additionally, Calcium must be taken 2 hours apart from iron-containing MVI. Examples of brands include Celebrate, Bariatric Advantage, and Wellesse. These brands must be purchased online or at the [facility]. Citracal Petites is the only Calcium citrate supplement found in general grocery stores and pharmacies. This is in tablet form and may be recommended for patients who do not tolerate chewable Calcium.  Continued or added Vitamin D supplementation based on individual needs.    Vitamin B12: 300-500 mcg/day for Sleeve Gastrectomy and RYGB. Must be taken intramuscularly, sublingually, or inhaled nasally. Oral route  is not recommended.

## 2015-06-18 ENCOUNTER — Encounter: Payer: Self-pay | Admitting: Family Medicine

## 2015-06-18 ENCOUNTER — Encounter: Payer: BLUE CROSS/BLUE SHIELD | Attending: General Surgery

## 2015-06-18 NOTE — Telephone Encounter (Signed)
Her paperwork is in the red folder.     KP

## 2015-06-18 NOTE — Progress Notes (Signed)
  Pre-Operative Nutrition Class:  Appt start time: 830   End time:  930.  Patient was seen on 06/18/2015 for Pre-Operative Bariatric Surgery Education at the [facility].   Surgery date:  Surgery type: RYGB Start weight at [facility]: 304 lbs on 06/12/2015 Weight today: 296.8 lbs  TANITA  BODY COMP RESULTS  06/18/15   BMI (kg/m^2) 46.5   Fat Mass (lbs) 167.6   Fat Free Mass (lbs) 129.2   Total Body Water (lbs) 96.4   Samples given per MNT protocol. Patient educated on appropriate usage: Premier protein shake (strawberry - qty 1) Lot #: [id number] Exp: 05/2016  Celebrate Vitamins Multivitamin (orange - qty 1) Lot #: [id number] Exp: 12/2015  Bariatric Advantage Calcium Citrate chew (caramel - qty 1) Lot #: [id number] Exp: 11/2015  Unjury Protein Powder (vanilla - qty 1) Lot #: [id number] Exp: 09/2015  The following the learning objectives were met by the patient during this course:  Identify Pre-Op Dietary Goals and will begin 2 weeks pre-operatively  Identify appropriate sources of fluids and proteins   State protein recommendations and appropriate sources pre and post-operatively  Identify Post-Operative Dietary Goals and will follow for 2 weeks post-operatively  Identify appropriate multivitamin and calcium sources  Describe the need for physical activity post-operatively and will follow MD recommendations  State when to call healthcare provider regarding medication questions or post-operative complications  Handouts given during class include:  Pre-Op Bariatric Surgery Diet Handout  Protein Shake Handout  Post-Op Bariatric Surgery Nutrition Handout  BELT Program Information Flyer  Support Group Information Flyer  [facility] Bariatric Supplements Price List  Follow-Up Plan: Patient will follow-up at [facility] 2 weeks post operatively for diet advancement per MD.

## 2015-06-19 ENCOUNTER — Encounter: Payer: Self-pay | Admitting: Family Medicine

## 2015-06-19 DIAGNOSIS — F509 Eating disorder, unspecified: Secondary | ICD-10-CM | POA: Diagnosis not present

## 2015-06-19 NOTE — Telephone Encounter (Signed)
Can be reached: [phone]   Reason for call: Pt called stating she does not have access to mychart today. If you have any further questions or information for her please call her so the deadline is not missed (deadline is tomorrow to get info to her other doctor, [facility]).

## 2015-06-26 ENCOUNTER — Encounter: Payer: Self-pay | Admitting: Family Medicine

## 2015-06-26 ENCOUNTER — Ambulatory Visit (INDEPENDENT_AMBULATORY_CARE_PROVIDER_SITE_OTHER): Payer: BLUE CROSS/BLUE SHIELD | Admitting: Family Medicine

## 2015-06-26 DIAGNOSIS — R32 Unspecified urinary incontinence: Secondary | ICD-10-CM | POA: Diagnosis not present

## 2015-06-26 LAB — POC URINALSYSI DIPSTICK (AUTOMATED)
Bilirubin, UA: NEGATIVE
Glucose, UA: NEGATIVE
Nitrite, UA: NEGATIVE
PH UA: 6
PROTEIN UA: NEGATIVE
Urobilinogen, UA: 2

## 2015-06-26 NOTE — Assessment & Plan Note (Signed)
Diet and exercise  Water aerobics Increase water intake F/u bariatric surgeon

## 2015-06-26 NOTE — Progress Notes (Signed)
Pre visit review using our clinic review tool, if applicable. No additional management support is needed unless otherwise documented below in the visit note. 

## 2015-06-26 NOTE — Progress Notes (Signed)
Patient ID: Caroline Ward, female    DOB: 1960-01-27  Age: 55 y.o. MRN: [id number]    Subjective:  Subjective HPI Caroline Ward presents for f/u weight ----  She is walking and doing water aerobics She is waiting for gastric bypass surgery.  Review of Systems  Constitutional: Negative for diaphoresis, appetite change, fatigue and unexpected weight change.  Eyes: Negative for pain, redness and visual disturbance.  Respiratory: Negative for cough, chest tightness, shortness of breath and wheezing.   Cardiovascular: Negative for chest pain, palpitations and leg swelling.  Endocrine: Negative for cold intolerance, heat intolerance, polydipsia, polyphagia and polyuria.  Genitourinary: Negative for dysuria, frequency and difficulty urinating.  Neurological: Negative for dizziness, light-headedness, numbness and headaches.    History Past Medical History  Diagnosis Date  . Depression   . Anxiety   . Migraine   . Heart murmur   . Asthma     on inhaler  . UTI (urinary tract infection)     in past  . Pneumonia     3 years ago/has bronchitis often  . GERD (gastroesophageal reflux disease)   . Hemorrhoids     in past    She has past surgical history that includes Wisdom tooth extraction.   Her family history includes Alcohol abuse in her father; Breast cancer in her maternal aunt; COPD in her mother; Cancer in her paternal uncle; Colon polyps in her mother and sister; Dementia in her maternal aunt; Hyperlipidemia in her mother; Hypertension in her mother; Liver disease in her maternal aunt; Lung cancer in her father; Other in her brother and sister; Pancreatic cancer in her brother; Throat cancer in her father.She reports that she has never smoked. She has never used smokeless tobacco. She reports that she does not drink alcohol or use illicit drugs.  Current Outpatient Prescriptions on File Prior to Visit  Medication Sig Dispense Refill  . acetaminophen (TYLENOL) 500 MG tablet Take  500 mg by mouth as needed.    . ALPRAZolam (XANAX) 0.25 MG tablet TAKE 1 TABLET BY MOUTH ONCE DAILY AS NEEDED FOR SLEEP 30 tablet 0  . beclomethasone (QVAR) 80 MCG/ACT inhaler Inhale 1 puff into the lungs daily.    . Calcium-Magnesium-Vitamin D [id number] MG-MG-UNIT CHEW Chew 1 capsule by mouth daily.    . citalopram (CELEXA) 20 MG tablet TAKE 1 TABLET BY MOUTH EVERY DAY 30 tablet 5  . citalopram (CELEXA) 20 MG tablet TAKE 1 TABLET BY MOUTH EVERY DAY 30 tablet 2  . Echinacea 125 MG CAPS Take by mouth.    . famotidine (PEPCID) 20 MG tablet One at bedtime 30 tablet 11  . fluticasone (FLONASE) 50 MCG/ACT nasal spray Place 2 sprays into both nostrils daily. 16 g 6  . Multiple Vitamin (MULTIVITAMIN) capsule Take 1 capsule by mouth daily.    . naproxen (NAPROSYN) 500 MG tablet TAKE 1 TABLET BY MOUTH TWICE DAILY WITH MEALS 180 tablet 0  . Omega-3 Fatty Acids (FISH OIL) 500 MG CAPS Take by mouth.    . pantoprazole (PROTONIX) 40 MG tablet TAKE 1 TABLET BY MOUTH EVERY DAILY 30 TO 60 MINUTES BEFORE FIRST MEAL OF THE DAY 90 tablet 3  . VESICARE 10 MG tablet TAKE 1 TABLET BY MOUTH EVERY DAY 30 tablet 5   No current facility-administered medications on file prior to visit.     Objective:  Objective Physical Exam  Constitutional: She is oriented to person, place, and time. She appears well-developed and well-nourished.  HENT:  Head:  Normocephalic and atraumatic.  Eyes: Conjunctivae and EOM are normal.  Neck: Normal range of motion. Neck supple. No JVD present. Carotid bruit is not present. No thyromegaly present.  Cardiovascular: Normal rate, regular rhythm and normal heart sounds.   No murmur heard. Pulmonary/Chest: Effort normal and breath sounds normal. No respiratory distress. She has no wheezes. She has no rales. She exhibits no tenderness.  Musculoskeletal: She exhibits no edema.  Neurological: She is alert and oriented to person, place, and time.  Psychiatric: She has a normal mood and affect.   Nursing note and vitals reviewed.  BP 114/78 mmHg  Pulse 78  Temp(Src) 98.2 F (36.8 C) (Oral)  Wt 294 lb (133.358 kg)  SpO2 95% Wt Readings from Last 3 Encounters:  06/26/15 294 lb (133.358 kg)  06/18/15 296 lb 12.8 oz (134.628 kg)  06/12/15 303 lb 11.2 oz (137.757 kg)     Lab Results  Component Value Date   WBC 11.7* 08/15/2014   HGB 13.2 08/15/2014   HCT 40.3 08/15/2014   PLT 323.0 08/15/2014   GLUCOSE 85 07/25/2014   CHOL 213* 07/25/2014   TRIG 132.0 07/25/2014   HDL 43.80 07/25/2014   LDLDIRECT 144.8 05/19/2011   LDLCALC 143* 07/25/2014   ALT 26 07/25/2014   AST 21 07/25/2014   NA 135 07/25/2014   K 4.2 07/25/2014   CL 100 07/25/2014   CREATININE 0.65 07/25/2014   BUN 10 07/25/2014   CO2 27 07/25/2014   TSH 1.93 07/25/2014    Dg Chest 2 View  05/02/2015  CLINICAL DATA:  Preoperative evaluation for bariatric surgery; morbid obesity EXAM: CHEST  2 VIEW COMPARISON:  March 31, 2014 FINDINGS: There is slight atelectatic change in the left mid lung and left base regions. Lungs elsewhere clear. Heart size and pulmonary vascularity are within normal limits. No adenopathy. No bone lesions. IMPRESSION: Mild left mid lung and left base atelectatic change. No edema or consolidation. Stable cardiac silhouette. Electronically Signed   By: Lowella Grip III M.D.   On: 05/02/2015 10:45   Dg Duanne Limerick  W/kub  05/02/2015  CLINICAL DATA:  Preop gastric bypass surgery. EXAM: UPPER GI SERIES WITH KUB TECHNIQUE: After obtaining a scout radiograph a routine upper GI series was performed using thin barium FLUOROSCOPY TIME:  Radiation Exposure Index (as provided by the fluoroscopic device): 28.6 mGy If the device does not provide the exposure index: Fluoroscopy Time (in minutes and seconds):  1 minutes Number of Acquired Images: COMPARISON:  None. FINDINGS: The scout radiograph is unremarkable. The esophagus appears patent. No stricture or mass. Normal anatomy of the stomach, duodenal bulb and  duodenal Celsius loop. No hiatal hernia or reflux identified. IMPRESSION: 1. Normal upper GI. Electronically Signed   By: Kerby Moors M.D.   On: 05/02/2015 11:46   US Abdomen Limited Ruq  05/02/2015  CLINICAL DATA:  Morbid obesity. EXAM: US ABDOMEN LIMITED - RIGHT UPPER QUADRANT COMPARISON:  None. FINDINGS: Gallbladder: No gallstones or wall thickening visualized. No sonographic Murphy sign noted by sonographer. Common bile duct: Diameter: 4.1 mm which is within normal limits. Liver: No focal lesion identified. Increased echogenicity is noted suggesting fatty infiltration. IMPRESSION: Probable fatty infiltration of the liver. No other abnormality seen in the right upper quadrant of the abdomen. Electronically Signed   By: Marijo Conception, M.D.   On: 05/02/2015 09:56     Assessment & Plan:  Plan I have discontinued Ms. Riss's predniSONE. I am also having her maintain her ALPRAZolam, Calcium-Magnesium-Vitamin D,  famotidine, multivitamin, beclomethasone, fluticasone, Fish Oil, acetaminophen, citalopram, VESICARE, pantoprazole, naproxen, citalopram, and Echinacea.  No orders of the defined types were placed in this encounter.    Problem List Items Addressed This Visit    Morbid obesity ([place])    Diet and exercise  Water aerobics Increase water intake F/u bariatric surgeon      Severe obesity (BMI >= 40) ([place]) - Primary    Other Visit Diagnoses    Urinary incontinence, unspecified incontinence type        Relevant Orders    POCT Urinalysis Dipstick (Automated) (Completed)    Urine Culture       Follow-up: Return in about 4 weeks (around 07/24/2015), or if symptoms worsen or fail to improve.  Ann Held, DO

## 2015-06-27 DIAGNOSIS — R32 Unspecified urinary incontinence: Secondary | ICD-10-CM | POA: Diagnosis not present

## 2015-06-28 ENCOUNTER — Other Ambulatory Visit: Payer: Self-pay | Admitting: Family Medicine

## 2015-06-28 LAB — URINE CULTURE
Colony Count: NO GROWTH
Organism ID, Bacteria: NO GROWTH

## 2015-07-04 DIAGNOSIS — F509 Eating disorder, unspecified: Secondary | ICD-10-CM | POA: Diagnosis not present

## 2015-07-12 DIAGNOSIS — F509 Eating disorder, unspecified: Secondary | ICD-10-CM | POA: Diagnosis not present

## 2015-07-24 ENCOUNTER — Encounter: Payer: Self-pay | Admitting: Family Medicine

## 2015-07-24 ENCOUNTER — Ambulatory Visit (INDEPENDENT_AMBULATORY_CARE_PROVIDER_SITE_OTHER): Payer: BLUE CROSS/BLUE SHIELD | Admitting: Family Medicine

## 2015-07-24 VITALS — BP 118/80 | HR 96 | Temp 98.8°F | Ht 67.0 in | Wt 285.2 lb

## 2015-07-24 DIAGNOSIS — L309 Dermatitis, unspecified: Secondary | ICD-10-CM | POA: Diagnosis not present

## 2015-07-24 MED ORDER — ERYTHROMYCIN 2 % EX OINT: TOPICAL_OINTMENT | CUTANEOUS | Status: DC

## 2015-07-24 NOTE — Progress Notes (Signed)
Pre visit review using our clinic review tool, if applicable. No additional management support is needed unless otherwise documented below in the visit note. 

## 2015-07-24 NOTE — Progress Notes (Signed)
Patient ID: Caroline Ward, female    DOB: [birth year]  Age: 55 y.o. MRN: [id number]    Subjective:  Subjective HPI Caroline Ward presents for f/u obesity---  She is exercising and is doing well with the diet--- she walks in the evenings.  .  She has lost 3 lbs.  She is looking forward to gastric bypass.    Review of Systems  Constitutional: Negative for diaphoresis, appetite change, fatigue and unexpected weight change.  Eyes: Negative for pain, redness and visual disturbance.  Respiratory: Negative for cough, chest tightness, shortness of breath and wheezing.   Cardiovascular: Negative for chest pain, palpitations and leg swelling.  Endocrine: Negative for cold intolerance, heat intolerance, polydipsia, polyphagia and polyuria.  Genitourinary: Negative for dysuria, frequency and difficulty urinating.  Neurological: Negative for dizziness, light-headedness, numbness and headaches.    History Past Medical History  Diagnosis Date  . Depression   . Anxiety   . Migraine   . Heart murmur   . Asthma     on inhaler  . UTI (urinary tract infection)     in past  . Pneumonia     3 years ago/has bronchitis often  . GERD (gastroesophageal reflux disease)   . Hemorrhoids     in past    She has past surgical history that includes Wisdom tooth extraction.   Her family history includes Alcohol abuse in her father; Breast cancer in her maternal aunt; COPD in her mother; Cancer in her paternal uncle; Colon polyps in her mother and sister; Dementia in her maternal aunt; Hyperlipidemia in her mother; Hypertension in her mother; Liver disease in her maternal aunt; Lung cancer in her father; Other in her brother and sister; Pancreatic cancer in her brother; Throat cancer in her father.She reports that she has never smoked. She has never used smokeless tobacco. She reports that she does not drink alcohol or use illicit drugs.  Current Outpatient Prescriptions on File Prior to Visit  Medication Sig  Dispense Refill  . acetaminophen (TYLENOL) 500 MG tablet Take 500 mg by mouth as needed.    . ALPRAZolam (XANAX) 0.25 MG tablet TAKE 1 TABLET BY MOUTH ONCE DAILY AS NEEDED FOR SLEEP 30 tablet 0  . beclomethasone (QVAR) 80 MCG/ACT inhaler Inhale 1 puff into the lungs daily.    . Calcium-Magnesium-Vitamin D [id number] MG-MG-UNIT CHEW Chew 1 capsule by mouth daily.    . citalopram (CELEXA) 20 MG tablet TAKE 1 TABLET BY MOUTH EVERY DAY 30 tablet 2  . Echinacea 125 MG CAPS Take by mouth.    . famotidine (PEPCID) 20 MG tablet One at bedtime 30 tablet 11  . fluticasone (FLONASE) 50 MCG/ACT nasal spray Place 2 sprays into both nostrils daily. 16 g 6  . Multiple Vitamin (MULTIVITAMIN) capsule Take 1 capsule by mouth daily.    . naproxen (NAPROSYN) 500 MG tablet TAKE 1 TABLET BY MOUTH TWICE DAILY WITH MEALS 180 tablet 0  . Omega-3 Fatty Acids (FISH OIL) 500 MG CAPS Take by mouth.    . pantoprazole (PROTONIX) 40 MG tablet TAKE 1 TABLET BY MOUTH EVERY DAILY 30 TO 60 MINUTES BEFORE FIRST MEAL OF THE DAY 90 tablet 3  . VESICARE 10 MG tablet TAKE 1 TABLET BY MOUTH EVERY DAY 30 tablet 5   No current facility-administered medications on file prior to visit.     Objective:  Objective Physical Exam  Constitutional: She is oriented to person, place, and time. She appears well-developed and well-nourished.  HENT:  Head: Normocephalic and atraumatic.  Eyes: Conjunctivae and EOM are normal.  Neck: Normal range of motion. Neck supple. No JVD present. Carotid bruit is not present. No thyromegaly present.  Cardiovascular: Normal rate, regular rhythm and normal heart sounds.   No murmur heard. Pulmonary/Chest: Effort normal and breath sounds normal. No respiratory distress. She has no wheezes. She has no rales. She exhibits no tenderness.  Musculoskeletal: She exhibits no edema.  Neurological: She is alert and oriented to person, place, and time.  Psychiatric: She has a normal mood and affect. Her behavior is  normal. Judgment and thought content normal.  Nursing note and vitals reviewed.  BP 118/80 mmHg  Pulse 96  Temp(Src) 98.8 F (37.1 C) (Oral)  Ht 5\' 7"  (1.702 m)  Wt 285 lb 3.2 oz (129.366 kg)  BMI 44.66 kg/m2  SpO2 97% Wt Readings from Last 3 Encounters:  07/24/15 285 lb 3.2 oz (129.366 kg)  06/26/15 294 lb (133.358 kg)  06/18/15 296 lb 12.8 oz (134.628 kg)     Lab Results  Component Value Date   WBC 11.7* 08/15/2014   HGB 13.2 08/15/2014   HCT 40.3 08/15/2014   PLT 323.0 08/15/2014   GLUCOSE 85 07/25/2014   CHOL 213* 07/25/2014   TRIG 132.0 07/25/2014   HDL 43.80 07/25/2014   LDLDIRECT 144.8 05/19/2011   LDLCALC 143* 07/25/2014   ALT 26 07/25/2014   AST 21 07/25/2014   NA 135 07/25/2014   K 4.2 07/25/2014   CL 100 07/25/2014   CREATININE 0.65 07/25/2014   BUN 10 07/25/2014   CO2 27 07/25/2014   TSH 1.93 07/25/2014    Dg Chest 2 View  05/02/2015  CLINICAL DATA:  Preoperative evaluation for bariatric surgery; morbid obesity EXAM: CHEST  2 VIEW COMPARISON:  March 31, 2014 FINDINGS: There is slight atelectatic change in the left mid lung and left base regions. Lungs elsewhere clear. Heart size and pulmonary vascularity are within normal limits. No adenopathy. No bone lesions. IMPRESSION: Mild left mid lung and left base atelectatic change. No edema or consolidation. Stable cardiac silhouette. Electronically Signed   By: Lowella Grip III M.D.   On: 05/02/2015 10:45   Dg Duanne Limerick  W/kub  05/02/2015  CLINICAL DATA:  Preop gastric bypass surgery. EXAM: UPPER GI SERIES WITH KUB TECHNIQUE: After obtaining a scout radiograph a routine upper GI series was performed using thin barium FLUOROSCOPY TIME:  Radiation Exposure Index (as provided by the fluoroscopic device): 28.6 mGy If the device does not provide the exposure index: Fluoroscopy Time (in minutes and seconds):  1 minutes Number of Acquired Images: COMPARISON:  None. FINDINGS: The scout radiograph is unremarkable. The  esophagus appears patent. No stricture or mass. Normal anatomy of the stomach, duodenal bulb and duodenal Celsius loop. No hiatal hernia or reflux identified. IMPRESSION: 1. Normal upper GI. Electronically Signed   By: Kerby Moors M.D.   On: 05/02/2015 11:46   US Abdomen Limited Ruq  05/02/2015  CLINICAL DATA:  Morbid obesity. EXAM: US ABDOMEN LIMITED - RIGHT UPPER QUADRANT COMPARISON:  None. FINDINGS: Gallbladder: No gallstones or wall thickening visualized. No sonographic Murphy sign noted by sonographer. Common bile duct: Diameter: 4.1 mm which is within normal limits. Liver: No focal lesion identified. Increased echogenicity is noted suggesting fatty infiltration. IMPRESSION: Probable fatty infiltration of the liver. No other abnormality seen in the right upper quadrant of the abdomen. Electronically Signed   By: Marijo Conception, M.D.   On: 05/02/2015 09:56  Assessment & Plan:  Plan I am having Ms. Rebuck start on Erythromycin. I am also having her maintain her ALPRAZolam, Calcium-Magnesium-Vitamin D, famotidine, multivitamin, beclomethasone, fluticasone, Fish Oil, acetaminophen, pantoprazole, naproxen, citalopram, Echinacea, VESICARE, vitamin C, and Biotin.  Meds ordered this encounter  Medications  . vitamin C (ASCORBIC ACID) 250 MG tablet    Sig: Take 250 mg by mouth daily.  . Biotin 1000 MCG tablet    Sig: Take 1,000 mcg by mouth 3 (three) times daily.  . Erythromycin 2 % ointment    Sig: Apply to affected area bid x 2 weeks    Dispense:  25 g    Refill:  0    Problem List Items Addressed This Visit    None    Visit Diagnoses    Dermatitis    -  Primary    Relevant Medications    Erythromycin 2 % ointment    Morbid obesity due to excess calories ([place])           Follow-up: Return in about 4 weeks (around 08/21/2015), or if symptoms worsen or fail to improve.  Ann Held, DO

## 2015-07-24 NOTE — Patient Instructions (Signed)
Obesity Obesity is defined as having too much total body fat and a body mass index (BMI) of 30 or more. BMI is an estimate of body fat and is calculated from your height and weight. BMI is typically calculated by your health care provider during regular wellness visits. Obesity happens when you consume more calories than you can burn by exercising or performing daily physical tasks. Prolonged obesity can cause major illnesses or emergencies, such as:  Stroke.  Heart disease.  Diabetes.  Cancer.  Arthritis.  High blood pressure (hypertension).  High cholesterol.  Sleep apnea.  Erectile dysfunction.  Infertility problems. CAUSES   Regularly eating unhealthy foods.  Physical inactivity.  Certain disorders, such as an underactive thyroid (hypothyroidism), Cushing's syndrome, and polycystic ovarian syndrome.  Certain medicines, such as steroids, some depression medicines, and antipsychotics.  Genetics.  Lack of sleep. DIAGNOSIS A health care provider can diagnose obesity after calculating your BMI. Obesity will be diagnosed if your BMI is 30 or higher. There are other methods of measuring obesity levels. Some other methods include measuring your skinfold thickness, your waist circumference, and comparing your hip circumference to your waist circumference. TREATMENT  A healthy treatment program includes some or all of the following:  Long-term dietary changes.  Exercise and physical activity.  Behavioral and lifestyle changes.  Medicine only under the supervision of your health care provider. Medicines may help, but only if they are used with diet and exercise programs. If your BMI is 40 or higher, your health care provider may recommend specialized surgery or programs to help with weight loss. An unhealthy treatment program includes:  Fasting.  Fad diets.  Supplements and drugs. These choices do not succeed in long-term weight control. HOME CARE  INSTRUCTIONS  Exercise and perform physical activity as directed by your health care provider. To increase physical activity, try the following:  Use stairs instead of elevators.  Park farther away from store entrances.  Garden, bike, or walk instead of watching television or using the computer.  Eat healthy, low-calorie foods and drinks on a regular basis. Eat more fruits and vegetables. Use low-calorie cookbooks or take healthy cooking classes.  Limit fast food, sweets, and processed snack foods.  Eat smaller portions.  Keep a daily journal of everything you eat. There are many free websites to help you with this. It may be helpful to measure your foods so you can determine if you are eating the correct portion sizes.  Avoid drinking alcohol. Drink more water and drinks without calories.  Take vitamins and supplements only as recommended by your health care provider.  Weight-loss support groups, registered dietitians, counselors, and stress reduction education can also be very helpful. SEEK IMMEDIATE MEDICAL CARE IF:  You have chest pain or tightness.  You have trouble breathing or feel short of breath.  You have weakness or leg numbness.  You feel confused or have trouble talking.  You have sudden changes in your vision.   This information is not intended to replace advice given to you by your health care provider. Make sure you discuss any questions you have with your health care provider.   Document Released: 02/07/2004 Document Revised: 01/20/2014 Document Reviewed: 02/05/2011 Elsevier Interactive Patient Education 2016 [employer]  

## 2015-07-26 DIAGNOSIS — F509 Eating disorder, unspecified: Secondary | ICD-10-CM | POA: Diagnosis not present

## 2015-08-07 ENCOUNTER — Telehealth: Payer: Self-pay | Admitting: Family Medicine

## 2015-08-07 NOTE — Telephone Encounter (Signed)
Attempted to call patient to reschedule appointment with Dr. Carollee Herter scheduled for 8/4. Left message for patient to call office to reschedule. Appointment cancelled

## 2015-08-09 DIAGNOSIS — F509 Eating disorder, unspecified: Secondary | ICD-10-CM | POA: Diagnosis not present

## 2015-08-14 ENCOUNTER — Ambulatory Visit: Payer: BLUE CROSS/BLUE SHIELD | Admitting: Family Medicine

## 2015-08-14 ENCOUNTER — Other Ambulatory Visit: Payer: Self-pay | Admitting: Family Medicine

## 2015-08-16 ENCOUNTER — Encounter: Payer: Self-pay | Admitting: Family Medicine

## 2015-08-16 ENCOUNTER — Ambulatory Visit (INDEPENDENT_AMBULATORY_CARE_PROVIDER_SITE_OTHER): Payer: BLUE CROSS/BLUE SHIELD | Admitting: Family Medicine

## 2015-08-16 DIAGNOSIS — E785 Hyperlipidemia, unspecified: Secondary | ICD-10-CM | POA: Diagnosis not present

## 2015-08-16 NOTE — Patient Instructions (Signed)

## 2015-08-16 NOTE — Assessment & Plan Note (Signed)
con't diet and exercise F/u bariatric surgery rto 1 month

## 2015-08-16 NOTE — Progress Notes (Signed)
Patient ID: Caroline Ward, female    DOB: 1960-12-14  Age: 55 y.o. MRN: [id number]    Subjective:  Subjective  HPI [name] presents for f/u weight.   Pt is exrcising-- walking and watching her diet.     Review of Systems  Constitutional: Negative for appetite change, diaphoresis, fatigue and unexpected weight change.  Eyes: Negative for pain, redness and visual disturbance.  Respiratory: Negative for cough, chest tightness, shortness of breath and wheezing.   Cardiovascular: Negative for chest pain, palpitations and leg swelling.  Endocrine: Negative for cold intolerance, heat intolerance, polydipsia, polyphagia and polyuria.  Genitourinary: Negative for difficulty urinating, dysuria and frequency.  Neurological: Negative for dizziness, light-headedness, numbness and headaches.    History Past Medical History:  Diagnosis Date  . Anxiety   . Asthma    on inhaler  . Depression   . GERD (gastroesophageal reflux disease)   . Heart murmur   . Hemorrhoids    in past  . Migraine   . Pneumonia    3 years ago/has bronchitis often  . UTI (urinary tract infection)    in past    She has a past surgical history that includes Wisdom tooth extraction.   Her family history includes Alcohol abuse in her father; Breast cancer in her maternal aunt; COPD in her mother; Cancer in her paternal uncle; Colon polyps in her mother and sister; Dementia in her maternal aunt; Hyperlipidemia in her mother; Hypertension in her mother; Liver disease in her maternal aunt; Lung cancer in her father; Other in her brother and sister; Pancreatic cancer in her brother; Throat cancer in her father.She reports that she has never smoked. She has never used smokeless tobacco. She reports that she does not drink alcohol or use drugs.  Current Outpatient Prescriptions on File Prior to Visit  Medication Sig Dispense Refill  . acetaminophen (TYLENOL) 500 MG tablet Take 500 mg by mouth as needed.    . ALPRAZolam  (XANAX) 0.25 MG tablet TAKE 1 TABLET BY MOUTH ONCE DAILY AS NEEDED FOR SLEEP 30 tablet 0  . beclomethasone (QVAR) 80 MCG/ACT inhaler Inhale 1 puff into the lungs daily.    . Biotin 1000 MCG tablet Take 1,000 mcg by mouth 3 (three) times daily.    . Calcium-Magnesium-Vitamin D [id number] MG-MG-UNIT CHEW Chew 1 capsule by mouth daily.    . citalopram (CELEXA) 20 MG tablet TAKE 1 TABLET BY MOUTH EVERY DAY 30 tablet 1  . Echinacea 125 MG CAPS Take by mouth.    . Erythromycin 2 % ointment Apply to affected area bid x 2 weeks 25 g 0  . famotidine (PEPCID) 20 MG tablet One at bedtime 30 tablet 11  . fluticasone (FLONASE) 50 MCG/ACT nasal spray Place 2 sprays into both nostrils daily. 16 g 6  . Multiple Vitamin (MULTIVITAMIN) capsule Take 1 capsule by mouth daily.    . naproxen (NAPROSYN) 500 MG tablet TAKE 1 TABLET BY MOUTH TWICE DAILY WITH MEALS 180 tablet 0  . Omega-3 Fatty Acids (FISH OIL) 500 MG CAPS Take by mouth.    . pantoprazole (PROTONIX) 40 MG tablet TAKE 1 TABLET BY MOUTH EVERY DAILY 30 TO 60 MINUTES BEFORE FIRST MEAL OF THE DAY 90 tablet 3  . VESICARE 10 MG tablet TAKE 1 TABLET BY MOUTH EVERY DAY 30 tablet 5  . vitamin C (ASCORBIC ACID) 250 MG tablet Take 250 mg by mouth daily.     No current facility-administered medications on file prior to visit.  Objective:  Objective  Physical Exam  Constitutional: She is oriented to person, place, and time. She appears well-developed and well-nourished.  HENT:  Head: Normocephalic and atraumatic.  Eyes: Conjunctivae and EOM are normal.  Neck: Normal range of motion. Neck supple. No JVD present. Carotid bruit is not present. No thyromegaly present.  Cardiovascular: Normal rate, regular rhythm and normal heart sounds.   No murmur heard. Pulmonary/Chest: Effort normal and breath sounds normal. No respiratory distress. She has no wheezes. She has no rales. She exhibits no tenderness.  Musculoskeletal: She exhibits no edema.  Neurological:  She is alert and oriented to person, place, and time.  Psychiatric: She has a normal mood and affect.  Nursing note and vitals reviewed.  BP 116/74 (BP Location: Left Arm, Patient Position: Sitting, Cuff Size: Normal)   Pulse 86   Temp 98.3 F (36.8 C) (Oral)   Ht 5\' 7"  (1.702 m)   Wt 282 lb 9.6 oz (128.2 kg)   SpO2 97%   BMI 44.26 kg/m  Wt Readings from Last 3 Encounters:  08/16/15 282 lb 9.6 oz (128.2 kg)  07/24/15 285 lb 3.2 oz (129.4 kg)  06/26/15 294 lb (133.4 kg)     Lab Results  Component Value Date   WBC 11.7 (H) 08/15/2014   HGB 13.2 08/15/2014   HCT 40.3 08/15/2014   PLT 323.0 08/15/2014   GLUCOSE 85 07/25/2014   CHOL 213 (H) 07/25/2014   TRIG 132.0 07/25/2014   HDL 43.80 07/25/2014   LDLDIRECT 144.8 05/19/2011   LDLCALC 143 (H) 07/25/2014   ALT 26 07/25/2014   AST 21 07/25/2014   NA 135 07/25/2014   K 4.2 07/25/2014   CL 100 07/25/2014   CREATININE 0.65 07/25/2014   BUN 10 07/25/2014   CO2 27 07/25/2014   TSH 1.93 07/25/2014    Dg Chest 2 View  Result Date: 05/02/2015 CLINICAL DATA:  Preoperative evaluation for bariatric surgery; morbid obesity EXAM: CHEST  2 VIEW COMPARISON:  March 31, 2014 FINDINGS: There is slight atelectatic change in the left mid lung and left base regions. Lungs elsewhere clear. Heart size and pulmonary vascularity are within normal limits. No adenopathy. No bone lesions. IMPRESSION: Mild left mid lung and left base atelectatic change. No edema or consolidation. Stable cardiac silhouette. Electronically Signed   By: Lowella Grip III M.D.   On: 05/02/2015 10:45   Dg Ugi  W/kub  Result Date: 05/02/2015 CLINICAL DATA:  Preop gastric bypass surgery. EXAM: UPPER GI SERIES WITH KUB TECHNIQUE: After obtaining a scout radiograph a routine upper GI series was performed using thin barium FLUOROSCOPY TIME:  Radiation Exposure Index (as provided by the fluoroscopic device): 28.6 mGy If the device does not provide the exposure index:  Fluoroscopy Time (in minutes and seconds):  1 minutes Number of Acquired Images: COMPARISON:  None. FINDINGS: The scout radiograph is unremarkable. The esophagus appears patent. No stricture or mass. Normal anatomy of the stomach, duodenal bulb and duodenal Celsius loop. No hiatal hernia or reflux identified. IMPRESSION: 1. Normal upper GI. Electronically Signed   By: Kerby Moors M.D.   On: 05/02/2015 11:46   US Abdomen Limited Ruq  Result Date: 05/02/2015 CLINICAL DATA:  Morbid obesity. EXAM: US ABDOMEN LIMITED - RIGHT UPPER QUADRANT COMPARISON:  None. FINDINGS: Gallbladder: No gallstones or wall thickening visualized. No sonographic Murphy sign noted by sonographer. Common bile duct: Diameter: 4.1 mm which is within normal limits. Liver: No focal lesion identified. Increased echogenicity is noted suggesting fatty infiltration.  IMPRESSION: Probable fatty infiltration of the liver. No other abnormality seen in the right upper quadrant of the abdomen. Electronically Signed   By: Marijo Conception, M.D.   On: 05/02/2015 09:56     Assessment & Plan:  Plan  I am having Ms. Lovena Le maintain her ALPRAZolam, Calcium-Magnesium-Vitamin D, famotidine, multivitamin, beclomethasone, fluticasone, Fish Oil, acetaminophen, pantoprazole, naproxen, Echinacea, VESICARE, vitamin C, Biotin, Erythromycin, and citalopram.  No orders of the defined types were placed in this encounter.   Problem List Items Addressed This Visit      Unprioritized   Severe obesity (BMI >= 40) ([place]) - Primary    Other Visit Diagnoses    Hyperlipidemia       Relevant Orders   Comprehensive metabolic panel   Lipid panel    1. Morbid obesity due to excess calories ([place]) con't diet and exerise rto 1 month F/u bariatric surgeon   Follow-up: Return in about 4 weeks (around 09/13/2015), or weight check.  Ann Held, DO

## 2015-08-16 NOTE — Progress Notes (Signed)
Pre visit review using our clinic review tool, if applicable. No additional management support is needed unless otherwise documented below in the visit note. 

## 2015-08-17 ENCOUNTER — Ambulatory Visit: Payer: BLUE CROSS/BLUE SHIELD | Admitting: Family Medicine

## 2015-08-17 LAB — COMPREHENSIVE METABOLIC PANEL
ALBUMIN: 4 g/dL (ref 3.5–5.2)
ALT: 23 U/L (ref 0–35)
AST: 20 U/L (ref 0–37)
Alkaline Phosphatase: 84 U/L (ref 39–117)
BUN: 27 mg/dL — AB (ref 6–23)
CHLORIDE: 104 meq/L (ref 96–112)
CO2: 29 meq/L (ref 19–32)
CREATININE: 0.78 mg/dL (ref 0.40–1.20)
Calcium: 9.9 mg/dL (ref 8.4–10.5)
GFR: 82.24 mL/min (ref >60.00)
Glucose, Bld: 84 mg/dL (ref 70–99)
Potassium: 4.7 mEq/L (ref 3.5–5.1)
SODIUM: 138 meq/L (ref 135–145)
Total Bilirubin: 0.3 mg/dL (ref 0.2–1.2)
Total Protein: 7.1 g/dL (ref 6.0–8.3)

## 2015-08-17 LAB — LIPID PANEL
CHOL/HDL RATIO: 4
CHOLESTEROL: 171 mg/dL (ref 0–200)
HDL: 43.6 mg/dL (ref >39.00)
LDL CALC: 110 mg/dL — AB (ref 0–99)
NonHDL: 127.19
Triglycerides: 88 mg/dL (ref 0.0–149.0)
VLDL: 17.6 mg/dL (ref 0.0–40.0)

## 2015-09-04 ENCOUNTER — Ambulatory Visit: Payer: BLUE CROSS/BLUE SHIELD | Admitting: Family Medicine

## 2015-09-10 ENCOUNTER — Ambulatory Visit (INDEPENDENT_AMBULATORY_CARE_PROVIDER_SITE_OTHER): Payer: BLUE CROSS/BLUE SHIELD | Admitting: Family Medicine

## 2015-09-10 ENCOUNTER — Encounter: Payer: Self-pay | Admitting: Family Medicine

## 2015-09-10 NOTE — Progress Notes (Signed)
Patient ID: Caroline Ward, female    DOB: 1960/03/08  Age: 55 y.o. MRN: [id number]    Subjective:  Subjective  HPI Caroline Ward presents for f/u weight.  She is doing more exercise and watching her diet.  No complaints.     Review of Systems  Constitutional: Negative for appetite change, diaphoresis, fatigue and unexpected weight change.  Eyes: Negative for pain, redness and visual disturbance.  Respiratory: Negative for cough, chest tightness, shortness of breath and wheezing.   Cardiovascular: Negative for chest pain, palpitations and leg swelling.  Endocrine: Negative for cold intolerance, heat intolerance, polydipsia, polyphagia and polyuria.  Genitourinary: Negative for difficulty urinating, dysuria and frequency.  Neurological: Negative for dizziness, light-headedness, numbness and headaches.  Psychiatric/Behavioral: Negative for dysphoric mood and sleep disturbance. The patient is not nervous/anxious.     History Past Medical History:  Diagnosis Date  . Anxiety   . Asthma    on inhaler  . Depression   . GERD (gastroesophageal reflux disease)   . Heart murmur   . Hemorrhoids    in past  . Migraine   . Pneumonia    3 years ago/has bronchitis often  . UTI (urinary tract infection)    in past    She has a past surgical history that includes Wisdom tooth extraction.   Her family history includes Alcohol abuse in her father; Breast cancer in her maternal aunt; COPD in her mother; Cancer in her paternal uncle; Colon polyps in her mother and sister; Dementia in her maternal aunt; Hyperlipidemia in her mother; Hypertension in her mother; Liver disease in her maternal aunt; Lung cancer in her father; Other in her brother and sister; Pancreatic cancer in her brother; Throat cancer in her father.She reports that she has never smoked. She has never used smokeless tobacco. She reports that she does not drink alcohol or use drugs.  Current Outpatient Prescriptions on File Prior to  Visit  Medication Sig Dispense Refill  . acetaminophen (TYLENOL) 500 MG tablet Take 500 mg by mouth as needed.    . ALPRAZolam (XANAX) 0.25 MG tablet TAKE 1 TABLET BY MOUTH ONCE DAILY AS NEEDED FOR SLEEP 30 tablet 0  . beclomethasone (QVAR) 80 MCG/ACT inhaler Inhale 1 puff into the lungs daily.    . Biotin 1000 MCG tablet Take 1,000 mcg by mouth 3 (three) times daily.    . Calcium-Magnesium-Vitamin D [id number] MG-MG-UNIT CHEW Chew 1 capsule by mouth daily.    . citalopram (CELEXA) 20 MG tablet TAKE 1 TABLET BY MOUTH EVERY DAY 30 tablet 1  . Echinacea 125 MG CAPS Take by mouth.    . famotidine (PEPCID) 20 MG tablet One at bedtime 30 tablet 11  . fluticasone (FLONASE) 50 MCG/ACT nasal spray Place 2 sprays into both nostrils daily. 16 g 6  . Multiple Vitamin (MULTIVITAMIN) capsule Take 1 capsule by mouth daily.    . naproxen (NAPROSYN) 500 MG tablet TAKE 1 TABLET BY MOUTH TWICE DAILY WITH MEALS 180 tablet 0  . Omega-3 Fatty Acids (FISH OIL) 500 MG CAPS Take by mouth.    . pantoprazole (PROTONIX) 40 MG tablet TAKE 1 TABLET BY MOUTH EVERY DAILY 30 TO 60 MINUTES BEFORE FIRST MEAL OF THE DAY 90 tablet 3  . VESICARE 10 MG tablet TAKE 1 TABLET BY MOUTH EVERY DAY 30 tablet 5  . vitamin C (ASCORBIC ACID) 250 MG tablet Take 250 mg by mouth daily.     No current facility-administered medications on file prior to  visit.      Objective:  Objective  Physical Exam  Constitutional: She is oriented to person, place, and time. She appears well-developed and well-nourished.  HENT:  Head: Normocephalic and atraumatic.  Eyes: Conjunctivae and EOM are normal.  Neck: Normal range of motion. Neck supple. No JVD present. Carotid bruit is not present. No thyromegaly present.  Cardiovascular: Normal rate, regular rhythm and normal heart sounds.   No murmur heard. Pulmonary/Chest: Effort normal and breath sounds normal. No respiratory distress. She has no wheezes. She has no rales. She exhibits no tenderness.    Musculoskeletal: She exhibits no edema.  Neurological: She is alert and oriented to person, place, and time.  Psychiatric: She has a normal mood and affect. Her behavior is normal. Judgment and thought content normal.  Nursing note and vitals reviewed.  BP 118/76 (BP Location: Left Arm, Patient Position: Sitting, Cuff Size: Large)   Pulse 93   Temp 98.9 F (37.2 C) (Oral)   Ht 5\' 7"  (1.702 m)   Wt 271 lb 3.2 oz (123 kg)   SpO2 97%   BMI 42.48 kg/m  Wt Readings from Last 3 Encounters:  09/10/15 271 lb 3.2 oz (123 kg)  08/16/15 282 lb 9.6 oz (128.2 kg)  07/24/15 285 lb 3.2 oz (129.4 kg)     Lab Results  Component Value Date   WBC 11.7 (H) 08/15/2014   HGB 13.2 08/15/2014   HCT 40.3 08/15/2014   PLT 323.0 08/15/2014   GLUCOSE 84 08/16/2015   CHOL 171 08/16/2015   TRIG 88.0 08/16/2015   HDL 43.60 08/16/2015   LDLDIRECT 144.8 05/19/2011   LDLCALC 110 (H) 08/16/2015   ALT 23 08/16/2015   AST 20 08/16/2015   NA 138 08/16/2015   K 4.7 08/16/2015   CL 104 08/16/2015   CREATININE 0.78 08/16/2015   BUN 27 (H) 08/16/2015   CO2 29 08/16/2015   TSH 1.93 07/25/2014    Dg Chest 2 View  Result Date: 05/02/2015 CLINICAL DATA:  Preoperative evaluation for bariatric surgery; morbid obesity EXAM: CHEST  2 VIEW COMPARISON:  March 31, 2014 FINDINGS: There is slight atelectatic change in the left mid lung and left base regions. Lungs elsewhere clear. Heart size and pulmonary vascularity are within normal limits. No adenopathy. No bone lesions. IMPRESSION: Mild left mid lung and left base atelectatic change. No edema or consolidation. Stable cardiac silhouette. Electronically Signed   By: Lowella Grip III M.D.   On: 05/02/2015 10:45   Dg Ugi  W/kub  Result Date: 05/02/2015 CLINICAL DATA:  Preop gastric bypass surgery. EXAM: UPPER GI SERIES WITH KUB TECHNIQUE: After obtaining a scout radiograph a routine upper GI series was performed using thin barium FLUOROSCOPY TIME:  Radiation  Exposure Index (as provided by the fluoroscopic device): 28.6 mGy If the device does not provide the exposure index: Fluoroscopy Time (in minutes and seconds):  1 minutes Number of Acquired Images: COMPARISON:  None. FINDINGS: The scout radiograph is unremarkable. The esophagus appears patent. No stricture or mass. Normal anatomy of the stomach, duodenal bulb and duodenal Celsius loop. No hiatal hernia or reflux identified. IMPRESSION: 1. Normal upper GI. Electronically Signed   By: Kerby Moors M.D.   On: 05/02/2015 11:46   US Abdomen Limited Ruq  Result Date: 05/02/2015 CLINICAL DATA:  Morbid obesity. EXAM: US ABDOMEN LIMITED - RIGHT UPPER QUADRANT COMPARISON:  None. FINDINGS: Gallbladder: No gallstones or wall thickening visualized. No sonographic Murphy sign noted by sonographer. Common bile duct: Diameter: 4.1  mm which is within normal limits. Liver: No focal lesion identified. Increased echogenicity is noted suggesting fatty infiltration. IMPRESSION: Probable fatty infiltration of the liver. No other abnormality seen in the right upper quadrant of the abdomen. Electronically Signed   By: Marijo Conception, M.D.   On: 05/02/2015 09:56     Assessment & Plan:  Plan  I have discontinued Ms. Boldon's Erythromycin. I am also having her maintain her ALPRAZolam, Calcium-Magnesium-Vitamin D, famotidine, multivitamin, beclomethasone, fluticasone, Fish Oil, acetaminophen, pantoprazole, naproxen, Echinacea, VESICARE, vitamin C, Biotin, and citalopram.  No orders of the defined types were placed in this encounter.   Problem List Items Addressed This Visit    None    Visit Diagnoses    Morbid obesity due to excess calories ([place])    -  Primary    con't with diet and exercise,  F/u bariatric surgeon Follow-up: Return in about 4 weeks (around 10/08/2015), or obesity.  Ann Held, DO

## 2015-09-10 NOTE — Progress Notes (Signed)
Pre visit review using our clinic review tool, if applicable. No additional management support is needed unless otherwise documented below in the visit note. 

## 2015-09-10 NOTE — Patient Instructions (Signed)

## 2015-09-10 NOTE — Assessment & Plan Note (Signed)
con't with diet and exercise rto 1 month

## 2015-09-11 ENCOUNTER — Encounter: Payer: Self-pay | Admitting: Family Medicine

## 2015-09-11 NOTE — Telephone Encounter (Signed)
Ok to fax ov from yesterday

## 2015-09-19 ENCOUNTER — Other Ambulatory Visit: Payer: Self-pay | Admitting: Family Medicine

## 2015-09-19 NOTE — Telephone Encounter (Signed)
Last Naproxen RF:  03/14/15, #180. Next OV: 10/04/15.  Please advise request?

## 2015-09-25 ENCOUNTER — Encounter: Payer: Self-pay | Admitting: Family Medicine

## 2015-09-25 ENCOUNTER — Ambulatory Visit: Payer: BLUE CROSS/BLUE SHIELD | Admitting: Family Medicine

## 2015-09-25 ENCOUNTER — Other Ambulatory Visit: Payer: Self-pay | Admitting: Family Medicine

## 2015-09-25 DIAGNOSIS — L309 Dermatitis, unspecified: Secondary | ICD-10-CM

## 2015-09-26 ENCOUNTER — Encounter: Payer: Self-pay | Admitting: Family Medicine

## 2015-10-04 ENCOUNTER — Ambulatory Visit: Payer: BLUE CROSS/BLUE SHIELD | Admitting: Family Medicine

## 2015-10-11 DIAGNOSIS — K130 Diseases of lips: Secondary | ICD-10-CM | POA: Diagnosis not present

## 2015-10-11 DIAGNOSIS — J3489 Other specified disorders of nose and nasal sinuses: Secondary | ICD-10-CM | POA: Diagnosis not present

## 2015-10-22 ENCOUNTER — Other Ambulatory Visit: Payer: Self-pay | Admitting: Family Medicine

## 2015-10-30 DIAGNOSIS — Z23 Encounter for immunization: Secondary | ICD-10-CM | POA: Diagnosis not present

## 2015-11-12 ENCOUNTER — Other Ambulatory Visit: Payer: Self-pay | Admitting: Family Medicine

## 2015-11-12 DIAGNOSIS — F411 Generalized anxiety disorder: Secondary | ICD-10-CM

## 2015-11-14 ENCOUNTER — Other Ambulatory Visit: Payer: Self-pay | Admitting: Family Medicine

## 2015-11-14 DIAGNOSIS — F411 Generalized anxiety disorder: Secondary | ICD-10-CM

## 2015-11-14 MED ORDER — ALPRAZOLAM 0.25 MG PO TABS: 0.2500 mg | ORAL_TABLET | Freq: Three times a day (TID) | ORAL | 0 refills | Status: DC | PRN

## 2015-11-14 NOTE — Telephone Encounter (Signed)
Pt is requesting refill on Alprazolam.  Last OV: 09/10/2015 Last Fill: 12/01/2012 #30 and 0RF UDS: Last 2014  Please advise.

## 2015-11-15 MED ORDER — ALPRAZOLAM 0.25 MG PO TABS: 0.2500 mg | ORAL_TABLET | Freq: Three times a day (TID) | ORAL | 0 refills | Status: DC | PRN

## 2015-11-15 NOTE — Telephone Encounter (Signed)
Rx faxed to [facility] pharmacy.  

## 2015-11-15 NOTE — Telephone Encounter (Signed)
Please advise. TL/CMA 

## 2015-12-26 ENCOUNTER — Other Ambulatory Visit: Payer: Self-pay | Admitting: Family Medicine

## 2015-12-26 DIAGNOSIS — K2100 Gastro-esophageal reflux disease with esophagitis, without bleeding: Secondary | ICD-10-CM

## 2016-01-08 ENCOUNTER — Telehealth: Payer: Self-pay | Admitting: Family Medicine

## 2016-01-08 NOTE — Telephone Encounter (Signed)
Pt has an appt scheduled with Dr. Etter Sjogren on Friday, 01/11/16.

## 2016-01-08 NOTE — Telephone Encounter (Signed)
Patient Name: Caroline Ward  DOB: [birth year]    Initial Comment Caller states she has had problems with her hands going numb, and going into both arms. Some underarm pain when she puts her arm straight. Hand numbness has been going on for years.   Nurse Assessment  Nurse: Thad Ranger RN, Langley Gauss Date/Time (Eastern Time): 01/08/2016 10:30:49 AM  Confirm and document reason for call. If symptomatic, describe symptoms. ---Caller states she has had problems with her hands going numb, and going into both arms. Some underarm pain when she puts her arm straight. Hand numbness has been going on for years.  Does the patient have any new or worsening symptoms? ---Yes  Will a triage be completed? ---Yes  Related visit to physician within the last 2 weeks? ---No  Does the PT have any chronic conditions? (i.e. diabetes, asthma, etc.) ---No  Is the patient pregnant or possibly pregnant? (Ask all females between the ages of 85-55) ---No  Is this a behavioral health or substance abuse call? ---No     Guidelines    Guideline Title Affirmed Question Affirmed Notes  Neurologic Deficit [1] Numbness or tingling in one or both hands AND [2] is a chronic symptom (recurrent or ongoing AND present > 4 weeks)    Final Disposition User   See PCP When Office is Open (within 3 days) Carmon, RN, [employer] pt will ck sched for appt and cb.  Returned call to the pt and advised appt made with Dr Garnet Koyanagi for Friday, 01/11/16 at 1100am. Pt agreeable with poc.   Referrals  REFERRED TO PCP OFFICE   Disagree/Comply: Comply

## 2016-01-11 ENCOUNTER — Encounter: Payer: Self-pay | Admitting: Family Medicine

## 2016-01-11 ENCOUNTER — Ambulatory Visit (INDEPENDENT_AMBULATORY_CARE_PROVIDER_SITE_OTHER): Payer: BLUE CROSS/BLUE SHIELD | Admitting: Family Medicine

## 2016-01-11 VITALS — BP 131/73 | HR 70 | Temp 98.4°F | Resp 16 | Ht 67.0 in | Wt 280.4 lb

## 2016-01-11 DIAGNOSIS — E669 Obesity, unspecified: Secondary | ICD-10-CM | POA: Diagnosis not present

## 2016-01-11 DIAGNOSIS — M722 Plantar fascial fibromatosis: Secondary | ICD-10-CM

## 2016-01-11 DIAGNOSIS — G5603 Carpal tunnel syndrome, bilateral upper limbs: Secondary | ICD-10-CM

## 2016-01-11 MED ORDER — LIRAGLUTIDE -WEIGHT MANAGEMENT 18 MG/3ML ~~LOC~~ SOPN: 3.0000 mg | PEN_INJECTOR | Freq: Every day | SUBCUTANEOUS | 3 refills | Status: DC

## 2016-01-11 MED ORDER — NAPROXEN 500 MG PO TABS: 500.0000 mg | ORAL_TABLET | Freq: Two times a day (BID) | ORAL | 3 refills | Status: DC

## 2016-01-11 NOTE — Progress Notes (Signed)
Pre visit review using our clinic review tool, if applicable. No additional management support is needed unless otherwise documented below in the visit note. 

## 2016-01-11 NOTE — Assessment & Plan Note (Addendum)
Discussed diet and exercise  rto 1 month

## 2016-01-11 NOTE — Progress Notes (Signed)
Patient ID: ANACRISTINA PORTANOVA, female    DOB: April 12, 1960  Age: 55 y.o. MRN: [id number]    Subjective:  Subjective  HPI EDITHA BONTRAGER presents for numbness in both hands and now extends to elbows  --- L >R Pt has had cts for years but recently extended to elbow.  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   She is also here to discuss weight.  She is not exercising-- but is watching diet again-- she gained 9 lbs since last ov.    Review of Systems  Constitutional: Negative for appetite change, diaphoresis, fatigue and unexpected weight change.  Eyes: Negative for pain, redness and visual disturbance.  Respiratory: Negative for cough, chest tightness, shortness of breath and wheezing.   Cardiovascular: Negative for chest pain, palpitations and leg swelling.  Endocrine: Negative for cold intolerance, heat intolerance, polydipsia, polyphagia and polyuria.  Genitourinary: Negative for difficulty urinating, dysuria and frequency.  Neurological: Negative for dizziness, light-headedness, numbness and headaches.    History Past Medical History:  Diagnosis Date  . Anxiety   . Asthma    on inhaler  . Depression   . GERD (gastroesophageal reflux disease)   . Heart murmur   . Hemorrhoids    in past  . Migraine   . Pneumonia    3 years ago/has bronchitis often  . UTI (urinary tract infection)    in past    She has a past surgical history that includes Wisdom tooth extraction.   Her family history includes Alcohol abuse in her father; Breast cancer in her maternal aunt; COPD in her mother; Cancer in her paternal uncle; Colon polyps in her mother and sister; Dementia in her maternal aunt; Hyperlipidemia in her mother; Hypertension in her mother; Liver disease in her maternal aunt; Lung cancer in her father; Other in her brother and sister; Pancreatic cancer in her brother; Throat cancer in her father.She reports that she has  never smoked. She has never used smokeless tobacco. She reports that she does not drink alcohol or use drugs.  Current Outpatient Prescriptions on File Prior to Visit  Medication Sig Dispense Refill  . acetaminophen (TYLENOL) 500 MG tablet Take 500 mg by mouth as needed.    . ALPRAZolam (XANAX) 0.25 MG tablet Take 1 tablet (0.25 mg total) by mouth 3 (three) times daily as needed for anxiety. 30 tablet 0  . beclomethasone (QVAR) 80 MCG/ACT inhaler Inhale 1 puff into the lungs daily.    . Biotin 1000 MCG tablet Take 1,000 mcg by mouth 3 (three) times daily.    . Calcium-Magnesium-Vitamin D [id number] MG-MG-UNIT CHEW Chew 1 capsule by mouth daily.    . citalopram (CELEXA) 20 MG tablet TAKE 1 TABLET BY MOUTH EVERY DAY 30 tablet 5  . Echinacea 125 MG CAPS Take by mouth.    . famotidine (PEPCID) 20 MG tablet One at bedtime 30 tablet 11  . fluticasone (FLONASE) 50 MCG/ACT nasal spray Place 2 sprays into both nostrils daily. 16 g 6  . Multiple Vitamin (MULTIVITAMIN) capsule Take 1 capsule by mouth daily.    . Omega-3 Fatty Acids (FISH OIL) 500 MG CAPS Take by mouth.    . pantoprazole (PROTONIX) 40 MG tablet TAKE 1 TABLET BY MOUTH EVERY DAY 30 TO 60 MINUTES BEFORE FIRST MEAL OF DAY 90 tablet 0  . VESICARE 10 MG tablet TAKE 1 TABLET BY MOUTH EVERY DAY 30 tablet 5  .  vitamin C (ASCORBIC ACID) 250 MG tablet Take 250 mg by mouth daily.     No current facility-administered medications on file prior to visit.      Objective:  Objective  Physical Exam  Constitutional: She is oriented to person, place, and time. She appears well-developed and well-nourished.  HENT:  Head: Normocephalic and atraumatic.  Eyes: Conjunctivae and EOM are normal.  Neck: Normal range of motion. Neck supple. No JVD present. Carotid bruit is not present. No thyromegaly present.  Cardiovascular: Normal rate, regular rhythm and normal heart sounds.   No murmur heard. Pulmonary/Chest: Effort normal and breath sounds normal. No  respiratory distress. She has no wheezes. She has no rales. She exhibits no tenderness.  Abdominal: Soft. Bowel sounds are normal. She exhibits no distension. There is no tenderness. There is no rebound and no guarding.  Musculoskeletal: She exhibits no edema.  Neurological: She is alert and oriented to person, place, and time. She displays normal reflexes. No cranial nerve deficit. She exhibits normal muscle tone. Coordination normal.  Numbness in both hands with flexion of wrist-- sparing 5th finger b/l  L >R   Skin: No rash noted. No erythema.  Psychiatric: She has a normal mood and affect.  Nursing note and vitals reviewed.  BP 131/73   Pulse 70   Temp 98.4 F (36.9 C) (Oral)   Resp 16   Ht 5\' 7"  (1.702 m)   Wt 280 lb 6.4 oz (127.2 kg)   LMP 04/29/2014   SpO2 97%   BMI 43.92 kg/m  Wt Readings from Last 3 Encounters:  01/11/16 280 lb 6.4 oz (127.2 kg)  09/10/15 271 lb 3.2 oz (123 kg)  08/16/15 282 lb 9.6 oz (128.2 kg)     Lab Results  Component Value Date   WBC 11.7 (H) 08/15/2014   HGB 13.2 08/15/2014   HCT 40.3 08/15/2014   PLT 323.0 08/15/2014   GLUCOSE 84 08/16/2015   CHOL 171 08/16/2015   TRIG 88.0 08/16/2015   HDL 43.60 08/16/2015   LDLDIRECT 144.8 05/19/2011   LDLCALC 110 (H) 08/16/2015   ALT 23 08/16/2015   AST 20 08/16/2015   NA 138 08/16/2015   K 4.7 08/16/2015   CL 104 08/16/2015   CREATININE 0.78 08/16/2015   BUN 27 (H) 08/16/2015   CO2 29 08/16/2015   TSH 1.93 07/25/2014    Dg Chest 2 View  Result Date: 05/02/2015 CLINICAL DATA:  Preoperative evaluation for bariatric surgery; morbid obesity EXAM: CHEST  2 VIEW COMPARISON:  March 31, 2014 FINDINGS: There is slight atelectatic change in the left mid lung and left base regions. Lungs elsewhere clear. Heart size and pulmonary vascularity are within normal limits. No adenopathy. No bone lesions. IMPRESSION: Mild left mid lung and left base atelectatic change. No edema or consolidation. Stable cardiac  silhouette. Electronically Signed   By: Lowella Grip III M.D.   On: 05/02/2015 10:45   Dg Ugi  W/kub  Result Date: 05/02/2015 CLINICAL DATA:  Preop gastric bypass surgery. EXAM: UPPER GI SERIES WITH KUB TECHNIQUE: After obtaining a scout radiograph a routine upper GI series was performed using thin barium FLUOROSCOPY TIME:  Radiation Exposure Index (as provided by the fluoroscopic device): 28.6 mGy If the device does not provide the exposure index: Fluoroscopy Time (in minutes and seconds):  1 minutes Number of Acquired Images: COMPARISON:  None. FINDINGS: The scout radiograph is unremarkable. The esophagus appears patent. No stricture or mass. Normal anatomy of the stomach, duodenal bulb and  duodenal Celsius loop. No hiatal hernia or reflux identified. IMPRESSION: 1. Normal upper GI. Electronically Signed   By: Kerby Moors M.D.   On: 05/02/2015 11:46   US Abdomen Limited Ruq  Result Date: 05/02/2015 CLINICAL DATA:  Morbid obesity. EXAM: US ABDOMEN LIMITED - RIGHT UPPER QUADRANT COMPARISON:  None. FINDINGS: Gallbladder: No gallstones or wall thickening visualized. No sonographic Murphy sign noted by sonographer. Common bile duct: Diameter: 4.1 mm which is within normal limits. Liver: No focal lesion identified. Increased echogenicity is noted suggesting fatty infiltration. IMPRESSION: Probable fatty infiltration of the liver. No other abnormality seen in the right upper quadrant of the abdomen. Electronically Signed   By: Marijo Conception, M.D.   On: 05/02/2015 09:56     Assessment & Plan:  Plan  I have changed Ms. Sherrin's naproxen. I am also having her start on Liraglutide -Weight Management. Additionally, I am having her maintain her Calcium-Magnesium-Vitamin D, famotidine, multivitamin, beclomethasone, fluticasone, Fish Oil, acetaminophen, Echinacea, VESICARE, vitamin C, Biotin, citalopram, ALPRAZolam, and pantoprazole.  Meds ordered this encounter  Medications  . naproxen (NAPROSYN) 500  MG tablet    Sig: Take 1 tablet (500 mg total) by mouth 2 (two) times daily with a meal.    Dispense:  180 tablet    Refill:  3  . Liraglutide -Weight Management (SAXENDA) 18 MG/3ML SOPN    Sig: Inject 3 mg into the skin daily.    Dispense:  3 pen    Refill:  3    Problem List Items Addressed This Visit      Unprioritized   Obesity (BMI 30-39.9)    Discussed diet and exercise  rto 1 month      Relevant Medications   Liraglutide -Weight Management (SAXENDA) 18 MG/3ML SOPN   Bilateral carpal tunnel syndrome - Primary    Splint given for Left If no relief will refer to hand surgeon for further evaluation      Relevant Medications   Liraglutide -Weight Management (SAXENDA) 18 MG/3ML SOPN    Other Visit Diagnoses    Plantar fasciitis       Relevant Medications   naproxen (NAPROSYN) 500 MG tablet      Follow-up: Return in about 4 weeks (around 02/08/2016), or weight check.  Ann Held, DO

## 2016-01-11 NOTE — Patient Instructions (Signed)
Carpal Tunnel Syndrome Introduction Carpal tunnel syndrome is a condition that causes pain in your hand and arm. The carpal tunnel is a narrow area that is on the palm side of your wrist. Repeated wrist motion or certain diseases may cause swelling in the tunnel. This swelling can pinch the main nerve in the wrist (median nerve). Follow these instructions at home: If you have a splint:  Wear it as told by your doctor. Remove it only as told by your doctor.  Loosen the splint if your fingers:  Become numb and tingle.  Turn blue and cold.  Keep the splint clean and dry. General instructions  Take over-the-counter and prescription medicines only as told by your doctor.  Rest your wrist from any activity that may be causing your pain. If needed, talk to your employer about changes that can be made in your work, such as getting a wrist pad to use while typing.  If directed, apply ice to the painful area:  Put ice in a plastic bag.  Place a towel between your skin and the bag.  Leave the ice on for 20 minutes, 2-3 times per day.  Keep all follow-up visits as told by your doctor. This is important.  Do any exercises as told by your doctor, physical therapist, or occupational therapist. Contact a doctor if:  You have new symptoms.  Medicine does not help your pain.  Your symptoms get worse. This information is not intended to replace advice given to you by your health care provider. Make sure you discuss any questions you have with your health care provider. Document Released: 12/19/2010 Document Revised: 06/07/2015 Document Reviewed: 05/17/2014  2017 Elsevier

## 2016-01-13 DIAGNOSIS — G5603 Carpal tunnel syndrome, bilateral upper limbs: Secondary | ICD-10-CM | POA: Insufficient documentation

## 2016-01-13 NOTE — Assessment & Plan Note (Signed)
Splint given for Left If no relief will refer to hand surgeon for further evaluation

## 2016-01-15 ENCOUNTER — Telehealth: Payer: Self-pay

## 2016-01-15 NOTE — Telephone Encounter (Signed)
PA initiated via Covermymeds; KEY: [id number]. Awaiting determination.

## 2016-01-16 NOTE — Telephone Encounter (Signed)
Received PA approval via Covermymeds. Effective from 01/15/2016 through 05/19/2016. [facility] pharmacy informed of approval.

## 2016-01-22 ENCOUNTER — Telehealth: Payer: Self-pay | Admitting: *Deleted

## 2016-01-22 DIAGNOSIS — F411 Generalized anxiety disorder: Secondary | ICD-10-CM

## 2016-01-22 MED ORDER — ALPRAZOLAM 0.25 MG PO TABS
0.2500 mg | ORAL_TABLET | Freq: Three times a day (TID) | ORAL | 0 refills | Status: DC | PRN
Start: 2016-01-22 — End: 2016-04-22

## 2016-01-22 NOTE — Telephone Encounter (Signed)
Patient notified and she will be in to pickup rx and to fill out contract and do a UDS.

## 2016-01-22 NOTE — Telephone Encounter (Signed)
Refill x1 Need uds and contract 

## 2016-01-22 NOTE — Telephone Encounter (Signed)
Rx placed up front for pickup along with contract.

## 2016-01-22 NOTE — Telephone Encounter (Signed)
[facility] sent a fax for refill request for alprazolam.  Last OV 01/11/16 Next OV 02/19/16 Last rx from [place] 11/15/15  No current substance controlled contract signed and no current UDS.   Would you like to update a current contract and collect a drug screen?  She is on this as needed.

## 2016-01-28 ENCOUNTER — Other Ambulatory Visit: Payer: Self-pay

## 2016-02-19 ENCOUNTER — Ambulatory Visit: Payer: BLUE CROSS/BLUE SHIELD | Admitting: Family Medicine

## 2016-03-03 ENCOUNTER — Other Ambulatory Visit: Payer: Self-pay | Admitting: Family Medicine

## 2016-03-03 DIAGNOSIS — Z1231 Encounter for screening mammogram for malignant neoplasm of breast: Secondary | ICD-10-CM

## 2016-03-05 DIAGNOSIS — M79644 Pain in right finger(s): Secondary | ICD-10-CM | POA: Diagnosis not present

## 2016-03-05 DIAGNOSIS — S61314A Laceration without foreign body of right ring finger with damage to nail, initial encounter: Secondary | ICD-10-CM | POA: Diagnosis not present

## 2016-03-05 DIAGNOSIS — S62634A Displaced fracture of distal phalanx of right ring finger, initial encounter for closed fracture: Secondary | ICD-10-CM | POA: Diagnosis not present

## 2016-03-05 DIAGNOSIS — S62634B Displaced fracture of distal phalanx of right ring finger, initial encounter for open fracture: Secondary | ICD-10-CM | POA: Diagnosis not present

## 2016-03-07 DIAGNOSIS — X58XXXA Exposure to other specified factors, initial encounter: Secondary | ICD-10-CM | POA: Diagnosis not present

## 2016-03-07 DIAGNOSIS — Z79899 Other long term (current) drug therapy: Secondary | ICD-10-CM | POA: Diagnosis not present

## 2016-03-07 DIAGNOSIS — Z888 Allergy status to other drugs, medicaments and biological substances status: Secondary | ICD-10-CM | POA: Diagnosis not present

## 2016-03-07 DIAGNOSIS — K219 Gastro-esophageal reflux disease without esophagitis: Secondary | ICD-10-CM | POA: Diagnosis not present

## 2016-03-07 DIAGNOSIS — S62634A Displaced fracture of distal phalanx of right ring finger, initial encounter for closed fracture: Secondary | ICD-10-CM | POA: Diagnosis not present

## 2016-03-07 DIAGNOSIS — F419 Anxiety disorder, unspecified: Secondary | ICD-10-CM | POA: Diagnosis not present

## 2016-03-07 DIAGNOSIS — S62634B Displaced fracture of distal phalanx of right ring finger, initial encounter for open fracture: Secondary | ICD-10-CM | POA: Diagnosis not present

## 2016-03-07 DIAGNOSIS — S61314A Laceration without foreign body of right ring finger with damage to nail, initial encounter: Secondary | ICD-10-CM | POA: Diagnosis not present

## 2016-03-07 DIAGNOSIS — J45909 Unspecified asthma, uncomplicated: Secondary | ICD-10-CM | POA: Diagnosis not present

## 2016-03-07 DIAGNOSIS — R51 Headache: Secondary | ICD-10-CM | POA: Diagnosis not present

## 2016-03-11 DIAGNOSIS — S6991XD Unspecified injury of right wrist, hand and finger(s), subsequent encounter: Secondary | ICD-10-CM | POA: Diagnosis not present

## 2016-03-19 DIAGNOSIS — S62634D Displaced fracture of distal phalanx of right ring finger, subsequent encounter for fracture with routine healing: Secondary | ICD-10-CM | POA: Diagnosis not present

## 2016-03-19 DIAGNOSIS — Z9889 Other specified postprocedural states: Secondary | ICD-10-CM | POA: Diagnosis not present

## 2016-03-28 ENCOUNTER — Other Ambulatory Visit: Payer: Self-pay | Admitting: Family Medicine

## 2016-03-28 DIAGNOSIS — K2100 Gastro-esophageal reflux disease with esophagitis, without bleeding: Secondary | ICD-10-CM

## 2016-04-01 ENCOUNTER — Other Ambulatory Visit: Payer: Self-pay | Admitting: Family Medicine

## 2016-04-01 DIAGNOSIS — Z1231 Encounter for screening mammogram for malignant neoplasm of breast: Secondary | ICD-10-CM

## 2016-04-02 ENCOUNTER — Ambulatory Visit: Payer: BLUE CROSS/BLUE SHIELD

## 2016-04-03 ENCOUNTER — Ambulatory Visit: Payer: BLUE CROSS/BLUE SHIELD

## 2016-04-07 ENCOUNTER — Encounter: Payer: Self-pay | Admitting: Medical

## 2016-04-07 ENCOUNTER — Ambulatory Visit (INDEPENDENT_AMBULATORY_CARE_PROVIDER_SITE_OTHER): Payer: BLUE CROSS/BLUE SHIELD | Admitting: Medical

## 2016-04-07 VITALS — BP 136/96 | HR 98 | Temp 98.8°F | Resp 16 | Ht 67.0 in | Wt 283.1 lb

## 2016-04-07 DIAGNOSIS — R6889 Other general symptoms and signs: Secondary | ICD-10-CM

## 2016-04-07 DIAGNOSIS — H6690 Otitis media, unspecified, unspecified ear: Secondary | ICD-10-CM

## 2016-04-07 DIAGNOSIS — J0100 Acute maxillary sinusitis, unspecified: Secondary | ICD-10-CM | POA: Diagnosis not present

## 2016-04-07 DIAGNOSIS — R05 Cough: Secondary | ICD-10-CM

## 2016-04-07 DIAGNOSIS — R059 Cough, unspecified: Secondary | ICD-10-CM

## 2016-04-07 LAB — POCT RAPID STREP A (OFFICE): RAPID STREP A SCREEN: NEGATIVE

## 2016-04-07 LAB — POC INFLUENZA A&B (BINAX/QUICKVUE)
INFLUENZA B, POC: NEGATIVE
Influenza A, POC: NEGATIVE

## 2016-04-07 MED ORDER — AMOXICILLIN-POT CLAVULANATE 875-125 MG PO TABS: 1.0000 | ORAL_TABLET | Freq: Two times a day (BID) | ORAL | 0 refills | Status: DC

## 2016-04-07 MED ORDER — BENZONATATE 100 MG PO CAPS: 100.0000 mg | ORAL_CAPSULE | Freq: Three times a day (TID) | ORAL | 0 refills | Status: DC | PRN

## 2016-04-07 NOTE — Progress Notes (Signed)
Pre visit review using our clinic review tool, if applicable. No additional management support is needed unless otherwise documented below in the visit note/SLS  

## 2016-04-07 NOTE — Progress Notes (Signed)
Subjective:    Patient ID: Caroline Ward, female    DOB: 01/22/1960, 56 y.o.   MRN: [id number]  HPI  Wed am had bad HA and then later in day fever, chills, sweats and diffuse body aches. Next day very fatigues. Then over weekend nasal congestion, sinus pressure and chest congestion. Cough is dry. Some mucous when she blows her nose. She states gets bronchitis easily.  Some wheezing with activity.   Pt has albuterol and qvar available if needed.     Review of Systems  Constitutional: Positive for chills, fatigue and fever.  HENT: Positive for ear pain, sinus pressure and sore throat. Negative for sneezing, tinnitus and voice change.   Respiratory: Positive for cough and wheezing. Negative for chest tightness and shortness of breath.   Cardiovascular: Negative for chest pain and palpitations.  Gastrointestinal: Negative for abdominal pain, nausea and vomiting.  Musculoskeletal: Positive for myalgias. Negative for back pain.       Last wed night.  Neurological: Negative for dizziness, seizures, weakness, numbness and headaches.  Hematological: Negative for adenopathy. Does not bruise/bleed easily.    Past Medical History:  Diagnosis Date  . Anxiety   . Asthma    on inhaler  . Depression   . GERD (gastroesophageal reflux disease)   . Heart murmur   . Hemorrhoids    in past  . Migraine   . Pneumonia    3 years ago/has bronchitis often  . UTI (urinary tract infection)    in past     Social History   Social History  . Marital status: Single    Spouse name: N/A  . Number of children: 0  . Years of education: N/A   Occupational History  . self employed-- [employer] History Main Topics  . Smoking status: Never Smoker  . Smokeless tobacco: Never Used  . Alcohol use No     Comment: Occ  . Drug use: No  . Sexual activity: Not Currently    Partners: Male   Other Topics Concern  . Not on file   Social History Narrative   Exercise--no    Past Surgical History:  Procedure Laterality Date  . WISDOM TOOTH EXTRACTION      Family History  Problem Relation Age of Onset  . Lung cancer Father   . Alcohol abuse Father   . Throat cancer Father   . Liver disease Maternal Aunt   . Breast cancer Maternal Aunt   . Dementia Maternal Aunt   . Hyperlipidemia Mother   . COPD Mother   . Hypertension Mother   . Colon polyps Mother   . Pancreatic cancer Brother     pancreas  . Cancer Paternal Uncle     type unbknown  . Other Brother     vein issues  . Other Sister   . Colon polyps Sister     Allergies  Allergen Reactions  . Myrbetriq [Mirabegron] Itching    Current Outpatient Prescriptions on File Prior to Visit  Medication Sig Dispense Refill  . acetaminophen (TYLENOL) 500 MG tablet Take 500 mg by mouth as needed.    . ALPRAZolam (XANAX) 0.25 MG tablet Take 1 tablet (0.25 mg total) by mouth 3 (three) times daily as needed for anxiety. 30 tablet 0  . beclomethasone (QVAR) 80 MCG/ACT inhaler Inhale 1 puff into the lungs daily.    . Biotin 1000 MCG tablet Take 1,000 mcg by mouth 3 (three) times daily.    Marland Kitchen  Calcium-Magnesium-Vitamin D [id number] MG-MG-UNIT CHEW Chew 1 capsule by mouth daily.    . citalopram (CELEXA) 20 MG tablet TAKE 1 TABLET BY MOUTH EVERY DAY 30 tablet 5  . Echinacea 125 MG CAPS Take by mouth.    . famotidine (PEPCID) 20 MG tablet One at bedtime 30 tablet 11  . fluticasone (FLONASE) 50 MCG/ACT nasal spray Place 2 sprays into both nostrils daily. 16 g 6  . Multiple Vitamin (MULTIVITAMIN) capsule Take 1 capsule by mouth daily.    . naproxen (NAPROSYN) 500 MG tablet Take 1 tablet (500 mg total) by mouth 2 (two) times daily with a meal. 180 tablet 3  . Omega-3 Fatty Acids (FISH OIL) 500 MG CAPS Take by mouth.    . pantoprazole (PROTONIX) 40 MG tablet TAKE 1 TABLET BY MOUTH EVERY DAY 30-60 MINUTES BEFORE FIRST MEAL 90 tablet 1  . vitamin C (ASCORBIC ACID) 250 MG tablet Take 250 mg by mouth  daily.     No current facility-administered medications on file prior to visit.     BP (!) 136/96 (BP Location: Right Arm, Patient Position: Sitting, Cuff Size: Large)   Pulse 98   Temp 98.8 F (37.1 C) (Oral)   Resp 16   Ht 5\' 7"  (1.702 m)   Wt 283 lb 2 oz (128.4 kg)   LMP 04/29/2014   SpO2 97%   BMI 44.34 kg/m       Objective:   Physical Exam   General  Mental Status - Alert. General Appearance - Well groomed. Not in acute distress.  Skin Rashes- No Rashes.  HEENT Head- Normal. Ear Auditory Canal - Left- Normal. Right - Normal.Tympanic Membrane- Left- red. Right- Normal. Eye Sclera/Conjunctiva- Left- Normal. Right- Normal. Nose & Sinuses Nasal Mucosa- Left-  Boggy and Congested. Right-  Boggy and  Congested.Bilateral maxillary and frontal sinus pressure. Mouth & Throat Lips: Upper Lip- Normal: no dryness, cracking, pallor, cyanosis, or vesicular eruption. Lower Lip-Normal: no dryness, cracking, pallor, cyanosis or vesicular eruption. Buccal Mucosa- Bilateral- No Aphthous ulcers. Oropharynx- No Discharge or Erythema. Tonsils: Characteristics- Bilateral- No Erythema or Congestion. Size/Enlargement- Bilateral- No enlargement. Discharge- bilateral-None.  Neck Neck- Supple. No Masses.   Chest and Lung Exam Auscultation: Breath Sounds:-Clear even and unlabored.  Cardiovascular Auscultation:Rythm- Regular, rate and rhythm. Murmurs & Other Heart Sounds:Ausculatation of the heart reveal- No Murmurs.  Lymphatic Head & Neck General Head & Neck Lymphatics: Bilateral: Description- No Localized lymphadenopathy.      Assessment & Plan:  Recent flu like illness followed by secondary infection signs and symptoms.  Your appear to have a sinus infection and left ear infection. I am prescribing antibiotic augmentin for the infection. To help with the nasal congestion I prescribed flonase nasal steroid nasal, I prescribed cough medicine benzonatate.  Rest, hydrate,  tylenol for fever.  Follow up in 7 days or as needed.  Rapid strep and flu vaccine was negative.  Makelle Marrone, Percell Miller, PA-C

## 2016-04-07 NOTE — Patient Instructions (Addendum)
Recent flu like illness followed by secondary infection signs and symptoms.  Your appear to have a sinus infection and left ear infection. I am prescribing antibiotic augmentin for the infection. To help with the nasal congestion I prescribed flonase nasal steroid nasal, I prescribed cough medicine benzonatate.  Rest, hydrate, tylenol for fever.  Follow up in 7 days or as needed.  Encourged to use inhalers if needed as she has at home.

## 2016-04-17 ENCOUNTER — Ambulatory Visit (HOSPITAL_BASED_OUTPATIENT_CLINIC_OR_DEPARTMENT_OTHER)
Admission: RE | Admit: 2016-04-17 | Discharge: 2016-04-17 | Disposition: A | Discharge status: Home / Self Care | Payer: BLUE CROSS/BLUE SHIELD | Source: Ambulatory Visit | Attending: Family Medicine | Admitting: Family Medicine

## 2016-04-17 ENCOUNTER — Ambulatory Visit (HOSPITAL_BASED_OUTPATIENT_CLINIC_OR_DEPARTMENT_OTHER): Payer: BLUE CROSS/BLUE SHIELD

## 2016-04-17 DIAGNOSIS — Z1231 Encounter for screening mammogram for malignant neoplasm of breast: Secondary | ICD-10-CM | POA: Insufficient documentation

## 2016-04-18 ENCOUNTER — Other Ambulatory Visit: Payer: Self-pay | Admitting: Family Medicine

## 2016-04-22 ENCOUNTER — Ambulatory Visit (INDEPENDENT_AMBULATORY_CARE_PROVIDER_SITE_OTHER): Payer: BLUE CROSS/BLUE SHIELD | Admitting: Family Medicine

## 2016-04-22 VITALS — BP 128/70 | HR 81 | Temp 98.5°F | Resp 16 | Ht 67.0 in | Wt 285.2 lb

## 2016-04-22 DIAGNOSIS — J029 Acute pharyngitis, unspecified: Secondary | ICD-10-CM | POA: Diagnosis not present

## 2016-04-22 DIAGNOSIS — J020 Streptococcal pharyngitis: Secondary | ICD-10-CM | POA: Diagnosis not present

## 2016-04-22 DIAGNOSIS — F411 Generalized anxiety disorder: Secondary | ICD-10-CM | POA: Diagnosis not present

## 2016-04-22 MED ORDER — ALPRAZOLAM 0.25 MG PO TABS: 0.2500 mg | ORAL_TABLET | Freq: Three times a day (TID) | ORAL | 0 refills | Status: DC | PRN

## 2016-04-22 MED ORDER — AMOXICILLIN 875 MG PO TABS: 875.0000 mg | ORAL_TABLET | Freq: Two times a day (BID) | ORAL | 0 refills | Status: DC

## 2016-04-22 NOTE — Progress Notes (Signed)
Pre visit review using our clinic review tool, if applicable. No additional management support is needed unless otherwise documented below in the visit note. 

## 2016-04-22 NOTE — Patient Instructions (Signed)
Strep Throat Strep throat is a bacterial infection of the throat. Your health care provider may call the infection tonsillitis or pharyngitis, depending on whether there is swelling in the tonsils or at the back of the throat. Strep throat is most common during the cold months of the year in children who are 5-54 years of age, but it can happen during any season in people of any age. This infection is spread from person to person (contagious) through coughing, sneezing, or close contact. What are the causes? Strep throat is caused by the bacteria called Streptococcus pyogenes. What increases the risk? This condition is more likely to develop in:  People who spend time in crowded places where the infection can spread easily.  People who have close contact with someone who has strep throat.  What are the signs or symptoms? Symptoms of this condition include:  Fever or chills.  Redness, swelling, or pain in the tonsils or throat.  Pain or difficulty when swallowing.  White or yellow spots on the tonsils or throat.  Swollen, tender glands in the neck or under the jaw.  Red rash all over the body (rare).  How is this diagnosed? This condition is diagnosed by performing a rapid strep test or by taking a swab of your throat (throat culture test). Results from a rapid strep test are usually ready in a few minutes, but throat culture test results are available after one or two days. How is this treated? This condition is treated with antibiotic medicine. Follow these instructions at home: Medicines  Take over-the-counter and prescription medicines only as told by your health care provider.  Take your antibiotic as told by your health care provider. Do not stop taking the antibiotic even if you start to feel better.  Have family members who also have a sore throat or fever tested for strep throat. They may need antibiotics if they have the strep infection. Eating and drinking  Do not  share food, drinking cups, or personal items that could cause the infection to spread to other people.  If swallowing is difficult, try eating soft foods until your sore throat feels better.  Drink enough fluid to keep your urine clear or pale yellow. General instructions  Gargle with a salt-water mixture 3-4 times per day or as needed. To make a salt-water mixture, completely dissolve -1 tsp of salt in 1 cup of warm water.  Make sure that all household members wash their hands well.  Get plenty of rest.  Stay home from school or work until you have been taking antibiotics for 24 hours.  Keep all follow-up visits as told by your health care provider. This is important. Contact a health care provider if:  The glands in your neck continue to get bigger.  You develop a rash, cough, or earache.  You cough up a thick liquid that is green, yellow-brown, or bloody.  You have pain or discomfort that does not get better with medicine.  Your problems seem to be getting worse rather than better.  You have a fever. Get help right away if:  You have new symptoms, such as vomiting, severe headache, stiff or painful neck, chest pain, or shortness of breath.  You have severe throat pain, drooling, or changes in your voice.  You have swelling of the neck, or the skin on the neck becomes red and tender.  You have signs of dehydration, such as fatigue, dry mouth, and decreased urination.  You become increasingly sleepy, or   you cannot wake up completely.  Your joints become red or painful. This information is not intended to replace advice given to you by your health care provider. Make sure you discuss any questions you have with your health care provider. Document Released: 12/28/1999 Document Revised: 08/29/2015 Document Reviewed: 04/24/2014 Elsevier Interactive Patient Education  2017 Elsevier Inc.  

## 2016-04-22 NOTE — Progress Notes (Signed)
Patient ID: Caroline Ward, female   DOB: 18-Jan-1960, 56 y.o.   MRN: [id number]     Subjective:    Patient ID: Caroline Ward, female    DOB: 1960-10-01, 56 y.o.   MRN: [id number]  I acted as a Education administrator for Dr. Carollee Herter.  Guerry Bruin, CMA  Chief Complaint  Patient presents with  . Sore Throat    right ear pain and headache  . Medication Refill    alprazolam    Sore Throat   This is a new problem. Episode onset: 2 weeks ago but worse in the last day and a half. The pain is worse on the right side. There has been no fever. Associated symptoms include ear pain, headaches and trouble swallowing. Pertinent negatives include no abdominal pain, congestion, coughing, diarrhea, drooling, ear discharge, hoarse voice, plugged ear sensation, neck pain, shortness of breath, stridor, swollen glands or vomiting. She has tried acetaminophen for the symptoms.  Medication Refill  Associated symptoms include headaches. Pertinent negatives include no abdominal pain, congestion, coughing, neck pain, swollen glands or vomiting.    Patient is in today for sore throat accompanied by right ear pain and headache.  She also needs a refill on Xanax that she takes rarely.  Wanted to discuss by pass surgery for weight loss.  She is trying to follow low carb diet and trying to increase exercise.    Patient Care Team: Ann Held, DO as PCP - [place], DPM as Consulting Physician (Podiatry) Tanda Rockers, MD as Consulting Physician (Pulmonary Disease)   Past Medical History:  Diagnosis Date  . Anxiety   . Asthma    on inhaler  . Depression   . GERD (gastroesophageal reflux disease)   . Heart murmur   . Hemorrhoids    in past  . Migraine   . Pneumonia    3 years ago/has bronchitis often  . UTI (urinary tract infection)    in past    Past Surgical History:  Procedure Laterality Date  . WISDOM TOOTH EXTRACTION      Family History  Problem Relation Age of Onset  . Lung cancer  Father   . Alcohol abuse Father   . Throat cancer Father   . Liver disease Maternal Aunt   . Breast cancer Maternal Aunt   . Dementia Maternal Aunt   . Hyperlipidemia Mother   . COPD Mother   . Hypertension Mother   . Colon polyps Mother   . Pancreatic cancer Brother     pancreas  . Cancer Paternal Uncle     type unbknown  . Other Brother     vein issues  . Other Sister   . Colon polyps Sister     Social History   Social History  . Marital status: Single    Spouse name: N/A  . Number of children: 0  . Years of education: N/A   Occupational History  . self employed-- [employer] History Main Topics  . Smoking status: Never Smoker  . Smokeless tobacco: Never Used  . Alcohol use No     Comment: Occ  . Drug use: No  . Sexual activity: Not Currently    Partners: Male   Other Topics Concern  . Not on file   Social History Narrative   Exercise--no    Outpatient Medications Prior to Visit  Medication Sig Dispense Refill  . acetaminophen (TYLENOL) 500 MG tablet Take 500 mg by  mouth as needed.    . beclomethasone (QVAR) 80 MCG/ACT inhaler Inhale 1 puff into the lungs daily.    . Biotin 1000 MCG tablet Take 1,000 mcg by mouth 3 (three) times daily.    . Calcium-Magnesium-Vitamin D [id number] MG-MG-UNIT CHEW Chew 1 capsule by mouth daily.    . citalopram (CELEXA) 20 MG tablet TAKE 1 TABLET BY MOUTH EVERY DAY 30 tablet 5  . Echinacea 125 MG CAPS Take by mouth.    . famotidine (PEPCID) 20 MG tablet One at bedtime 30 tablet 11  . fluticasone (FLONASE) 50 MCG/ACT nasal spray Place 2 sprays into both nostrils daily. 16 g 6  . Multiple Vitamin (MULTIVITAMIN) capsule Take 1 capsule by mouth daily.    . naproxen (NAPROSYN) 500 MG tablet Take 1 tablet (500 mg total) by mouth 2 (two) times daily with a meal. 180 tablet 3  . Omega-3 Fatty Acids (FISH OIL) 500 MG CAPS Take by mouth.    . pantoprazole (PROTONIX) 40 MG tablet TAKE 1 TABLET BY MOUTH EVERY  DAY 30-60 MINUTES BEFORE FIRST MEAL 90 tablet 1  . vitamin C (ASCORBIC ACID) 250 MG tablet Take 250 mg by mouth daily.    Marland Kitchen ALPRAZolam (XANAX) 0.25 MG tablet Take 1 tablet (0.25 mg total) by mouth 3 (three) times daily as needed for anxiety. 30 tablet 0  . amoxicillin-clavulanate (AUGMENTIN) 875-125 MG tablet Take 1 tablet by mouth 2 (two) times daily. 20 tablet 0  . benzonatate (TESSALON) 100 MG capsule Take 1 capsule (100 mg total) by mouth 3 (three) times daily as needed for cough. 21 capsule 0  . naproxen (NAPROSYN) 500 MG tablet TAKE 1 TABLET BY MOUTH TWICE DAILY WITH MEALS 180 tablet 0   No facility-administered medications prior to visit.     Allergies  Allergen Reactions  . Myrbetriq [Mirabegron] Itching    Review of Systems  HENT: Positive for ear pain and trouble swallowing. Negative for congestion, drooling, ear discharge and hoarse voice.   Respiratory: Negative for cough, shortness of breath and stridor.   Gastrointestinal: Negative for abdominal pain, diarrhea and vomiting.  Musculoskeletal: Negative for neck pain.  Neurological: Positive for headaches.       Objective:    Physical Exam  BP 128/70 (BP Location: Left Arm, Cuff Size: Large)   Pulse 81   Temp 98.5 F (36.9 C) (Oral)   Resp 16   Ht 5\' 7"  (1.702 m)   Wt 285 lb 3.2 oz (129.4 kg)   LMP 04/29/2014   SpO2 96%   BMI 44.67 kg/m  Wt Readings from Last 3 Encounters:  04/22/16 285 lb 3.2 oz (129.4 kg)  04/07/16 283 lb 2 oz (128.4 kg)  01/11/16 280 lb 6.4 oz (127.2 kg)   BP Readings from Last 3 Encounters:  04/22/16 128/70  04/07/16 (!) 136/96  01/11/16 131/73     Immunization History  Administered Date(s) Administered  . Influenza Whole 10/20/2007, 12/13/2009, 11/13/2012  . Influenza,inj,Quad PF,36+ Mos 10/26/2013  . Influenza-Unspecified 11/26/2011, 11/13/2013, 11/08/2014  . Pneumococcal Polysaccharide-23 10/26/2013  . Td 10/12/2002  . Tdap 07/21/2014    Health Maintenance  Topic Date Due   . Hepatitis C Screening  1960/05/26  . HIV Screening  01/06/1978  . INFLUENZA VACCINE  08/13/2016  . MAMMOGRAM  04/17/2017  . PAP SMEAR  07/20/2017  . TETANUS/TDAP  07/20/2024  . COLONOSCOPY  10/02/2024    Lab Results  Component Value Date   WBC 11.7 (H) 08/15/2014   HGB 13.2  08/15/2014   HCT 40.3 08/15/2014   PLT 323.0 08/15/2014   GLUCOSE 84 08/16/2015   CHOL 171 08/16/2015   TRIG 88.0 08/16/2015   HDL 43.60 08/16/2015   LDLDIRECT 144.8 05/19/2011   LDLCALC 110 (H) 08/16/2015   ALT 23 08/16/2015   AST 20 08/16/2015   NA 138 08/16/2015   K 4.7 08/16/2015   CL 104 08/16/2015   CREATININE 0.78 08/16/2015   BUN 27 (H) 08/16/2015   CO2 29 08/16/2015   TSH 1.93 07/25/2014    Lab Results  Component Value Date   TSH 1.93 07/25/2014   Lab Results  Component Value Date   WBC 11.7 (H) 08/15/2014   HGB 13.2 08/15/2014   HCT 40.3 08/15/2014   MCV 82.3 08/15/2014   PLT 323.0 08/15/2014   Lab Results  Component Value Date   NA 138 08/16/2015   K 4.7 08/16/2015   CO2 29 08/16/2015   GLUCOSE 84 08/16/2015   BUN 27 (H) 08/16/2015   CREATININE 0.78 08/16/2015   BILITOT 0.3 08/16/2015   ALKPHOS 84 08/16/2015   AST 20 08/16/2015   ALT 23 08/16/2015   PROT 7.1 08/16/2015   ALBUMIN 4.0 08/16/2015   CALCIUM 9.9 08/16/2015   GFR 82.24 08/16/2015   Lab Results  Component Value Date   CHOL 171 08/16/2015   Lab Results  Component Value Date   HDL 43.60 08/16/2015   Lab Results  Component Value Date   LDLCALC 110 (H) 08/16/2015   Lab Results  Component Value Date   TRIG 88.0 08/16/2015   Lab Results  Component Value Date   CHOLHDL 4 08/16/2015   No results found for: HGBA1C       Assessment & Plan:   Problem List Items Addressed This Visit    None    Visit Diagnoses    Sore throat    -  Primary   Relevant Orders   POCT rapid strep A   Strep throat       Relevant Medications   amoxicillin (AMOXIL) 875 MG tablet   Generalized anxiety disorder         Relevant Medications   ALPRAZolam (XANAX) 0.25 MG tablet    increase fluids Rest Tylenol or advil prn   I have discontinued Ms. Alexa's amoxicillin-clavulanate and benzonatate. I am also having her start on amoxicillin. Additionally, I am having her maintain her Calcium-Magnesium-Vitamin D, famotidine, multivitamin, beclomethasone, fluticasone, Fish Oil, acetaminophen, Echinacea, vitamin C, Biotin, citalopram, naproxen, pantoprazole, and ALPRAZolam.  Meds ordered this encounter  Medications  . amoxicillin (AMOXIL) 875 MG tablet    Sig: Take 1 tablet (875 mg total) by mouth 2 (two) times daily. For 10 days.    Dispense:  20 tablet    Refill:  0  . ALPRAZolam (XANAX) 0.25 MG tablet    Sig: Take 1 tablet (0.25 mg total) by mouth 3 (three) times daily as needed for anxiety.    Dispense:  30 tablet    Refill:  0     Ann Held, DO

## 2016-05-03 ENCOUNTER — Other Ambulatory Visit: Payer: Self-pay | Admitting: Family Medicine

## 2016-05-04 ENCOUNTER — Encounter: Payer: Self-pay | Admitting: Family Medicine

## 2016-05-05 LAB — POCT RAPID STREP A (OFFICE): RAPID STREP A SCREEN: NEGATIVE

## 2016-05-07 ENCOUNTER — Telehealth: Payer: Self-pay

## 2016-05-07 NOTE — Telephone Encounter (Signed)
PA renewal for Saxenda initiated via Covermymeds; KEY: [id number]. Awaiting determination.

## 2016-05-07 NOTE — Telephone Encounter (Signed)
Spoke w/ Sam from [place], [place] cancelled (med no longer on medication list).

## 2016-05-08 ENCOUNTER — Ambulatory Visit: Payer: BLUE CROSS/BLUE SHIELD | Admitting: Registered"

## 2016-05-15 ENCOUNTER — Encounter: Payer: BLUE CROSS/BLUE SHIELD | Attending: General Surgery | Admitting: Registered"

## 2016-05-15 ENCOUNTER — Encounter: Payer: Self-pay | Admitting: Registered"

## 2016-05-15 DIAGNOSIS — Z713 Dietary counseling and surveillance: Secondary | ICD-10-CM | POA: Diagnosis not present

## 2016-05-15 DIAGNOSIS — Z6841 Body Mass Index (BMI) 40.0 and over, adult: Secondary | ICD-10-CM | POA: Diagnosis not present

## 2016-05-15 DIAGNOSIS — E669 Obesity, unspecified: Secondary | ICD-10-CM

## 2016-05-15 NOTE — Progress Notes (Signed)
Pre-Op Assessment Visit:  Pre-Operative RYGB Surgery  Medical Nutrition Therapy:  Appt start time: 9:10  End time:  10:15  Patient was seen on 05/15/2016 for Pre-Operative Nutrition Assessment. Assessment and letter of approval faxed to [facility] Bariatric Surgery Program coordinator on 05/15/2016.   Pt expectation of surgery: increase stamina and energy, improve breathing  Pt expectation of Dietitian: help with reading labels, what to eat, and portion control  Start weight at [facility]: 289.9 BMI: 45.40   Pt states she started having breathing problems 3 yrs ago and has gained about 40 pounds since then. Pt reports she has given up sodas mostly and only drinks them when out and has forgotten her water bottle. Pt states she drinks alcohol socially (about 3-4 times a year) to the point of getting drunk.  Pt states she thinks she needs 1 nutrition visit with [place] and her visits from last year will also count, but unsure. Pt will call insurance company to verify details to proceed forward with process. Pt states she started bariatric surgery process in 2017 but was denied surgery due to not having enough visits.    24 hr Dietary Recall: First Meal: 2 Protein shakes Snack: none Second Meal: salad Snack: protein shake, protein bar, sometimes [place], ice cream Third Meal: Baked chicken, hamburgers, butternut squash, stir fry vegetables, broccoli casserole Snack: crackers Beverages: water, soda (rarely), sweet tea  Encouraged to engage in 150 minutes of moderate physical activity including cardiovascular and weight baring weekly  Handouts given during visit include:  . Pre-Op Goals . Bariatric Surgery Protein Shakes . Vitamins and Minerals Supplements  During the appointment today the following Pre-Op Goals were reviewed with the patient: . Maintain or lose weight as instructed by your surgeon . Make healthy food choices . Begin to limit portion sizes . Limited concentrated  sugars and fried foods . Keep fat/sugar in the single digits per serving on          food labels . Practice CHEWING your food  (aim for 30 chews per bite or until applesauce consistency) . Practice not drinking 15 minutes before, during, and 30 minutes after each meal/snack . Avoid all carbonated beverages  . Avoid/limit caffeinated beverages  . Avoid all sugar-sweetened beverages . Consume 3 meals per day; eat every 3-5 hours . Make a list of non-food related activities . Aim for 64-100 ounces of FLUID daily  . Aim for at least 60-80 grams of PROTEIN daily . Look for a liquid protein source that contain ?15 g protein and ?5 g carbohydrate  (ex: shakes, drinks, shots) - Physical activity   Follow diet recommendations listed below  Energy and Macronutrient Recomendations: Calories: 1600 Carbohydrate: 180 Protein: 120 Fat: 44  Demonstrated degree of understanding via:  Teach Back   Teaching Method Utilized:  Visual Auditory Hands on  Barriers to learning/adherence to lifestyle change: none  Patient to call the [facility] to enroll in Pre-Op and Post-Op Nutrition Education when surgery date is scheduled.

## 2016-05-22 DIAGNOSIS — Z6841 Body Mass Index (BMI) 40.0 and over, adult: Secondary | ICD-10-CM | POA: Diagnosis not present

## 2016-06-05 NOTE — Progress Notes (Signed)
Need orders for 6-19 surgery in epic

## 2016-06-11 ENCOUNTER — Other Ambulatory Visit: Payer: Self-pay | Admitting: Family Medicine

## 2016-06-13 ENCOUNTER — Ambulatory Visit: Payer: Self-pay | Admitting: General Surgery

## 2016-06-13 DIAGNOSIS — Z6841 Body Mass Index (BMI) 40.0 and over, adult: Secondary | ICD-10-CM | POA: Diagnosis not present

## 2016-06-16 ENCOUNTER — Encounter: Payer: BLUE CROSS/BLUE SHIELD | Attending: General Surgery | Admitting: Skilled Nursing Facility1

## 2016-06-16 DIAGNOSIS — Z6841 Body Mass Index (BMI) 40.0 and over, adult: Secondary | ICD-10-CM | POA: Insufficient documentation

## 2016-06-16 DIAGNOSIS — Z713 Dietary counseling and surveillance: Secondary | ICD-10-CM | POA: Insufficient documentation

## 2016-06-17 ENCOUNTER — Encounter: Payer: Self-pay | Admitting: Skilled Nursing Facility1

## 2016-06-17 NOTE — Progress Notes (Signed)
  Pre-Operative Nutrition Class:  Appt start time: 2867   End time:  1830.  Patient was seen on 06/17/2016 for Pre-Operative Bariatric Surgery Education [facility].   Surgery date: 06/30/2016 Surgery type: RYGB Start weight at [facility]: 289.8 Weight today: 297.6   TANITA  BODY COMP RESULTS     BMI (kg/m^2)    Fat Mass (lbs)    Fat Free Mass (lbs)    Total Body Water (lbs)    Samples given per MNT protocol. Patient educated on appropriate usage: Opurity Multivitamin Lot # [id number] Exp: 04/19  celebrate Calcium Citrate Lot # [id number] Exp: 04/2017  premierProtein shake Lot # [id number] Exp: 27/nov/2018   The following the learning objectives were met by the patient during this course:  Identify Pre-Op Dietary Goals and will begin 2 weeks pre-operatively  Identify appropriate sources of fluids and proteins   State protein recommendations and appropriate sources pre and post-operatively  Identify Post-Operative Dietary Goals and will follow for 2 weeks post-operatively  Identify appropriate multivitamin and calcium sources  Describe the need for physical activity post-operatively and will follow MD recommendations  State when to call healthcare provider regarding medication questions or post-operative complications  Handouts given during class include:  Pre-Op Bariatric Surgery Diet Handout  Protein Shake Handout  Post-Op Bariatric Surgery Nutrition Handout  [facility] Program Information Flyer  Support Group Information Flyer  [facility] Bariatric Supplements Price List  Follow-Up Plan: Patient will follow-up at [facility] 2 weeks post operatively for diet advancement per MD.

## 2016-06-18 DIAGNOSIS — Z472 Encounter for removal of internal fixation device: Secondary | ICD-10-CM | POA: Diagnosis not present

## 2016-06-18 DIAGNOSIS — M79644 Pain in right finger(s): Secondary | ICD-10-CM | POA: Diagnosis not present

## 2016-06-18 DIAGNOSIS — Z9889 Other specified postprocedural states: Secondary | ICD-10-CM | POA: Diagnosis not present

## 2016-06-23 NOTE — Patient Instructions (Addendum)
Caroline Ward  06/23/2016   Your procedure is scheduled on: 06-30-16   Report to [facility] Main  Entrance Take [place]  Elevators to 3rd floor to [place] at  5:15 AM.    Call this number if you have problems the morning of surgery [phone]    Remember: ONLY 1 PERSON MAY GO WITH YOU TO SHORT STAY TO GET  READY MORNING OF [place].  Do not eat food or drink liquids :After Midnight.     Take these medicines the morning of surgery with A SIP OF WATER: Celexa (Citalopram), and Pantoprazole (Protonix)                               You may not have any metal on your body including hair pins and              piercings  Do not wear jewelry, make-up, lotions, powders or perfumes, deodorant             Do not wear nail polish.  Do not shave  48 hours prior to surgery.                 Do not bring valuables to the hospital. [place].  Contacts, dentures or bridgework may not be worn into surgery.  Leave suitcase in the car. After surgery it may be brought to your room.                 Please read over the following fact sheets you were given: _____________________________________________________________________             [facility] - Preparing for Surgery Before surgery, you can play an important role.  Because skin is not sterile, your skin needs to be as free of germs as possible.  You can reduce the number of germs on your skin by washing with CHG (chlorahexidine gluconate) soap before surgery.  CHG is an antiseptic cleaner which kills germs and bonds with the skin to continue killing germs even after washing. Please DO NOT use if you have an allergy to CHG or antibacterial soaps.  If your skin becomes reddened/irritated stop using the CHG and inform your nurse when you arrive at Short Stay. Do not shave (including legs and underarms) for at least 48 hours prior to the first CHG shower.  You may  shave your face/neck. Please follow these instructions carefully:  1.  Shower with CHG Soap the night before surgery and the  morning of Surgery.  2.  If you choose to wash your hair, wash your hair first as usual with your  normal  shampoo.  3.  After you shampoo, rinse your hair and body thoroughly to remove the  shampoo.                           4.  Use CHG as you would any other liquid soap.  You can apply chg directly  to the skin and wash                       Gently with a scrungie or clean washcloth.  5.  Apply the CHG  Soap to your body ONLY FROM THE NECK DOWN.   Do not use on face/ open                           Wound or open sores. Avoid contact with eyes, ears mouth and genitals (private parts).                       Wash face,  Genitals (private parts) with your normal soap.             6.  Wash thoroughly, paying special attention to the area where your surgery  will be performed.  7.  Thoroughly rinse your body with warm water from the neck down.  8.  DO NOT shower/wash with your normal soap after using and rinsing off  the CHG Soap.                9.  Pat yourself dry with a clean towel.            10.  Wear clean pajamas.            11.  Place clean sheets on your bed the night of your first shower and do not  sleep with pets. Day of Surgery : Do not apply any lotions/deodorants the morning of surgery.  Please wear clean clothes to the hospital/surgery center.  FAILURE TO FOLLOW THESE INSTRUCTIONS MAY RESULT IN THE CANCELLATION OF YOUR SURGERY PATIENT SIGNATURE_________________________________  NURSE SIGNATURE__________________________________  ________________________________________________________________________

## 2016-06-24 ENCOUNTER — Encounter (HOSPITAL_COMMUNITY)
Admission: RE | Admit: 2016-06-24 | Discharge: 2016-06-24 | Disposition: A | Discharge status: Home / Self Care | Payer: BLUE CROSS/BLUE SHIELD | Source: Ambulatory Visit | Attending: General Surgery | Admitting: General Surgery

## 2016-06-24 ENCOUNTER — Encounter (HOSPITAL_COMMUNITY): Payer: Self-pay

## 2016-06-24 DIAGNOSIS — Z6841 Body Mass Index (BMI) 40.0 and over, adult: Secondary | ICD-10-CM | POA: Insufficient documentation

## 2016-06-24 DIAGNOSIS — Z01818 Encounter for other preprocedural examination: Secondary | ICD-10-CM | POA: Insufficient documentation

## 2016-06-24 DIAGNOSIS — Z01812 Encounter for preprocedural laboratory examination: Secondary | ICD-10-CM | POA: Insufficient documentation

## 2016-06-24 HISTORY — DX: Adverse effect of unspecified anesthetic, initial encounter: T41.45XA

## 2016-06-24 HISTORY — DX: Other complications of anesthesia, initial encounter: T88.59XA

## 2016-06-24 LAB — COMPREHENSIVE METABOLIC PANEL
ALBUMIN: 4.4 g/dL (ref 3.5–5.0)
ALT: 33 U/L (ref 14–54)
ANION GAP: 10 (ref 5–15)
AST: 32 U/L (ref 15–41)
Alkaline Phosphatase: 81 U/L (ref 38–126)
BUN: 24 mg/dL — AB (ref 6–20)
CHLORIDE: 102 mmol/L (ref 101–111)
CO2: 27 mmol/L (ref 22–32)
Calcium: 9.4 mg/dL (ref 8.9–10.3)
Creatinine, Ser: 0.78 mg/dL (ref 0.44–1.00)
GFR calc Af Amer: >60 mL/min (ref >60)
GLUCOSE: 89 mg/dL (ref 65–99)
POTASSIUM: 4 mmol/L (ref 3.5–5.1)
Sodium: 139 mmol/L (ref 135–145)
Total Bilirubin: 0.7 mg/dL (ref 0.3–1.2)
Total Protein: 7.9 g/dL (ref 6.5–8.1)

## 2016-06-24 LAB — CBC WITH DIFFERENTIAL/PLATELET
BASOS ABS: 0 10*3/uL (ref 0.0–0.1)
BASOS PCT: 0 %
EOS PCT: 2 %
Eosinophils Absolute: 0.2 10*3/uL (ref 0.0–0.7)
HCT: 41.1 % (ref 36.0–46.0)
Hemoglobin: 13.6 g/dL (ref 12.0–15.0)
Lymphocytes Relative: 13 %
Lymphs Abs: 1.5 10*3/uL (ref 0.7–4.0)
MCH: 27.4 pg (ref 26.0–34.0)
MCHC: 33.1 g/dL (ref 30.0–36.0)
MCV: 82.7 fL (ref 78.0–100.0)
MONO ABS: 0.7 10*3/uL (ref 0.1–1.0)
Monocytes Relative: 7 %
Neutro Abs: 9 10*3/uL — ABNORMAL HIGH (ref 1.7–7.7)
Neutrophils Relative %: 78 %
PLATELETS: 311 10*3/uL (ref 150–400)
RBC: 4.97 MIL/uL (ref 3.87–5.11)
RDW: 13.8 % (ref 11.5–15.5)
WBC: 11.4 10*3/uL — ABNORMAL HIGH (ref 4.0–10.5)

## 2016-06-30 ENCOUNTER — Encounter (HOSPITAL_COMMUNITY): Payer: Self-pay | Admitting: *Deleted

## 2016-06-30 ENCOUNTER — Inpatient Hospital Stay (HOSPITAL_COMMUNITY): Payer: BLUE CROSS/BLUE SHIELD | Admitting: Certified Registered Nurse Anesthetist

## 2016-06-30 ENCOUNTER — Inpatient Hospital Stay (HOSPITAL_COMMUNITY)
Admission: RE | Admit: 2016-06-30 | Discharge: 2016-07-02 | DRG: 621 | Disposition: A | Discharge status: Home / Self Care | Payer: BLUE CROSS/BLUE SHIELD | Source: Ambulatory Visit | Attending: General Surgery | Admitting: General Surgery

## 2016-06-30 ENCOUNTER — Encounter (HOSPITAL_COMMUNITY)
Admission: RE | Disposition: A | Discharge status: Home / Self Care | Payer: Self-pay | Source: Ambulatory Visit | Attending: General Surgery

## 2016-06-30 DIAGNOSIS — R32 Unspecified urinary incontinence: Secondary | ICD-10-CM | POA: Diagnosis not present

## 2016-06-30 DIAGNOSIS — G8929 Other chronic pain: Secondary | ICD-10-CM | POA: Diagnosis present

## 2016-06-30 DIAGNOSIS — D72829 Elevated white blood cell count, unspecified: Secondary | ICD-10-CM | POA: Diagnosis not present

## 2016-06-30 DIAGNOSIS — K219 Gastro-esophageal reflux disease without esophagitis: Secondary | ICD-10-CM | POA: Diagnosis not present

## 2016-06-30 DIAGNOSIS — Z823 Family history of stroke: Secondary | ICD-10-CM

## 2016-06-30 DIAGNOSIS — Z8249 Family history of ischemic heart disease and other diseases of the circulatory system: Secondary | ICD-10-CM | POA: Diagnosis not present

## 2016-06-30 DIAGNOSIS — Z818 Family history of other mental and behavioral disorders: Secondary | ICD-10-CM | POA: Diagnosis not present

## 2016-06-30 DIAGNOSIS — Z6841 Body Mass Index (BMI) 40.0 and over, adult: Secondary | ICD-10-CM

## 2016-06-30 DIAGNOSIS — Z833 Family history of diabetes mellitus: Secondary | ICD-10-CM | POA: Diagnosis not present

## 2016-06-30 DIAGNOSIS — I10 Essential (primary) hypertension: Secondary | ICD-10-CM | POA: Diagnosis not present

## 2016-06-30 DIAGNOSIS — J45909 Unspecified asthma, uncomplicated: Secondary | ICD-10-CM | POA: Diagnosis present

## 2016-06-30 DIAGNOSIS — F418 Other specified anxiety disorders: Secondary | ICD-10-CM | POA: Diagnosis not present

## 2016-06-30 HISTORY — PX: GASTRIC ROUX-EN-Y: SHX5262

## 2016-06-30 LAB — HEMOGLOBIN AND HEMATOCRIT, BLOOD
HCT: 41.3 % (ref 36.0–46.0)
Hemoglobin: 14.1 g/dL (ref 12.0–15.0)

## 2016-06-30 LAB — PREGNANCY, URINE: PREG TEST UR: NEGATIVE

## 2016-06-30 SURGERY — LAPAROSCOPIC ROUX-EN-Y GASTRIC BYPASS WITH UPPER ENDOSCOPY
Anesthesia: General | Site: Abdomen

## 2016-06-30 MED ORDER — MORPHINE SULFATE (PF) 10 MG/ML IV SOLN: 1.0000 mg | INTRAVENOUS | Status: DC | PRN

## 2016-06-30 MED ORDER — ACETAMINOPHEN 500 MG PO TABS
1000.0000 mg | ORAL_TABLET | ORAL | Status: AC
  Administered 2016-06-30: 1000 mg via ORAL
  Filled 2016-06-30: qty 2

## 2016-06-30 MED ORDER — PROPOFOL 10 MG/ML IV BOLUS
INTRAVENOUS | Status: AC
  Filled 2016-06-30: qty 20

## 2016-06-30 MED ORDER — SODIUM CHLORIDE 0.9 % IJ SOLN
INTRAMUSCULAR | Status: AC
  Filled 2016-06-30: qty 10

## 2016-06-30 MED ORDER — MIDAZOLAM HCL 5 MG/5ML IJ SOLN
INTRAMUSCULAR | Status: DC | PRN
  Administered 2016-06-30: 2 mg via INTRAVENOUS

## 2016-06-30 MED ORDER — ONDANSETRON HCL 4 MG/2ML IJ SOLN: 4.0000 mg | INTRAMUSCULAR | Status: DC | PRN

## 2016-06-30 MED ORDER — PROPOFOL 10 MG/ML IV BOLUS
INTRAVENOUS | Status: DC | PRN
  Administered 2016-06-30: 180 mg via INTRAVENOUS

## 2016-06-30 MED ORDER — METOCLOPRAMIDE HCL 5 MG/ML IJ SOLN
INTRAMUSCULAR | Status: DC | PRN
  Administered 2016-06-30: 10 mg via INTRAVENOUS

## 2016-06-30 MED ORDER — SODIUM CHLORIDE 0.9 % IV SOLN: 20.0000 mL | Freq: Once | INTRAVENOUS | Status: DC

## 2016-06-30 MED ORDER — BUPIVACAINE-EPINEPHRINE 0.25% -1:200000 IJ SOLN
INTRAMUSCULAR | Status: DC | PRN
  Administered 2016-06-30: 24 mL

## 2016-06-30 MED ORDER — ONDANSETRON HCL 4 MG/2ML IJ SOLN
INTRAMUSCULAR | Status: DC | PRN
  Administered 2016-06-30: 4 mg via INTRAVENOUS

## 2016-06-30 MED ORDER — ROCURONIUM BROMIDE 50 MG/5ML IV SOSY
PREFILLED_SYRINGE | INTRAVENOUS | Status: DC | PRN
  Administered 2016-06-30: 20 mg via INTRAVENOUS
  Administered 2016-06-30: 50 mg via INTRAVENOUS
  Administered 2016-06-30: 20 mg via INTRAVENOUS

## 2016-06-30 MED ORDER — LIDOCAINE 2% (20 MG/ML) 5 ML SYRINGE
INTRAMUSCULAR | Status: AC
  Filled 2016-06-30: qty 5

## 2016-06-30 MED ORDER — PREMIER PROTEIN SHAKE
2.0000 [oz_av] | ORAL | Status: DC
  Administered 2016-07-01 – 2016-07-02 (×9): 2 [oz_av] via ORAL

## 2016-06-30 MED ORDER — EPHEDRINE 5 MG/ML INJ
INTRAVENOUS | Status: AC
  Filled 2016-06-30: qty 10

## 2016-06-30 MED ORDER — FENTANYL CITRATE (PF) 250 MCG/5ML IJ SOLN
INTRAMUSCULAR | Status: AC
  Filled 2016-06-30: qty 5

## 2016-06-30 MED ORDER — LIDOCAINE 2% (20 MG/ML) 5 ML SYRINGE
INTRAMUSCULAR | Status: DC | PRN
  Administered 2016-06-30: 60 mg via INTRAVENOUS

## 2016-06-30 MED ORDER — EPHEDRINE SULFATE-NACL 50-0.9 MG/10ML-% IV SOSY
PREFILLED_SYRINGE | INTRAVENOUS | Status: DC | PRN
  Administered 2016-06-30: 10 mg via INTRAVENOUS

## 2016-06-30 MED ORDER — CHLORHEXIDINE GLUCONATE 0.12 % MT SOLN
15.0000 mL | Freq: Two times a day (BID) | OROMUCOSAL | Status: DC
  Administered 2016-06-30 – 2016-07-01 (×3): 15 mL via OROMUCOSAL
  Filled 2016-06-30 (×4): qty 15

## 2016-06-30 MED ORDER — APREPITANT 40 MG PO CAPS
40.0000 mg | ORAL_CAPSULE | ORAL | Status: AC
  Administered 2016-06-30: 40 mg via ORAL
  Filled 2016-06-30: qty 1

## 2016-06-30 MED ORDER — DEXAMETHASONE SODIUM PHOSPHATE 10 MG/ML IJ SOLN
INTRAMUSCULAR | Status: AC
  Filled 2016-06-30: qty 1

## 2016-06-30 MED ORDER — LACTATED RINGERS IV SOLN: INTRAVENOUS | Status: DC

## 2016-06-30 MED ORDER — METOCLOPRAMIDE HCL 5 MG/ML IJ SOLN
INTRAMUSCULAR | Status: AC
  Filled 2016-06-30: qty 2

## 2016-06-30 MED ORDER — SODIUM CHLORIDE 0.9 % IJ SOLN
INTRAMUSCULAR | Status: AC
  Filled 2016-06-30: qty 50

## 2016-06-30 MED ORDER — HEPARIN SODIUM (PORCINE) 5000 UNIT/ML IJ SOLN
5000.0000 [IU] | INTRAMUSCULAR | Status: DC
  Filled 2016-06-30: qty 1

## 2016-06-30 MED ORDER — LIDOCAINE 2% (20 MG/ML) 5 ML SYRINGE
INTRAMUSCULAR | Status: DC | PRN
  Administered 2016-06-30: 1.5 mg/kg/h via INTRAVENOUS

## 2016-06-30 MED ORDER — HYDROMORPHONE HCL 1 MG/ML IJ SOLN
INTRAMUSCULAR | Status: AC
  Filled 2016-06-30: qty 0.5

## 2016-06-30 MED ORDER — HYDROMORPHONE HCL 1 MG/ML IJ SOLN
0.2500 mg | INTRAMUSCULAR | Status: DC | PRN
  Administered 2016-06-30: 0.5 mg via INTRAVENOUS

## 2016-06-30 MED ORDER — PHENYLEPHRINE 40 MCG/ML (10ML) SYRINGE FOR IV PUSH (FOR BLOOD PRESSURE SUPPORT)
PREFILLED_SYRINGE | INTRAVENOUS | Status: AC
  Filled 2016-06-30: qty 10

## 2016-06-30 MED ORDER — BUPIVACAINE-EPINEPHRINE (PF) 0.25% -1:200000 IJ SOLN
INTRAMUSCULAR | Status: AC
  Filled 2016-06-30: qty 30

## 2016-06-30 MED ORDER — GABAPENTIN 300 MG PO CAPS
300.0000 mg | ORAL_CAPSULE | ORAL | Status: AC
  Administered 2016-06-30: 300 mg via ORAL
  Filled 2016-06-30: qty 1

## 2016-06-30 MED ORDER — SCOPOLAMINE 1 MG/3DAYS TD PT72
1.0000 | MEDICATED_PATCH | TRANSDERMAL | Status: DC
  Administered 2016-06-30: 1.5 mg via TRANSDERMAL
  Filled 2016-06-30: qty 1

## 2016-06-30 MED ORDER — ENOXAPARIN SODIUM 30 MG/0.3ML ~~LOC~~ SOLN
30.0000 mg | Freq: Two times a day (BID) | SUBCUTANEOUS | Status: DC
Start: 2016-06-30 — End: 2016-07-02
  Administered 2016-06-30 – 2016-07-02 (×4): 30 mg via SUBCUTANEOUS
  Filled 2016-06-30 (×4): qty 0.3

## 2016-06-30 MED ORDER — LACTATED RINGERS IV SOLN
INTRAVENOUS | Status: DC | PRN
  Administered 2016-06-30 (×2): via INTRAVENOUS

## 2016-06-30 MED ORDER — 0.9 % SODIUM CHLORIDE (POUR BTL) OPTIME
TOPICAL | Status: DC | PRN
  Administered 2016-06-30: 1000 mL

## 2016-06-30 MED ORDER — POTASSIUM CHLORIDE IN NACL 20-0.9 MEQ/L-% IV SOLN
INTRAVENOUS | Status: DC
  Administered 2016-06-30: 1000 mL via INTRAVENOUS
  Administered 2016-06-30 – 2016-07-01 (×3): via INTRAVENOUS
  Administered 2016-07-01: 1000 mL via INTRAVENOUS
  Filled 2016-06-30 (×7): qty 1000

## 2016-06-30 MED ORDER — DEXAMETHASONE SODIUM PHOSPHATE 4 MG/ML IJ SOLN
4.0000 mg | INTRAMUSCULAR | Status: AC
  Administered 2016-06-30: 10 mg via INTRAVENOUS

## 2016-06-30 MED ORDER — SUGAMMADEX SODIUM 200 MG/2ML IV SOLN
INTRAVENOUS | Status: DC | PRN
  Administered 2016-06-30: 300 mg via INTRAVENOUS

## 2016-06-30 MED ORDER — DIPHENHYDRAMINE HCL 50 MG/ML IJ SOLN
INTRAMUSCULAR | Status: DC | PRN
  Administered 2016-06-30: 12.5 mg via INTRAVENOUS

## 2016-06-30 MED ORDER — BUPIVACAINE LIPOSOME 1.3 % IJ SUSP
INTRAMUSCULAR | Status: DC | PRN
  Administered 2016-06-30: 20 mL

## 2016-06-30 MED ORDER — LIDOCAINE 2% (20 MG/ML) 5 ML SYRINGE
INTRAMUSCULAR | Status: AC
  Filled 2016-06-30: qty 10

## 2016-06-30 MED ORDER — BUPIVACAINE LIPOSOME 1.3 % IJ SUSP
20.0000 mL | Freq: Once | INTRAMUSCULAR | Status: DC
  Filled 2016-06-30: qty 20

## 2016-06-30 MED ORDER — PANTOPRAZOLE SODIUM 40 MG IV SOLR
40.0000 mg | Freq: Every day | INTRAVENOUS | Status: DC
  Administered 2016-06-30 – 2016-07-01 (×2): 40 mg via INTRAVENOUS
  Filled 2016-06-30 (×2): qty 40

## 2016-06-30 MED ORDER — EVICEL 5 ML EX KIT
PACK | Freq: Once | CUTANEOUS | Status: DC
  Filled 2016-06-30: qty 1

## 2016-06-30 MED ORDER — ACETAMINOPHEN 160 MG/5ML PO SOLN: 325.0000 mg | ORAL | Status: DC | PRN

## 2016-06-30 MED ORDER — ORAL CARE MOUTH RINSE
15.0000 mL | Freq: Two times a day (BID) | OROMUCOSAL | Status: DC
  Administered 2016-06-30 – 2016-07-01 (×2): 15 mL via OROMUCOSAL

## 2016-06-30 MED ORDER — LACTATED RINGERS IR SOLN
Status: DC | PRN
  Administered 2016-06-30: 1000 mL

## 2016-06-30 MED ORDER — ACETAMINOPHEN 325 MG PO TABS: 650.0000 mg | ORAL_TABLET | ORAL | Status: DC | PRN

## 2016-06-30 MED ORDER — PHENYLEPHRINE 40 MCG/ML (10ML) SYRINGE FOR IV PUSH (FOR BLOOD PRESSURE SUPPORT)
PREFILLED_SYRINGE | INTRAVENOUS | Status: DC | PRN
  Administered 2016-06-30 (×2): 80 ug via INTRAVENOUS
  Administered 2016-06-30: 120 ug via INTRAVENOUS
  Administered 2016-06-30: 40 ug via INTRAVENOUS

## 2016-06-30 MED ORDER — DIPHENHYDRAMINE HCL 50 MG/ML IJ SOLN
INTRAMUSCULAR | Status: AC
  Filled 2016-06-30: qty 1

## 2016-06-30 MED ORDER — PROMETHAZINE HCL 25 MG/ML IJ SOLN: 6.2500 mg | INTRAMUSCULAR | Status: DC | PRN

## 2016-06-30 MED ORDER — CEFOTETAN DISODIUM-DEXTROSE 2-2.08 GM-% IV SOLR
2.0000 g | INTRAVENOUS | Status: AC
  Administered 2016-06-30: 2 g via INTRAVENOUS
  Filled 2016-06-30: qty 50

## 2016-06-30 MED ORDER — FENTANYL CITRATE (PF) 100 MCG/2ML IJ SOLN
INTRAMUSCULAR | Status: DC | PRN
  Administered 2016-06-30 (×5): 50 ug via INTRAVENOUS

## 2016-06-30 MED ORDER — ROCURONIUM BROMIDE 50 MG/5ML IV SOSY
PREFILLED_SYRINGE | INTRAVENOUS | Status: AC
  Filled 2016-06-30: qty 10

## 2016-06-30 MED ORDER — ONDANSETRON HCL 4 MG/2ML IJ SOLN
INTRAMUSCULAR | Status: AC
  Filled 2016-06-30: qty 2

## 2016-06-30 MED ORDER — MIDAZOLAM HCL 2 MG/2ML IJ SOLN
INTRAMUSCULAR | Status: AC
  Filled 2016-06-30: qty 2

## 2016-06-30 MED ORDER — MEPERIDINE HCL 50 MG/ML IJ SOLN: 6.2500 mg | INTRAMUSCULAR | Status: DC | PRN

## 2016-06-30 MED ORDER — OXYCODONE HCL 5 MG/5ML PO SOLN
5.0000 mg | ORAL | Status: DC | PRN
  Administered 2016-06-30 (×2): 5 mg via ORAL
  Filled 2016-06-30 (×2): qty 5

## 2016-06-30 MED ORDER — SUGAMMADEX SODIUM 500 MG/5ML IV SOLN
INTRAVENOUS | Status: AC
  Filled 2016-06-30: qty 5

## 2016-06-30 MED ORDER — SODIUM CHLORIDE 0.9 % IJ SOLN
INTRAMUSCULAR | Status: DC | PRN
  Administered 2016-06-30: 50 mL

## 2016-06-30 SURGICAL SUPPLY — 85 items
ADH SKN CLS APL DERMABOND .7 (GAUZE/BANDAGES/DRESSINGS) ×1
APPLICATOR COTTON TIP 6IN STRL (MISCELLANEOUS) ×4 IMPLANT
APPLIER CLIP ROT 10 11.4 M/L (STAPLE)
APPLIER CLIP ROT 13.4 12 LRG (CLIP)
APR CLP LRG 13.4X12 ROT 20 MLT (CLIP)
APR CLP MED LRG 11.4X10 (STAPLE)
BAG SPEC RTRVL LRG 6X4 10 (ENDOMECHANICALS)
BLADE SURG SZ11 CARB STEEL (BLADE) ×2 IMPLANT
CABLE HIGH FREQUENCY MONO STRZ (ELECTRODE) ×2 IMPLANT
CHLORAPREP W/TINT 26ML (MISCELLANEOUS) ×3 IMPLANT
CLIP APPLIE ROT 10 11.4 M/L (STAPLE) IMPLANT
CLIP APPLIE ROT 13.4 12 LRG (CLIP) IMPLANT
CLIP SUT LAPRA TY ABSORB (SUTURE) ×4 IMPLANT
CUTTER FLEX LINEAR 45M (STAPLE) ×2 IMPLANT
DECANTER SPIKE VIAL GLASS SM (MISCELLANEOUS) ×2 IMPLANT
DERMABOND ADVANCED (GAUZE/BANDAGES/DRESSINGS) ×1
DERMABOND ADVANCED .7 DNX12 (GAUZE/BANDAGES/DRESSINGS) ×1 IMPLANT
DEVICE SUT QUICK LOAD TK 5 (STAPLE) IMPLANT
DEVICE SUT TI-KNOT TK 5X26 (MISCELLANEOUS) IMPLANT
DEVICE SUTURE ENDOST 10MM (ENDOMECHANICALS) IMPLANT
DISSECTOR BLUNT TIP ENDO 5MM (MISCELLANEOUS) IMPLANT
DRAIN PENROSE 18X1/4 LTX STRL (WOUND CARE) ×2 IMPLANT
ELECT REM PT RETURN 15FT ADLT (MISCELLANEOUS) ×2 IMPLANT
GAUZE SPONGE 4X4 12PLY STRL (GAUZE/BANDAGES/DRESSINGS) ×2 IMPLANT
GAUZE SPONGE 4X4 16PLY XRAY LF (GAUZE/BANDAGES/DRESSINGS) ×2 IMPLANT
GLOVE BIOGEL PI IND STRL 7.5 (GLOVE) ×1 IMPLANT
GLOVE BIOGEL PI INDICATOR 7.5 (GLOVE) ×1
GLOVE ECLIPSE 7.5 STRL STRAW (GLOVE) ×2 IMPLANT
GOWN STRL REUS W/TWL XL LVL3 (GOWN DISPOSABLE) ×4 IMPLANT
HEMOSTAT SURGICEL 4X8 (HEMOSTASIS) IMPLANT
HOVERMATT SINGLE USE (MISCELLANEOUS) ×2 IMPLANT
IRRIG SUCT STRYKERFLOW 2 WTIP (MISCELLANEOUS) ×2
IRRIGATION SUCT STRKRFLW 2 WTP (MISCELLANEOUS) ×1 IMPLANT
KIT BASIN OR (CUSTOM PROCEDURE TRAY) ×2 IMPLANT
KIT GASTRIC LAVAGE 34FR ADT (SET/KITS/TRAYS/PACK) ×2 IMPLANT
LUBRICANT JELLY K Y 4OZ (MISCELLANEOUS) IMPLANT
MARKER SKIN DUAL TIP RULER LAB (MISCELLANEOUS) ×2 IMPLANT
NDL SPNL 22GX3.5 QUINCKE BK (NEEDLE) ×1 IMPLANT
NEEDLE SPNL 22GX3.5 QUINCKE BK (NEEDLE) ×2 IMPLANT
PACK CARDIOVASCULAR III (CUSTOM PROCEDURE TRAY) ×2 IMPLANT
POUCH SPECIMEN RETRIEVAL 10MM (ENDOMECHANICALS) IMPLANT
RELOAD 45 VASCULAR/THIN (ENDOMECHANICALS) IMPLANT
RELOAD ENDO STITCH 2.0 (ENDOMECHANICALS) ×4
RELOAD STAPLE 45 2.5 WHT GRN (ENDOMECHANICALS) ×1 IMPLANT
RELOAD STAPLE 45 3.5 BLU ETS (ENDOMECHANICALS) ×1 IMPLANT
RELOAD STAPLE 60 2.6 WHT THN (STAPLE) ×1 IMPLANT
RELOAD STAPLE 60 3.6 BLU REG (STAPLE) ×2 IMPLANT
RELOAD STAPLE 60 3.8 GOLD REG (STAPLE) ×1 IMPLANT
RELOAD STAPLE TA45 3.5 REG BLU (ENDOMECHANICALS) ×4 IMPLANT
RELOAD STAPLER BLUE 60MM (STAPLE) ×3 IMPLANT
RELOAD STAPLER GOLD 60MM (STAPLE) ×1 IMPLANT
RELOAD STAPLER WHITE 60MM (STAPLE) ×1 IMPLANT
RELOAD SUT SNGL STCH BLK 2-0 (ENDOMECHANICALS) IMPLANT
SCISSORS LAP 5X45 EPIX DISP (ENDOMECHANICALS) ×2 IMPLANT
SHEARS HARMONIC ACE PLUS 45CM (MISCELLANEOUS) ×2 IMPLANT
SLEEVE ADV FIXATION 12X100MM (TROCAR) ×4 IMPLANT
SOLUTION ANTI FOG 6CC (MISCELLANEOUS) ×2 IMPLANT
STAPLER ECHELON LONG 60 440 (INSTRUMENTS) ×2 IMPLANT
STAPLER RELOAD BLUE 60MM (STAPLE) ×6
STAPLER RELOAD GOLD 60MM (STAPLE) ×2
STAPLER RELOAD WHITE 60MM (STAPLE) ×2
SUT DEVICE BRAIDED 0X39 (SUTURE) IMPLANT
SUT DVC SILK 2.0X39 (SUTURE) ×10 IMPLANT
SUT DVC VICRYL PGA 2.0X39 (SUTURE) ×5 IMPLANT
SUT MNCRL AB 4-0 PS2 18 (SUTURE) ×2 IMPLANT
SUT RELOAD ENDO STITCH 2.0 (ENDOMECHANICALS) ×2
SUT SURGIDAC NAB ES-9 0 48 120 (SUTURE) ×5 IMPLANT
SUT VIC AB 2-0 SH 27 (SUTURE)
SUT VIC AB 2-0 SH 27X BRD (SUTURE) IMPLANT
SUTURE RELOAD ENDO STITCH 2.0 (ENDOMECHANICALS) ×2 IMPLANT
SYR 10ML ECCENTRIC (SYRINGE) ×2 IMPLANT
SYR 20CC LL (SYRINGE) ×4 IMPLANT
SYR 50ML LL SCALE MARK (SYRINGE) ×2 IMPLANT
TIP RIGID 35CM EVICEL (HEMOSTASIS) ×2 IMPLANT
TOWEL OR 17X26 10 PK STRL BLUE (TOWEL DISPOSABLE) ×2 IMPLANT
TOWEL OR NON WOVEN STRL DISP B (DISPOSABLE) ×2 IMPLANT
TRAY FOLEY W/METER SILVER 16FR (SET/KITS/TRAYS/PACK) ×2 IMPLANT
TROCAR ADV FIXATION 12X100MM (TROCAR) ×2 IMPLANT
TROCAR ADV FIXATION 5X100MM (TROCAR) ×2 IMPLANT
TROCAR BLADELESS OPT 5 100 (ENDOMECHANICALS) ×2 IMPLANT
TROCAR XCEL 12X100 BLDLESS (ENDOMECHANICALS) ×2 IMPLANT
TUBE CALIBRATION LAPBAND (TUBING) ×1 IMPLANT
TUBING CONNECTING 10 (TUBING) ×2 IMPLANT
TUBING ENDO SMARTCAP PENTAX (MISCELLANEOUS) ×2 IMPLANT
TUBING INSUF HEATED (TUBING) ×2 IMPLANT

## 2016-06-30 NOTE — Anesthesia Preprocedure Evaluation (Addendum)
Anesthesia Evaluation  Patient identified by MRN, date of birth, ID band Patient awake    Reviewed: Allergy & Precautions, NPO status , Patient's Chart, lab work & pertinent test results  Airway Mallampati: I  TM Distance: >3 FB Neck ROM: Full    Dental  (+) Chipped, Missing,    Pulmonary asthma ,    breath sounds clear to auscultation       Cardiovascular hypertension,  Rhythm:Regular Rate:Normal     Neuro/Psych  Headaches, PSYCHIATRIC DISORDERS Anxiety Depression  Neuromuscular disease    GI/Hepatic Neg liver ROS, GERD  Medicated,  Endo/Other  negative endocrine ROS  Renal/GU negative Renal ROS  negative genitourinary   Musculoskeletal  (+) Arthritis , Osteoarthritis,    Abdominal   Peds negative pediatric ROS (+)  Hematology negative hematology ROS (+)   Anesthesia Other Findings   Reproductive/Obstetrics negative OB ROS                             Lab Results  Component Value Date   WBC 11.4 (H) 06/24/2016   HGB 13.6 06/24/2016   HCT 41.1 06/24/2016   MCV 82.7 06/24/2016   PLT 311 06/24/2016   Lab Results  Component Value Date   CREATININE 0.78 06/24/2016   BUN 24 (H) 06/24/2016   NA 139 06/24/2016   K 4.0 06/24/2016   CL 102 06/24/2016   CO2 27 06/24/2016   No results found for: INR, PROTIME  06/2016 EKG: normal sinus rhythm.  Anesthesia Physical Anesthesia Plan  ASA: III  Anesthesia Plan: General   Post-op Pain Management:    Induction: Intravenous  PONV Risk Score and Plan: 4 or greater and Ondansetron, Dexamethasone, Propofol, Midazolam and Scopolamine patch - Pre-op  Airway Management Planned:   Additional Equipment:   Intra-op Plan:   Post-operative Plan: Extubation in OR  Informed Consent: I have reviewed the patients History and Physical, chart, labs and discussed the procedure including the risks, benefits and alternatives for the proposed  anesthesia with the patient or authorized representative who has indicated his/her understanding and acceptance.     Plan Discussed with: CRNA  Anesthesia Plan Comments:         Anesthesia Quick Evaluation

## 2016-06-30 NOTE — Discharge Instructions (Signed)
° ° ° °GASTRIC BYPASS/SLEEVE ° Home Care Instructions ° ° These instructions are to help you care for yourself when you go home. ° °Call: If you have any problems. °• Call [phone] and ask for the surgeon on call °• If you need immediate assistance come to the ER at . Tell the ER staff you are a new post-op gastric bypass or gastric sleeve patient  °Signs and symptoms to report: • Severe  vomiting or nausea °o If you cannot handle clear liquids for longer than 1 day, call your surgeon °• Abdominal pain which does not get better after taking your pain medication °• Fever greater than 100.4°  F and chills °• Heart rate over 100 beats a minute °• Trouble breathing °• Chest pain °• Redness,  swelling, drainage, or foul odor at incision (surgical) sites °• If your incisions open or pull apart °• Swelling or pain in calf (lower leg) °• Diarrhea (Loose bowel movements that happen often), frequent watery, uncontrolled bowel movements °• Constipation, (no bowel movements for 3 days) if this happens: °o Take Milk of Magnesia, 2 tablespoons by mouth, 3 times a day for 2 days if needed °o Stop taking Milk of Magnesia once you have had a bowel movement °o Call your doctor if constipation continues °Or °o Take Miralax  (instead of Milk of Magnesia) following the label instructions °o Stop taking Miralax once you have had a bowel movement °o Call your doctor if constipation continues °• Anything you think is “abnormal for you” °  °Normal side effects after surgery: • Unable to sleep at night or unable to concentrate °• Irritability °• Being tearful (crying) or depressed ° °These are common complaints, possibly related to your anesthesia, stress of surgery, and change in lifestyle, that usually go away a few weeks after surgery. If these feelings continue, call your medical doctor.  °Wound Care: You may have surgical glue, steri-strips, or staples over your incisions after surgery °• Surgical glue: Looks like clear  film over your incisions and will wear off a little at a time °• Steri-strips: Adhesive strips of tape over your incisions. You may notice a yellowish color on skin under the steri-strips. This is used to make the steri-strips stick better. Do not pull the steri-strips off - let them fall off °• Staples: Staples may be removed before you leave the hospital °o If you go home with staples, call [facility] for an appointment with your surgeon’s nurse to have staples removed 10 days after surgery, [phone] °• Showering: You may shower two (2) days after your surgery unless your surgeon tells you differently °o Wash gently around incisions with warm soapy water, rinse well, and gently pat dry °o If you have a drain (tube from your incision), you may need someone to hold this while you shower °o No tub baths until staples are removed and incisions are healed °  °Medications: • Medications should be liquid or crushed if larger than the size of a dime °• Extended release pills (medication that releases a little bit at a time through the  day) should not be crushed °• Depending on the size and number of medications you take, you may need to space (take a few throughout the day)/change the time you take your medications so that you do not over-fill your pouch (smaller stomach) °• Make sure you follow-up with you primary care physician to make medication changes needed during rapid weight loss and life -style changes °•   If you have diabetes, follow up with your doctor that orders your diabetes medication(s) within one week after surgery and check your blood sugar regularly   Do not drive while taking narcotics (pain medications)   Do not take acetaminophen (Tylenol) and Roxicet or Lortab Elixir at the same time since these pain medications contain acetaminophen   Diet:  First 2 Weeks You will see the nutritionist about two (2) weeks after your surgery. The nutritionist will increase the types of  foods you can eat if you are handling liquids well:  If you have severe vomiting or nausea and cannot handle clear liquids lasting longer than 1 day call your surgeon Protein Shake  Drink at least 2 ounces of shake 5-6 times per day  Each serving of protein shakes (usually 8-12 ounces) should have a minimum of: o 15 grams of protein o And no more than 5 grams of carbohydrate  Goal for protein each day: o Men = 80 grams per day o Women = 60 grams per day     Protein powder may be added to fluids such as non-fat milk or Lactaid milk or Soy milk (limit to 35 grams added protein powder per serving)  Hydration  Slowly increase the amount of water and other clear liquids as tolerated (See Acceptable Fluids)  Slowly increase the amount of protein shake as tolerated  Sip fluids slowly and throughout the day  May use sugar substitutes in small amounts (no more than 6-8 packets per day; i.e. Splenda)  Fluid Goal  The first goal is to drink at least 8 ounces of protein shake/drink per day (or as directed by the nutritionist); some examples of protein shakes are [employer], [employer], EAS Edge HP, and Unjury. - See handout from pre-op Bariatric Education Class: o Slowly increase the amount of protein shake you drink as tolerated o You may find it easier to slowly sip shakes throughout the day o It is important to get your proteins in first  Your fluid goal is to drink 64-100 ounces of fluid daily o It may take a few weeks to build up to this   32 oz. (or more) should be clear liquids And  32 oz. (or more) should be full liquids (see below for examples)  Liquids should not contain sugar, caffeine, or carbonation  Clear Liquids:  Water of Sugar-free flavored water (i.e. Fruit HO, Propel)  Decaffeinated coffee or tea (sugar-free)  Crystal lite, Wylers Lite, Minute Maid Lite  Sugar-free Jell-O  Bouillon or broth  Sugar-free Popsicle:    - Less than 20 calories  each; Limit 1 per day  Full Liquids:                   Protein Shakes/Drinks + 2 choices per day of other full liquids  Full liquids must be: o No More Than 12 grams of Carbs per serving o No More Than 3 grams of Fat per serving  Strained low-fat cream soup  Non-Fat milk  Fat-free Lactaid Milk  Sugar-free yogurt (Dannon Lite & Fit, Greek yogurt)    Vitamins and Minerals  Start 1 day after surgery unless otherwise directed by your surgeon  2 Chewable Multivitamin / Multimineral Supplement with iron  Chewable Calcium Citrate with Vitamin D-3 (Example: 3 Chewable Calcium  Plus 600 with Vitamin D-3) o Take 500 mg three (3) times a day for a total of 1500 mg each day o Do not take all 3 doses of calcium at  one time as it may cause constipation, and you can only absorb 500 mg at a time o Do not mix multivitamins containing iron with calcium supplements;  take 2 hours apart  Menstruating women and those at risk for anemia ( a blood disease that causes weakness) may need extra iron o Talk to your doctor to see if you need more iron  If you need extra iron: Total daily Iron recommendation (including Vitamins) is 50 to 100 mg Iron/day  Do not stop taking or change any vitamins or minerals until you talk to your nutritionist or surgeon  Your nutritionist and/or surgeon must approve all vitamin and mineral supplements   Activity and Exercise: It is important to continue walking at home. Limit your physical activity as instructed by your doctor. During this time, use these guidelines:  Do not lift anything greater than ten  (10) pounds for at least two (2) weeks  Do not go back to work or drive until Engineer, production says you can  You may have sex when you feel comfortable o It is VERY important for female patients to use a reliable birth control method; fertility often increase after surgery o Do not get pregnant for at least 18 months  Start exercising as soon as your doctor tells  you that you can o Make sure your doctor approves any physical activity  Start with a simple walking program  Walk 5-15 minutes each day, 7 days per week  Slowly increase until you are walking 30-45 minutes per day  Consider joining our [place] program. [phone] or email [email or web address]   Special Instructions Things to remember:   Use your CPAP when sleeping if this applies to you      You will likely have your first fill (fluid added to your band) 6 - 8 weeks after surgery  [facility] has a free Bariatric Surgery Support Group that meets monthly, the 3rd Thursday, [place]. You can see classes online at [email or web address]  It is very important to keep all follow up appointments with your surgeon, nutritionist, primary care physician, and behavioral health practitioner o After the first year, please follow up with your bariatric surgeon and nutritionist at least once a year in order to maintain best weight loss results                    [facility]:  [place]: [phone]               Bariatric Nurse Coordinator: [phone]  Gastric Bypass/Sleeve Home Care Instructions  Rev. 02/2012                                                         Reviewed and Endorsed                                                    by [facility] Patient Education Committee, Jan, 2014

## 2016-06-30 NOTE — Op Note (Signed)
Preop diagnosis: Morbid obesity  Postop diagnosis: Morbid obesity  Body mass index is 41.87 kg/m.  Surgical procedure: Laparoscopic Roux-en-Y gastric bypass  Surgeon: Marland Kitchen T.Davonne Jarnigan M.D.  Asst.: Romana Juniper M.D.  Anesthesia: General  Complications:  None  EBL: Minimal  Drains: None  Disposition: PACU in good condition  Description of procedure: Patient is brought to the operating room and general anesthesia induced. She had received preoperative broad-spectrum IV antibiotics and subcutaneous heparin. The abdomen was widely sterilely prepped and draped. Patient timeout was performed and correct patient and procedure confirmed. Access was obtained with a 12 mm Optiview trocar in the left upper quadrant and pneumoperitoneum established without difficulty. Under direct vision 12 mm trocars were placed laterally in the right upper quadrant, right upper quadrant midclavicular line, and to the left and above the umbilicus for the camera port. A bilateral TAP block was performed under direct vision with Exparel. A 5 mm trocar was placed laterally in the left upper quadrant. The omentum was brought into the upper abdomen and the transverse mesocolon elevated and the ligament of Treitz clearly identified. A 40 cm biliopancreatic limb was then carefully measured from the ligament of Treitz. The small intestine was divided at this point with a single firing of the white load linear stapler. A Penrose drain was sutured to the end of the Roux-en-Y limb for later identification. A 100 cm Roux-en-Y limb was then carefully measured. At this point a side-to-side anastomosis was created between the Roux limb and the end of the biliopancreatic limb. This was accomplished with a single firing of the 45 mm white load linear stapler. The common enterotomy was closed with a running 2-0 Vicryl begun at either end of the enterotomy and tied centrally. The mesenteric defect was then closed with running 2-0 silk.  The omentum was then divided with the harmonic scalpel up towards the transverse colon to allow mobility of the Roux limb toward the gastric pouch. The patient was then placed in steep reversed Trendelenburg. Through a 5 mm subxiphoid site the [facility] retractor was placed and the left lobe of the liver elevated with excellent exposure of the upper stomach and hiatus. The angle of Hiss was then mobilized with the harmonic scalpel. A 4 cm gastric pouch was then carefully measured along the lesser curve of the stomach. Dissection was carried along the lesser curve at this point with the Harmonic scalpel working carefully back toward the lesser sac at right angles to the lesser curve. The free lesser sac was then entered. After being sure all tubes were removed from the stomach an initial firing of the gold load 60 mm linear stapler was fired at right angles across the lesser curve for about 4 cm. The gastric pouch was further mobilized posteriorly and then the pouch was completed with 2 further firings of the 60 mm blue load linear stapler up through the previously dissected angle of His. It was ensured that the pouch was completely mobilized away from the gastric remnant. This created a nice tubular 4-5 cm gastric pouch. The staple line of the gastric remnant was then oversewn with 2-0 silk for hemostasis. The Roux limb was then brought up in an antecolic fashion with the candycane facing to the patient's left without undue tension. The gastrojejunostomy was created with an initial posterior row of 2-0 Vicryl between the Roux limb and the staple line of the gastric pouch. Enterotomies were then made in the gastric pouch and the Roux limb with the harmonic scalpel  and at approximately 2-2-1/2 cm anastomosis was created with a single firing of the blue load linear stapler. The staple line was inspected and was intact without bleeding. The common enterotomy was then closed with running 2-0 Vicryl begun at either end  and tied centrally. The wall tube was then easily passed through the anastomosis and an outer anterior layer of running 2-0 Vicryl was placed. The Ewald tube was removed. With the outlet of the gastrojejunostomy clamped and under saline irrigation the assistant performed upper endoscopy and with the gastric pouch tensely distended with air there was no evidence of leak. The pouch was desufflated. The Terance Hart defect was closed with running 2-0 silk. The abdomen was inspected for any evidence of bleeding or bowel injury and everything looked fine. The Nathanson retractor was removed under direct vision after coating the anastomosis with Evacel tissue sealant. All CO2 was evacuated and trochars removed. Skin incisions were closed with staples. Sponge needle and instrument counts were correct. The patient was taken to the PACU in good condition.     Edward Jolly MD, FACS  06/30/2016, 10:21 AM

## 2016-06-30 NOTE — Op Note (Signed)
Preoperative diagnosis: Roux-en-Y gastric bypass  Postoperative diagnosis: Same   Procedure: Upper endoscopy   Surgeon: Clovis Riley, M.D.  Anesthesia: Gen.   Indications for procedure: This patient was undergoing a Roux-en-Y gastric bypass.   Description of procedure: The endoscopy was placed in the mouth and into the oropharynx and under endoscopic vision it was advanced to the esophagogastric junction. The pouch was insufflated and no bleeding or bubbles were seen. The GEJ was identified at 42cm from the teeth. The anastomosis was identified at 47cm and was widely patent and hemostatic. No bleeding or leaks were detected. The scope was withdrawn without difficulty.   Clovis Riley, M.D. General, Bariatric, & Minimally Invasive Surgery [facility]

## 2016-06-30 NOTE — Anesthesia Procedure Notes (Signed)
Procedure Name: Intubation Date/Time: 06/30/2016 7:44 AM Performed by: Deliah Boston Pre-anesthesia Checklist: Patient identified, Emergency Drugs available, Suction available and Patient being monitored Patient Re-evaluated:Patient Re-evaluated prior to inductionOxygen Delivery Method: Circle system utilized Preoxygenation: Pre-oxygenation with 100% oxygen Intubation Type: IV induction Ventilation: Mask ventilation without difficulty Laryngoscope Size: Mac and 3 Grade View: Grade I Tube type: Oral Number of attempts: 1 Airway Equipment and Method: Stylet and Oral airway Placement Confirmation: ETT inserted through vocal cords under direct vision,  positive ETCO2 and breath sounds checked- equal and bilateral Secured at: 21 cm Tube secured with: Tape Dental Injury: Teeth and Oropharynx as per pre-operative assessment

## 2016-06-30 NOTE — Interval H&P Note (Signed)
History and Physical Interval Note:  06/30/2016 7:07 AM  Caroline Ward  has presented today for surgery, with the diagnosis of MORBID OBESITY  The various methods of treatment have been discussed with the patient and family. After consideration of risks, benefits and other options for treatment, the patient has consented to  Procedure(s): LAPAROSCOPIC ROUX-EN-Y GASTRIC BYPASS WITH UPPER ENDOSCOPY (N/A) as a surgical intervention .  The patient's history has been reviewed, patient examined, no change in status, stable for surgery.  I have reviewed the patient's chart and labs.  Questions were answered to the patient's satisfaction.     Adri Schloss T

## 2016-06-30 NOTE — Transfer of Care (Signed)
Immediate Anesthesia Transfer of Care Note  Patient: Caroline Ward  Procedure(s) Performed: Procedure(s): LAPAROSCOPIC ROUX-EN-Y GASTRIC BYPASS WITH UPPER ENDOSCOPY (N/A)  Patient Location: PACU  Anesthesia Type:General  Level of Consciousness: Patient easily awoken, sedated, comfortable, cooperative, following commands, responds to stimulation.   Airway & Oxygen Therapy: Patient spontaneously breathing, ventilating well, oxygen via simple oxygen mask.  Post-op Assessment: Report given to PACU RN, vital signs reviewed and stable, moving all extremities.   Post vital signs: Reviewed and stable.  Complications: No apparent anesthesia complications Last Vitals:  Vitals:   06/30/16 0517  BP: (!) 152/79  Pulse: (!) 102  Resp: 18  Temp: 36.9 C    Last Pain:  Vitals:   06/30/16 0603  TempSrc:   PainSc: 0-No pain      Patients Stated Pain Goal: 3 (74/[id number])  Complications: No apparent anesthesia complications

## 2016-06-30 NOTE — H&P (Signed)
History of Present Illness Caroline Ward T. Grae Cannata MD; 06/13/2016 12:27 PM) The patient is a 56 year old female who presents with obesity. She returns for preoperative visit prior to planned laparoscopic Roux-en-Y gastric bypass. She has completed her workup. No concerns identified on cyclic nutrition evaluation. Lab work and vitamin levels unremarkable. Ultrasound showed only fatty infiltration of the liver.  The patient gives a history of progressive obesity since early adulthood despite multiple attempts at medical management. She has been through innumerable diets, able to lose up to 60 pounds at a time which helps her feel significantly better but then she experiences progressive weight regain. Obesity has been affecting the patient in a number of ways including increasing difficulties with routine activities experiencing shortness of breath with exertion. She has a diagnosis of asthma and chronic foot pain with plantar fasciitis. She does have apparent significant reflux on Pepcid and Protonix with heartburn and cough that had been corrected with the medication.. Significant co-morbid illnesses have developed including asthma, GERD and chronic extremity pain. The patient has been to our initial information seminar, researched surgical options thoroughly and is well informed regarding options. We elected to proceed with gastric bypass due to her significant reflux.   Problem List/Past Medical Caroline Ward T. [name], MD; 06/13/2016 12:27 PM) MORBID OBESITY WITH BMI OF 45.0-49.9, ADULT (E66.01)   Past Surgical History Caroline Ward T. [name], MD; 06/13/2016 12:27 PM) No pertinent past surgical history   Diagnostic Studies History Caroline Ward T. [name], MD; 06/13/2016 12:27 PM) Colonoscopy  1-5 years ago Mammogram  within last year Pap Smear  1-5 years ago  Allergies (April Staton, CMA; 06/13/2016 11:58 AM) Myrbetriq *URINARY ANTISPASMODICS*  Allergies Reconciled   Medication History  (April Staton, CMA; 06/13/2016 11:59 AM) [place] Database Active. ([place] reviewed. The patient shows no habitual signs of abuse or chronic narcotic prescription use.) Naproxen DR (500MG  Tablet DR, Oral daily) Active. Pantoprazole Sodium (40MG  Tablet DR, Oral daily) Active. Famotidine (20MG  Tablet, Oral daily) Active. Fish Oil (500MG  Capsule, Oral daily) Active. Ginkgo Biloba (120MG  Capsule, Oral as needed) Active. Echinacea (100MG  Capsule, Oral as needed) Active. Multivitamin Adult (Oral daily) Active. Calcium-Magnesium-Zinc (1000-400-15MG  Tablet, Oral daily) Active. Medications Reconciled  Social History Caroline Ward T. Andreana Klingerman, MD; 06/13/2016 12:27 PM) Alcohol use  Occasional alcohol use. Caffeine use  Carbonated beverages, Tea. No drug use  Tobacco use  Never smoker.  Family History Caroline Ward T. Bianna Haran, MD; 06/13/2016 12:27 PM) Alcohol Abuse  Brother, Father. Arthritis  Mother. Cancer  Father. Cerebrovascular Accident  Mother. Colon Polyps  Mother, Sister. Diabetes Mellitus  Mother, Sister. Heart Disease  Family Members In General. Hypertension  Mother. Ischemic Bowel Disease  Mother. Malignant Neoplasm Of Pancreas  Brother. Migraine Headache  Sister. Respiratory Condition  Mother. Seizure disorder  Sister.  Pregnancy / Birth History Caroline Ward T. [name], MD; 06/13/2016 12:27 PM) Age at menarche  65 years. Contraceptive History  Oral contraceptives. Gravida  0 Irregular periods  Para  0  Other Problems Caroline Ward T. [name], MD; 06/13/2016 12:27 PM) Anxiety Disorder  Asthma  Back Pain  Bladder Problems  Depression  Heart murmur  Migraine Headache   Vitals (April Staton CMA; 06/13/2016 12:18 PM) 06/13/2016 11:59 AM Weight: 291.25 lb Height: 68in Body Surface Area: 2.4 m Body Mass Index: 44.28 kg/m  Temp.: 39F(Oral)  Pulse: 78 (Regular)  BP: 150/88 (Sitting, Left Arm, Standard)       Physical Exam Caroline Ward T.  Ariannie Penaloza MD; 06/13/2016 12:28 PM) The physical exam findings are as follows: Note:General: Alert, morbidly obese  Caucasian female, in no distress Skin: Warm and dry without rash or infection. HEENT: No palpable masses or thyromegaly. Sclera nonicteric. Lungs: Breath sounds clear and equal. No wheezing or increased work of breathing. Cardiovascular: Regular rate and rhythm without murmer. No JVD or edema. Abdomen: Nondistended. Soft and nontender. No masses palpable. No organomegaly. No palpable hernias. Extremities: No edema or joint swelling or deformity. No chronic venous stasis changes. Neurologic: Alert and fully oriented. Gait normal. No focal weakness. Psychiatric: Normal mood and affect. Thought content appropriate with normal judgement and insight    Assessment & Plan Caroline Ward T. Travares Nelles MD; 06/13/2016 12:29 PM) MORBID OBESITY WITH BMI OF 45.0-49.9, ADULT (E66.01) Impression: Patient with progressive morbid obesity unresponsive to multiple efforts at medical management who presents with a BMI of 44 and comorbidities of GERD, chronic extremity pain, asthma and urinary incontinence. I believe there would be very significant medical benefit from surgical weight loss. She has successfully completed her preoperative workup as above with no concerns identified. We again reviewed the procedure and risks today and went over the consent form and all her questions were answered. She was given prescriptions for Zofran and oxycodone and she is already on Protonix. She has been taking naproxen which she will now stop and we emphasized she cannot continue that postoperatively.

## 2016-06-30 NOTE — Anesthesia Postprocedure Evaluation (Signed)
Anesthesia Post Note  Patient: Caroline Ward  Procedure(s) Performed: Procedure(s) (LRB): LAPAROSCOPIC ROUX-EN-Y GASTRIC BYPASS WITH UPPER ENDOSCOPY (N/A)     Patient location during evaluation: PACU Anesthesia Type: General Level of consciousness: awake and alert Pain management: pain level controlled Vital Signs Assessment: post-procedure vital signs reviewed and stable Respiratory status: spontaneous breathing, nonlabored ventilation, respiratory function stable and patient connected to nasal cannula oxygen Cardiovascular status: blood pressure returned to baseline and stable Postop Assessment: no signs of nausea or vomiting Anesthetic complications: no    Last Vitals:  Vitals:   06/30/16 1145 06/30/16 1242  BP: 133/73 120/67  Pulse: 79 81  Resp: 16 16  Temp: 36.9 C 36.9 C    Last Pain:  Vitals:   06/30/16 1242  TempSrc: Oral  PainSc:                  Effie Berkshire

## 2016-07-01 ENCOUNTER — Encounter (HOSPITAL_COMMUNITY): Payer: Self-pay | Admitting: General Surgery

## 2016-07-01 LAB — CBC WITH DIFFERENTIAL/PLATELET
BASOS ABS: 0 10*3/uL (ref 0.0–0.1)
BASOS PCT: 0 %
EOS PCT: 0 %
Eosinophils Absolute: 0 10*3/uL (ref 0.0–0.7)
HEMATOCRIT: 31.5 % — AB (ref 36.0–46.0)
Hemoglobin: 10.4 g/dL — ABNORMAL LOW (ref 12.0–15.0)
LYMPHS PCT: 14 %
Lymphs Abs: 1.6 10*3/uL (ref 0.7–4.0)
MCH: 26.8 pg (ref 26.0–34.0)
MCHC: 33 g/dL (ref 30.0–36.0)
MCV: 81.2 fL (ref 78.0–100.0)
MONO ABS: 1 10*3/uL (ref 0.1–1.0)
Monocytes Relative: 9 %
NEUTROS ABS: 8.9 10*3/uL — AB (ref 1.7–7.7)
Neutrophils Relative %: 77 %
PLATELETS: 239 10*3/uL (ref 150–400)
RBC: 3.88 MIL/uL (ref 3.87–5.11)
RDW: 13.8 % (ref 11.5–15.5)
WBC: 11.6 10*3/uL — AB (ref 4.0–10.5)

## 2016-07-01 MED ORDER — ZOLPIDEM TARTRATE 5 MG PO TABS: 5.0000 mg | ORAL_TABLET | Freq: Every evening | ORAL | Status: DC | PRN

## 2016-07-01 MED ORDER — NYSTATIN 100000 UNIT/GM EX POWD
Freq: Three times a day (TID) | CUTANEOUS | Status: DC
  Administered 2016-07-01 (×2): via TOPICAL
  Filled 2016-07-01: qty 15

## 2016-07-01 NOTE — Progress Notes (Signed)
Patient alert and oriented, Post op day 1.  Provided support and encouragement.  Encouraged pulmonary toilet, ambulation and small sips of liquids.  Completed 12 ounces of clear fluids overnight started protein.  All questions answered.  Will continue to monitor.

## 2016-07-01 NOTE — Progress Notes (Signed)
Patient ID: Caroline Ward, female   DOB: Feb 28, 1960, 56 y.o.   MRN: [id number] 1 Day Post-Op   Subjective: Some epigastric pain and pressure this morning, feels like gas. No nausea. Tolerated ice and water last night and starting protein shakes this morning without difficulty. Has been ambulatory.  Objective: Vital signs in last 24 hours: Temp:  [97.8 F (36.6 C)-99.2 F (37.3 C)] 99.2 F (37.3 C) (06/19 0515) Pulse Rate:  [75-88] 81 (06/19 0515) Resp:  [15-22] 18 (06/19 0515) BP: (120-145)/(58-81) 130/73 (06/19 0515) SpO2:  [91 %-100 %] 94 % (06/19 0515) Last BM Date: 06/29/16  Intake/Output from previous day: 06/18 0701 - 06/19 0700 In: 4083.3 [P.O.:450; I.V.:3633.3] Out: 1045 [Urine:1020; Blood:25] Intake/Output this shift: No intake/output data recorded.  General appearance: alert, cooperative and no distress GI: Minimal epigastric tenderness without guarding. Incision/Wound: Clean and dry  Lab Results:   Recent Labs  06/30/16 1356 07/01/16 0527  WBC  --  11.6*  HGB 14.1 10.4*  HCT 41.3 31.5*  PLT  --  239   BMET No results for input(s): NA, K, CL, CO2, GLUCOSE, BUN, CREATININE, CALCIUM in the last 72 hours.   Studies/Results: No results found.  Anti-infectives: Anti-infectives    Start     Dose/Rate Route Frequency Ordered Stop   06/30/16 0545  cefoTEtan in Dextrose 5% (CEFOTAN) IVPB 2 g     2 g Intravenous On call to O.R. 06/30/16 0545 06/30/16 0746      Assessment/Plan: s/p Procedure(s): LAPAROSCOPIC ROUX-EN-Y GASTRIC BYPASS WITH UPPER ENDOSCOPY Doing well postoperatively without complication. Starting protein shakes this morning. Ambulation encouraged. Anticipate discharge tomorrow.   LOS: 1 day    Abbey Veith T 07/01/2016

## 2016-07-01 NOTE — Progress Notes (Signed)
Discharge planning, no HH needs identified. [phone] 

## 2016-07-01 NOTE — Plan of Care (Signed)
Problem: Food- and Nutrition-Related Knowledge Deficit (NB-1.1) Goal: Nutrition education Formal process to instruct or train a patient/client in a skill or to impart knowledge to help patients/clients voluntarily manage or modify food choices and eating behavior to maintain or improve health. Outcome: Completed/Met Date Met: 07/01/16 Nutrition Education Note  Received consult for diet education per DROP protocol.   Discussed 2 week post op diet with pt. Emphasized that liquids must be non carbonated, non caffeinated, and sugar free. Fluid goals discussed. Pt to follow up with outpatient bariatric RD for further diet progression after 2 weeks. Multivitamins and minerals also reviewed. Teach back method used, pt expressed understanding, expect good compliance.   Diet: First 2 Weeks  You will see the nutritionist about two (2) weeks after your surgery. The nutritionist will increase the types of foods you can eat if you are handling liquids well:  If you have severe vomiting or nausea and cannot handle clear liquids lasting longer than 1 day, call your surgeon  Protein Shake  Drink at least 2 ounces of shake 5-6 times per day  Each serving of protein shakes (usually 8 - 12 ounces) should have a minimum of:  15 grams of protein  And no more than 5 grams of carbohydrate  Goal for protein each day:  Men = 80 grams per day  Women = 60 grams per day  Protein powder may be added to fluids such as non-fat milk or Lactaid milk or Soy milk (limit to 35 grams added protein powder per serving)   Hydration  Slowly increase the amount of water and other clear liquids as tolerated (See Acceptable Fluids)  Slowly increase the amount of protein shake as tolerated  Sip fluids slowly and throughout the day  May use sugar substitutes in small amounts (no more than 6 - 8 packets per day; i.e. Splenda)   Fluid Goal  The first goal is to drink at least 8 ounces of protein shake/drink per day (or as directed  by the nutritionist); some examples of protein shakes are Premier Protein, Syntrax Nectar, Adkins Advantage, EAS Edge HP, and Unjury. See handout from pre-op Bariatric Education Class:  Slowly increase the amount of protein shake you drink as tolerated  You may find it easier to slowly sip shakes throughout the day  It is important to get your proteins in first  Your fluid goal is to drink 64 - 100 ounces of fluid daily  It may take a few weeks to build up to this  32 oz (or more) should be clear liquids  And  32 oz (or more) should be full liquids (see below for examples)  Liquids should not contain sugar, caffeine, or carbonation   Clear Liquids:  Water or Sugar-free flavored water (i.e. Fruit H2O, Propel)  Decaffeinated coffee or tea (sugar-free)  Crystal Lite, Wyler's Lite, Minute Maid Lite  Sugar-free Jell-O  Bouillon or broth  Sugar-free Popsicle: *Less than 20 calories each; Limit 1 per day   Full Liquids:  Protein Shakes/Drinks + 2 choices per day of other full liquids  Full liquids must be:  No More Than 12 grams of Carbs per serving  No More Than 3 grams of Fat per serving  Strained low-fat cream soup  Non-Fat milk  Fat-free Lactaid Milk  Sugar-free yogurt (Dannon Lite & Fit, Greek yogurt, Oikos Zero)   Lindsey Baker, MS, RD, LDN Pager: [phone] After Hours Pager: [phone]      

## 2016-07-02 LAB — CBC WITH DIFFERENTIAL/PLATELET
BASOS ABS: 0 10*3/uL (ref 0.0–0.1)
Basophils Relative: 0 %
EOS ABS: 0.1 10*3/uL (ref 0.0–0.7)
EOS PCT: 1 %
HCT: 36.5 % (ref 36.0–46.0)
Hemoglobin: 12.3 g/dL (ref 12.0–15.0)
LYMPHS PCT: 23 %
Lymphs Abs: 2.8 10*3/uL (ref 0.7–4.0)
MCH: 27.4 pg (ref 26.0–34.0)
MCHC: 33.7 g/dL (ref 30.0–36.0)
MCV: 81.3 fL (ref 78.0–100.0)
MONO ABS: 0.7 10*3/uL (ref 0.1–1.0)
Monocytes Relative: 6 %
Neutro Abs: 8.5 10*3/uL — ABNORMAL HIGH (ref 1.7–7.7)
Neutrophils Relative %: 70 %
PLATELETS: 260 10*3/uL (ref 150–400)
RBC: 4.49 MIL/uL (ref 3.87–5.11)
RDW: 13.9 % (ref 11.5–15.5)
WBC: 12.1 10*3/uL — ABNORMAL HIGH (ref 4.0–10.5)

## 2016-07-02 MED ORDER — NYSTATIN 100000 UNIT/GM EX POWD: Freq: Three times a day (TID) | CUTANEOUS | 0 refills | Status: DC

## 2016-07-02 NOTE — Progress Notes (Signed)
Patient alert and oriented, pain is controlled. Patient is tolerating fluids, advanced to protein shake today, patient is tolerating well. Reviewed Gastric Bypass discharge instructions with patient and patient is able to articulate understanding. Provided information on BELT program, Support Group and [facility] outpatient pharmacy. All questions answered, will continue to monitor.    

## 2016-07-02 NOTE — Progress Notes (Signed)
Pt ambulating in hallway and tolerating her shakes.  No complaints of pain. D/C instructions were given.

## 2016-07-02 NOTE — Progress Notes (Signed)
Patient ID: Caroline Ward, female   DOB: 09-07-1960, 56 y.o.   MRN: [id number] 2 Days Post-Op   Subjective: No complaints this morning. A little bloated and passing just a small amount of gas. No pain and not requiring pain medication. Ambulatory. Tolerating fluids without nausea or pain.  Objective: Vital signs in last 24 hours: Temp:  [97.9 F (36.6 C)-99.1 F (37.3 C)] 97.9 F (36.6 C) (06/20 0556) Pulse Rate:  [72-85] 76 (06/20 0556) Resp:  [18] 18 (06/20 0556) BP: (127-152)/(63-82) 127/66 (06/20 0556) SpO2:  [95 %-99 %] 96 % (06/20 0556) Weight:  [128.5 kg (283 lb 3.2 oz)] 128.5 kg (283 lb 3.2 oz) (06/19 1100) Last BM Date: 06/29/16  Intake/Output from previous day: 06/19 0701 - 06/20 0700 In: 2895 [P.O.:1020; I.V.:1875] Out: 5000 [Urine:5000] Intake/Output this shift: No intake/output data recorded.  General appearance: alert, cooperative and no distress GI: normal findings: soft, non-tender Incision/Wound: Clean and dry without erythema  Lab Results:   Recent Labs  07/01/16 0527 07/02/16 0546  WBC 11.6* 12.1*  HGB 10.4* 12.3  HCT 31.5* 36.5  PLT 239 260   BMET No results for input(s): NA, K, CL, CO2, GLUCOSE, BUN, CREATININE, CALCIUM in the last 72 hours.   Studies/Results: No results found.  Anti-infectives: Anti-infectives    Start     Dose/Rate Route Frequency Ordered Stop   06/30/16 0545  cefoTEtan in Dextrose 5% (CEFOTAN) IVPB 2 g     2 g Intravenous On call to O.R. 06/30/16 0545 06/30/16 0746      Assessment/Plan: s/p Procedure(s): LAPAROSCOPIC ROUX-EN-Y GASTRIC BYPASS WITH UPPER ENDOSCOPY Doing well postoperatively. She has a mild leukocytosis but no significant pain, no tenderness and vital signs all stable. I think she is doing well and okay for discharge today if tolerating protein shakes well.   LOS: 2 days    Brenlee Koskela T 07/02/2016

## 2016-07-02 NOTE — Discharge Summary (Signed)
Patient ID: Caroline Ward [id number] 55 y.o. [birth year]  06/30/2016  Discharge date and time: 07/02/2016   Admitting Physician: Excell Seltzer T  Discharge Physician: Excell Seltzer T  Admission Diagnoses: MORBID OBESITY  Discharge Diagnoses: Same  Operations: Procedure(s): LAPAROSCOPIC ROUX-EN-Y GASTRIC BYPASS WITH UPPER ENDOSCOPY  Admission Condition: good  Discharged Condition: good  Indication for Admission: Patient is a 56 year old female with progressive morbid obesity unresponsive to multiple efforts at medical management who presents with a BMI of 44 and comorbidities of GERD, chronic extremity pain, asthma and urinary incontinence. Following extensive preoperative discussion and workup detailed elsewhere she is electively admitted for laparoscopic Roux-en-Y gastric bypass for treatment of her morbid obesity.  Hospital Course: On the morning of admission the patient underwent an uneventful laparoscopic Roux-en-Y gastric bypass. Her postoperative course was unremarkable. Vital signs remained stable and she had only mild pain. Started on ice and sips of water the day of surgery. On the first postoperative day she was begun on protein shakes. She had some epigastric discomfort that resolved after 1 day. On the day of discharge she is tolerating protein shakes. Ambulatory. Abdomen is benign. She is felt ready for discharge.   Disposition: Home  Patient Instructions:  Allergies as of 07/02/2016      Reactions   Myrbetriq [mirabegron] Itching      Medication List    STOP taking these medications   amoxicillin 875 MG tablet Commonly known as:  AMOXIL   naproxen 500 MG tablet Commonly known as:  NAPROSYN     TAKE these medications   acetaminophen 500 MG tablet Commonly known as:  TYLENOL Take 1,000 mg by mouth daily as needed for mild pain.   ALPRAZolam 0.25 MG tablet Commonly known as:  XANAX Take 1 tablet (0.25 mg total) by mouth 3 (three) times daily as  needed for anxiety.   beclomethasone 80 MCG/ACT inhaler Commonly known as:  QVAR Inhale 1 puff into the lungs daily as needed (asthma attack).   Biotin 1000 MCG tablet Take 1,000 mcg by mouth 2 (two) times daily.   CALCIUM PO Take 1 tablet by mouth 3 (three) times daily.   citalopram 20 MG tablet Commonly known as:  CELEXA TAKE 1 TABLET BY MOUTH EVERY DAY   Echinacea 125 MG Caps Take 125 mg by mouth daily.   famotidine 20 MG tablet Commonly known as:  PEPCID One at bedtime What changed:  how much to take  how to take this  when to take this  additional instructions   Fish Oil 500 MG Caps Take 500 mg by mouth daily.   fluticasone 50 MCG/ACT nasal spray Commonly known as:  FLONASE Place 2 sprays into both nostrils daily. What changed:  when to take this  reasons to take this   multivitamin capsule Take 1 capsule by mouth daily.   nystatin powder Commonly known as:  MYCOSTATIN/NYSTOP Apply topically 3 (three) times daily.   pantoprazole 40 MG tablet Commonly known as:  PROTONIX TAKE 1 TABLET BY MOUTH EVERY DAY 30-60 MINUTES BEFORE FIRST MEAL   RA CHLORPHENIRAMINE PO Take 1 tablet by mouth daily.   vitamin B-12 100 MCG tablet Commonly known as:  CYANOCOBALAMIN Take 100 mcg by mouth daily.   vitamin C 250 MG tablet Commonly known as:  ASCORBIC ACID Take 250 mg by mouth 2 (two) times daily.       Activity: activity as tolerated Diet: Bariatric protein shakes Wound Care: none needed  Follow-up:  With Dr. Excell Seltzer in 3 weeks.  Signed: Edward Jolly MD, FACS  07/02/2016, 7:52 AM

## 2016-07-10 ENCOUNTER — Telehealth (HOSPITAL_COMMUNITY): Payer: Self-pay

## 2016-07-10 NOTE — Telephone Encounter (Signed)
Made discharge phone call to patient.  Asking the following questions.    1. Do you have someone to care for you now that you are home?  independent 2. Are you having pain now that is not relieved by your pain medication?  Doesn't need 3. Are you able to drink the recommended daily amount of fluids (48 ounces minimum/day) and protein (60-80 grams/day) as prescribed by the dietitian or nutritional counselor?  60 grams of protein 50 ounces of fluid 4. Are you taking the vitamins and minerals as prescribed?  Had trouble at first restarted them 5. Do you have the "on call" number to contact your surgeon if you have a problem or question?  yes 6. Are your incisions free of redness, swelling or drainage? (If steri strips, address that these can fall off, shower as tolerated) no problems with incisions 7. Have your bowels moved since your surgery?  If not, are you passing gas?  yes 8. Are you up and walking 3-4 times per day?  yes 9. Were you provided your discharge medications before your surgery or before you were discharged from the hospital and are you taking them without problem?  yes

## 2016-07-15 ENCOUNTER — Encounter: Payer: BLUE CROSS/BLUE SHIELD | Attending: General Surgery | Admitting: Skilled Nursing Facility1

## 2016-07-15 DIAGNOSIS — Z6841 Body Mass Index (BMI) 40.0 and over, adult: Secondary | ICD-10-CM | POA: Insufficient documentation

## 2016-07-15 DIAGNOSIS — E669 Obesity, unspecified: Secondary | ICD-10-CM

## 2016-07-15 DIAGNOSIS — Z713 Dietary counseling and surveillance: Secondary | ICD-10-CM | POA: Diagnosis not present

## 2016-07-17 ENCOUNTER — Encounter: Payer: Self-pay | Admitting: Skilled Nursing Facility1

## 2016-07-17 NOTE — Progress Notes (Signed)
Bariatric Class:  Appt start time: 1530 end time:  1630.  2 Week Post-Operative Nutrition Class  Patient was seen on 07/15/2016 for Post-Operative Nutrition education at [facility].   Surgery date: 06/30/2016 Surgery type: RYGB Start weight at [facility]: 289.8 Weight today: 266.8  TANITA  BODY COMP RESULTS  07/17/2016   BMI (kg/m^2) 41.8   Fat Mass (lbs) 142.4   Fat Free Mass (lbs) 124.4   Total Body Water (lbs) 91.6   The following the learning objectives were met by the patient during this course:  Identifies Phase 3A (Soft, High Proteins) Dietary Goals and will begin from 2 weeks post-operatively to 2 months post-operatively  Identifies appropriate sources of fluids and proteins   States protein recommendations and appropriate sources post-operatively  Identifies the need for appropriate texture modifications, mastication, and bite sizes when consuming solids  Identifies appropriate multivitamin and calcium sources post-operatively  Describes the need for physical activity post-operatively and will follow MD recommendations  States when to call healthcare provider regarding medication questions or post-operative complications  Handouts given during class include:  Phase 3A: Soft, High Protein Diet Handout  Follow-Up Plan: Patient will follow-up at [facility] in 6 weeks for 2 month post-op nutrition visit for diet advancement per MD.    

## 2016-08-26 ENCOUNTER — Encounter: Payer: Self-pay | Admitting: Skilled Nursing Facility1

## 2016-08-26 ENCOUNTER — Encounter: Payer: BLUE CROSS/BLUE SHIELD | Attending: General Surgery | Admitting: Skilled Nursing Facility1

## 2016-08-26 DIAGNOSIS — Z713 Dietary counseling and surveillance: Secondary | ICD-10-CM | POA: Diagnosis not present

## 2016-08-26 DIAGNOSIS — Z6841 Body Mass Index (BMI) 40.0 and over, adult: Secondary | ICD-10-CM | POA: Insufficient documentation

## 2016-08-26 DIAGNOSIS — E669 Obesity, unspecified: Secondary | ICD-10-CM

## 2016-08-26 NOTE — Progress Notes (Signed)
  Follow-up visit:  8 Weeks Post-Operative RYGB Surgery Primary concerns today: Post-operative Bariatric Surgery Nutrition Management.  Pt states she has a leak in her kitchen so she has not been able to cook. Pt states she eats every 2.5 hours. Pt states she does not tolerate [place] lunch meat. Pt states she has been looking up her restaurant meals. Pt states tries to only weigh once a week. Pt states she was active but lost the routine due to kitchen issues. Pt arrived with many questions and felt much better knowing her weight loss may be slow but it is all fat gone.  Discussed protein bars with pt.   Surgery date: 06/30/2016 Surgery type: RYGB Start weight at [facility]: 289.8 Weight today: 250.6 Weight change: 16.2  TANITA  BODY COMP RESULTS  07/17/2016 08/26/2016   BMI (kg/m^2) 41.8 39.2   Fat Mass (lbs) 142.4 126   Fat Free Mass (lbs) 124.4 124.6   Total Body Water (lbs) 91.6 91.2    24-hr recall: B (AM): half premier shake (15g) 1 egg (7g)----egg salad Snk (AM):  L (PM): refried beans and meat and cheese Snk (PM):  Half a shake D (PM): protein shake Snk (PM):   Fluid intake: 1-1.5 shakes, water: 64 fluid ounces 90% of the time  Estimated total protein intake: 70g  Medications: See List Supplementation: multivitamin and calcium   Using straws: no Drinking while eating: no Having you been chewing well:no Chewing/swallowing difficulties: no Changes in vision: no Changes to mood/headaches: no Hair loss/Cahnges to skin/Changes to nails: no Any difficulty focusing or concentrating: no Sweating: no Dizziness/Lightheaded: no Palpitations: no  Carbonated beverages: no N/V/D/C/GAS: no Abdominal Pain: no Dumping syndrome: no  Recent physical activity:  Walking 45 minutes 4-5 days a week  Progress Towards Goal(s):  In progress.  Handouts given during visit include:  Non starchy veggies + protein   Nutritional Diagnosis:  [place]-3.3 Overweight/obesity related to past poor  dietary habits and physical inactivity as evidenced by patient w/ recent RYGB surgery following dietary guidelines for continued weight loss.  Intervention:  Nutrition counseling. Dietitian educated the pt on advancing her diet to include non-starchy veggies. Goals: Stop taking the vitamin B12 -Try to be intentional about getting your fluid in -Aim for 70 fluid ounces every day  Teaching Method Utilized:  Visual Auditory Hands on  Barriers to learning/adherence to lifestyle change: none identified   Demonstrated degree of understanding via:  Teach Back   Monitoring/Evaluation:  Dietary intake, exercise, and body weight.

## 2016-08-26 NOTE — Patient Instructions (Addendum)
-  Stop taking the vitamin B12  -Try to be intentional about getting your fluid in  -Aim for 70 fluid ounces every day

## 2016-10-15 ENCOUNTER — Other Ambulatory Visit: Payer: Self-pay | Admitting: Family Medicine

## 2016-10-15 DIAGNOSIS — K2100 Gastro-esophageal reflux disease with esophagitis, without bleeding: Secondary | ICD-10-CM

## 2016-10-21 ENCOUNTER — Encounter: Payer: Self-pay | Admitting: Family Medicine

## 2016-10-21 ENCOUNTER — Ambulatory Visit (INDEPENDENT_AMBULATORY_CARE_PROVIDER_SITE_OTHER): Payer: BLUE CROSS/BLUE SHIELD | Admitting: Family Medicine

## 2016-10-21 VITALS — BP 100/72 | HR 65 | Temp 97.8°F | Resp 16 | Ht 67.0 in | Wt 227.0 lb

## 2016-10-21 DIAGNOSIS — F411 Generalized anxiety disorder: Secondary | ICD-10-CM

## 2016-10-21 DIAGNOSIS — Z1159 Encounter for screening for other viral diseases: Secondary | ICD-10-CM

## 2016-10-21 DIAGNOSIS — F419 Anxiety disorder, unspecified: Secondary | ICD-10-CM | POA: Diagnosis not present

## 2016-10-21 DIAGNOSIS — Z23 Encounter for immunization: Secondary | ICD-10-CM | POA: Diagnosis not present

## 2016-10-21 DIAGNOSIS — Z9884 Bariatric surgery status: Secondary | ICD-10-CM | POA: Diagnosis not present

## 2016-10-21 MED ORDER — CITALOPRAM HYDROBROMIDE 20 MG PO TABS: 20.0000 mg | ORAL_TABLET | Freq: Every day | ORAL | 4 refills | Status: DC

## 2016-10-21 NOTE — Patient Instructions (Signed)

## 2016-10-21 NOTE — Assessment & Plan Note (Signed)
Worsening symptoms  Inc celexa to 20 mg 1 1/2 tab daily after 2-3 weeks can inc to 40 mg  rto 3 months or sooner prn

## 2016-10-21 NOTE — Progress Notes (Signed)
Patient ID: Caroline Ward, female    DOB: March 20, 1960  Age: 56 y.o. MRN: [id number]    Subjective:  Subjective  HPI Caroline Ward presents for f/u anxiety.  She states her symptoms are worse since her bariatric surgery.   No other complaints.  She would like her bld work done as well   Review of Systems  Constitutional: Negative for activity change, appetite change, diaphoresis, fatigue and unexpected weight change.  Eyes: Negative for pain, redness and visual disturbance.  Respiratory: Negative for cough, chest tightness, shortness of breath and wheezing.   Cardiovascular: Negative for chest pain, palpitations and leg swelling.  Endocrine: Negative for cold intolerance, heat intolerance, polydipsia, polyphagia and polyuria.  Genitourinary: Negative for difficulty urinating, dysuria and frequency.  Neurological: Negative for dizziness, light-headedness, numbness and headaches.  Psychiatric/Behavioral: Negative for behavioral problems and dysphoric mood. The patient is not nervous/anxious.     History Past Medical History:  Diagnosis Date  . Anxiety   . Asthma    on inhaler  . Complication of anesthesia   . Depression   . GERD (gastroesophageal reflux disease)   . Heart murmur   . Hemorrhoids    in past  . Migraine   . Pneumonia    3 years ago/has bronchitis often  . UTI (urinary tract infection)    in past    She has a past surgical history that includes Wisdom tooth extraction; Fracture surgery; and Gastric Roux-En-Y (N/A, 06/30/2016).   Her family history includes Alcohol abuse in her father; Asthma in her other; Breast cancer in her maternal aunt; COPD in her mother; Cancer in her paternal uncle; Colon polyps in her mother and sister; Dementia in her maternal aunt; Diabetes in her other; Hyperlipidemia in her mother; Hypertension in her mother; Liver disease in her maternal aunt; Lung cancer in her father; Other in her brother and sister; Pancreatic cancer in her brother;  Throat cancer in her father.She reports that she has never smoked. She has never used smokeless tobacco. She reports that she does not drink alcohol or use drugs.  Current Outpatient Prescriptions on File Prior to Visit  Medication Sig Dispense Refill  . acetaminophen (TYLENOL) 500 MG tablet Take 1,000 mg by mouth daily as needed for mild pain.     Marland Kitchen ALPRAZolam (XANAX) 0.25 MG tablet Take 1 tablet (0.25 mg total) by mouth 3 (three) times daily as needed for anxiety. (Patient taking differently: Take 0.25 mg by mouth 3 (three) times daily as needed for anxiety. Usually takes once a month) 30 tablet 0  . Biotin 1000 MCG tablet Take 1,000 mcg by mouth 2 (two) times daily.     Marland Kitchen CALCIUM PO Take 1 tablet by mouth 3 (three) times daily.    . Multiple Vitamin (MULTIVITAMIN) capsule Take 1 capsule by mouth daily.    Marland Kitchen nystatin (MYCOSTATIN/NYSTOP) powder Apply topically 3 (three) times daily. 15 g 0   No current facility-administered medications on file prior to visit.      Objective:  Objective  Physical Exam  Constitutional: She is oriented to person, place, and time. She appears well-developed and well-nourished.  HENT:  Head: Normocephalic and atraumatic.  Eyes: Conjunctivae and EOM are normal.  Neck: Normal range of motion. Neck supple. No JVD present. Carotid bruit is not present. No thyromegaly present.  Cardiovascular: Normal rate, regular rhythm and normal heart sounds.   No murmur heard. Pulmonary/Chest: Effort normal and breath sounds normal. No respiratory distress. She has no wheezes.  She has no rales. She exhibits no tenderness.  Musculoskeletal: She exhibits no edema.  Neurological: She is alert and oriented to person, place, and time.  Psychiatric: Her mood appears anxious.  Nursing note and vitals reviewed.  BP 100/72 (BP Location: Left Arm, Patient Position: Sitting, Cuff Size: Normal)   Pulse 65   Temp 97.8 F (36.6 C) (Oral)   Resp 16   Ht 5\' 7"  (1.702 m)   Wt 227 lb  (103 kg)   LMP 04/29/2014   SpO2 97%   BMI 35.55 kg/m  Wt Readings from Last 3 Encounters:  10/21/16 227 lb (103 kg)  08/26/16 250 lb 9.6 oz (113.7 kg)  07/17/16 266 lb 12.8 oz (121 kg)     Lab Results  Component Value Date   WBC 12.1 (H) 07/02/2016   HGB 12.3 07/02/2016   HCT 36.5 07/02/2016   PLT 260 07/02/2016   GLUCOSE 89 06/24/2016   CHOL 171 08/16/2015   TRIG 88.0 08/16/2015   HDL 43.60 08/16/2015   LDLDIRECT 144.8 05/19/2011   LDLCALC 110 (H) 08/16/2015   ALT 33 06/24/2016   AST 32 06/24/2016   NA 139 06/24/2016   K 4.0 06/24/2016   CL 102 06/24/2016   CREATININE 0.78 06/24/2016   BUN 24 (H) 06/24/2016   CO2 27 06/24/2016   TSH 1.93 07/25/2014    No results found.   Assessment & Plan:  Plan  I have discontinued Ms. Curington's famotidine, beclomethasone, fluticasone, Fish Oil, Echinacea, vitamin C, vitamin B-12, Chlorpheniramine Maleate (RA CHLORPHENIRAMINE PO), and pantoprazole. I have also changed her citalopram. Additionally, I am having her maintain her multivitamin, acetaminophen, Biotin, ALPRAZolam, CALCIUM PO, and nystatin.  Meds ordered this encounter  Medications  . citalopram (CELEXA) 20 MG tablet    Sig: Take 1 tablet (20 mg total) by mouth daily. 1 1/2 tab po qd    Dispense:  30 tablet    Refill:  4    Problem List Items Addressed This Visit      Unprioritized   Anxiety state    Worsening symptoms  Inc celexa to 20 mg 1 1/2 tab daily after 2-3 weeks can inc to 40 mg  rto 3 months or sooner prn       Relevant Medications   citalopram (CELEXA) 20 MG tablet    Other Visit Diagnoses    Anxiety    -  Primary   Relevant Medications   citalopram (CELEXA) 20 MG tablet   Need for influenza vaccination       Relevant Orders   Flu Vaccine QUAD 36+ mos IM (Completed)   H/O gastric bypass       Relevant Orders   Vitamin B12   TSH   Lipid panel   CBC with Differential/Platelet   Comprehensive metabolic panel   Vitamin D (25 hydroxy)    Vitamin D 1,25 dihydroxy   Vitamin B1   Folate   Iron   Need for hepatitis C screening test       Relevant Orders   Hepatitis C antibody      Follow-up: Return in about 3 months (around 01/21/2017), or if symptoms worsen or fail to improve.  Ann Held, DO

## 2016-10-22 LAB — VITAMIN D 25 HYDROXY (VIT D DEFICIENCY, FRACTURES): VITD: 41.14 ng/mL (ref 30.00–100.00)

## 2016-10-22 LAB — TSH: TSH: 1.16 u[IU]/mL (ref 0.35–4.50)

## 2016-10-22 LAB — COMPREHENSIVE METABOLIC PANEL
ALBUMIN: 4 g/dL (ref 3.5–5.2)
ALK PHOS: 75 U/L (ref 39–117)
ALT: 31 U/L (ref 0–35)
AST: 25 U/L (ref 0–37)
BILIRUBIN TOTAL: 0.4 mg/dL (ref 0.2–1.2)
BUN: 22 mg/dL (ref 6–23)
CALCIUM: 9.4 mg/dL (ref 8.4–10.5)
CO2: 28 mEq/L (ref 19–32)
CREATININE: 0.71 mg/dL (ref 0.40–1.20)
Chloride: 105 mEq/L (ref 96–112)
GFR: 91.25 mL/min (ref >60.00)
Glucose, Bld: 89 mg/dL (ref 70–99)
Potassium: 4.1 mEq/L (ref 3.5–5.1)
Sodium: 140 mEq/L (ref 135–145)
Total Protein: 6.8 g/dL (ref 6.0–8.3)

## 2016-10-22 LAB — CBC WITH DIFFERENTIAL/PLATELET
BASOS ABS: 0.1 10*3/uL (ref 0.0–0.1)
BASOS PCT: 0.8 % (ref 0.0–3.0)
EOS ABS: 0.3 10*3/uL (ref 0.0–0.7)
Eosinophils Relative: 3.1 % (ref 0.0–5.0)
HEMATOCRIT: 41.9 % (ref 36.0–46.0)
HEMOGLOBIN: 13.5 g/dL (ref 12.0–15.0)
LYMPHS PCT: 17.1 % (ref 12.0–46.0)
Lymphs Abs: 1.7 10*3/uL (ref 0.7–4.0)
MCHC: 32.1 g/dL (ref 30.0–36.0)
MCV: 85.2 fl (ref 78.0–100.0)
MONOS PCT: 5.5 % (ref 3.0–12.0)
Monocytes Absolute: 0.5 10*3/uL (ref 0.1–1.0)
Neutro Abs: 7.2 10*3/uL (ref 1.4–7.7)
Neutrophils Relative %: 73.5 % (ref 43.0–77.0)
Platelets: 269 10*3/uL (ref 150.0–400.0)
RBC: 4.92 Mil/uL (ref 3.87–5.11)
RDW: 15.6 % — ABNORMAL HIGH (ref 11.5–15.5)
WBC: 9.8 10*3/uL (ref 4.0–10.5)

## 2016-10-22 LAB — LIPID PANEL
CHOL/HDL RATIO: 4
CHOLESTEROL: 151 mg/dL (ref 0–200)
HDL: 35.8 mg/dL — AB (ref >39.00)
LDL CALC: 98 mg/dL (ref 0–99)
NonHDL: 115.22
Triglycerides: 85 mg/dL (ref 0.0–149.0)
VLDL: 17 mg/dL (ref 0.0–40.0)

## 2016-10-22 LAB — IRON: Iron: 78 ug/dL (ref 42–145)

## 2016-10-22 LAB — FOLATE: FOLATE: 17 ng/mL (ref >5.9)

## 2016-10-22 LAB — VITAMIN B12: VITAMIN B 12: 1377 pg/mL — AB (ref 211–911)

## 2016-10-26 LAB — VITAMIN D 1,25 DIHYDROXY
Vitamin D 1, 25 (OH)2 Total: 29 pg/mL (ref 18–72)
Vitamin D3 1, 25 (OH)2: 29 pg/mL

## 2016-10-26 LAB — HEPATITIS C ANTIBODY
Hepatitis C Ab: NONREACTIVE
SIGNAL TO CUT-OFF: 0.01 (ref <1.00)

## 2016-10-26 LAB — VITAMIN B1: Vitamin B1 (Thiamine): 27 nmol/L (ref 8–30)

## 2016-10-30 ENCOUNTER — Ambulatory Visit: Payer: Self-pay | Admitting: Family Medicine

## 2016-11-07 ENCOUNTER — Encounter: Payer: Self-pay | Admitting: Family Medicine

## 2016-11-07 MED ORDER — CITALOPRAM HYDROBROMIDE 10 MG PO TABS: 30.0000 mg | ORAL_TABLET | Freq: Every day | ORAL | 5 refills | Status: DC

## 2016-11-07 NOTE — Telephone Encounter (Signed)
Please send celexa10 mg #90  3 po qd,  5 refills

## 2016-11-07 NOTE — Telephone Encounter (Signed)
Rx sent 

## 2016-11-11 ENCOUNTER — Encounter: Payer: BLUE CROSS/BLUE SHIELD | Attending: General Surgery | Admitting: Skilled Nursing Facility1

## 2016-11-11 ENCOUNTER — Encounter: Payer: Self-pay | Admitting: Skilled Nursing Facility1

## 2016-11-11 DIAGNOSIS — Z713 Dietary counseling and surveillance: Secondary | ICD-10-CM | POA: Insufficient documentation

## 2016-11-11 DIAGNOSIS — Z6841 Body Mass Index (BMI) 40.0 and over, adult: Secondary | ICD-10-CM | POA: Insufficient documentation

## 2016-11-11 DIAGNOSIS — E669 Obesity, unspecified: Secondary | ICD-10-CM

## 2016-11-11 NOTE — Progress Notes (Signed)
  Follow-up visit:  8 Weeks Post-Operative RYGB Surgery Primary concerns today: Post-operative Bariatric Surgery Nutrition Management.  Pt states she is feeling really good. Pt states there are not a lot of vegetables she likes. Pt states using her straw helps. Pt states doing water aerobics make she very tired and lightheaded. Pt states she still needs the benefiber twice a day.   Surgery date: 06/30/2016 Surgery type: RYGB Start weight at [facility]: 289.8 Weight today: 217.6 Weight change: 9.4  TANITA  BODY COMP RESULTS  07/17/2016 08/26/2016 11/11/2016   BMI (kg/m^2) 41.8 39.2 34.1   Fat Mass (lbs) 142.4 126 101.2   Fat Free Mass (lbs) 124.4 124.6 116.4   Total Body Water (lbs) 91.6 91.2 84    24-hr recall: B (AM): half premier shake (15g) 1 egg (7g)----egg salad Snk (AM): other half of shake L (PM): refried beans and meat and cheese or another shake Snk (PM):  Half a shake or protein bars D (PM): 3 ounces of meat and broccoli Snk (PM):   Fluid intake: 1-1.5 shakes, water: 70 fluid ounces 90% of the time  Estimated total protein intake: 70g  Medications: See List Supplementation: multivitamin procare health capsule and 3 calcium   Using straws: no Drinking while eating: no Having you been chewing well:no Chewing/swallowing difficulties: no Changes in vision: no Changes to mood/headaches: no Hair loss/Cahnges to skin/Changes to nails: no Any difficulty focusing or concentrating: no Sweating: no Dizziness/Lightheaded: no Palpitations: no  Carbonated beverages: no N/V/D/C/GAS: needing benefiber Abdominal Pain: no Dumping syndrome: no  Recent physical activity:  Walking 60 minutes 2-3 days a week  Progress Towards Goal(s):  In progress.  Handouts given during visit include:  Non starchy veggies + protein   Nutritional Diagnosis:  [place]-3.3 Overweight/obesity related to past poor dietary habits and physical inactivity as evidenced by patient w/ recent RYGB surgery  following dietary guidelines for continued weight loss.  Intervention:  Nutrition counseling. Dietitian educated the pt on advancing her diet to include non-starchy veggies. Goals: -Aim to get most of your nutrients from eating your foods instead of relying on protein shakes: limit protein shakes to 1 -Have some fruit before you workout and before the hot tub -Have vegetables with every lunch and every dinner -Try the kefir  Teaching Method Utilized:  Visual Auditory Hands on  Barriers to learning/adherence to lifestyle change: none identified   Demonstrated degree of understanding via:  Teach Back   Monitoring/Evaluation:  Dietary intake, exercise, and body weight.

## 2016-11-11 NOTE — Patient Instructions (Addendum)
-  Aim to get most of your nutrients from eating your foods instead of relying on protein shakes: limit protein shakes to 1  -Have some fruit before you workout and before the hot tub  -Have vegetables with every lunch and every dinner  -Try the kefir

## 2016-12-08 ENCOUNTER — Other Ambulatory Visit: Payer: Self-pay | Admitting: Family Medicine

## 2016-12-08 DIAGNOSIS — F411 Generalized anxiety disorder: Secondary | ICD-10-CM

## 2016-12-11 ENCOUNTER — Other Ambulatory Visit: Payer: Self-pay | Admitting: Family Medicine

## 2016-12-11 DIAGNOSIS — F411 Generalized anxiety disorder: Secondary | ICD-10-CM

## 2016-12-11 MED ORDER — ALPRAZOLAM 0.25 MG PO TABS: 0.2500 mg | ORAL_TABLET | Freq: Three times a day (TID) | ORAL | 1 refills | Status: DC | PRN

## 2016-12-11 NOTE — Telephone Encounter (Signed)
[facility] requesting refill on Xanax  Database ran and is on your desk for review  Last filled per database: 04/22/16 Last written: 04/22/16 Last ov: 10/21/16 Next ov: 01/23/16 Contract: none UDS: none

## 2017-01-02 ENCOUNTER — Telehealth: Payer: Self-pay | Admitting: Family Medicine

## 2017-01-02 NOTE — Telephone Encounter (Signed)
Rx for Caroline Ward was not picked up dated 01/22/2016. Document was shredded

## 2017-01-12 ENCOUNTER — Encounter: Payer: Self-pay | Admitting: Medical

## 2017-01-22 ENCOUNTER — Ambulatory Visit: Payer: BLUE CROSS/BLUE SHIELD | Admitting: Family Medicine

## 2017-01-22 ENCOUNTER — Telehealth: Payer: Self-pay | Admitting: Skilled Nursing Facility1

## 2017-01-22 DIAGNOSIS — R140 Abdominal distension (gaseous): Secondary | ICD-10-CM | POA: Diagnosis not present

## 2017-01-22 DIAGNOSIS — Z9884 Bariatric surgery status: Secondary | ICD-10-CM | POA: Diagnosis not present

## 2017-01-22 NOTE — Telephone Encounter (Signed)
Pt came for a tanita reading.   TANITA  BODY COMP RESULTS  01/22/2017   BMI (kg/m^2) 29.7   Fat Mass (lbs) 80.4   Fat Free Mass (lbs) 115.2   Total Body Water (lbs) 82.4

## 2017-01-27 ENCOUNTER — Ambulatory Visit: Payer: BLUE CROSS/BLUE SHIELD | Admitting: Family Medicine

## 2017-01-27 ENCOUNTER — Encounter: Payer: Self-pay | Admitting: Family Medicine

## 2017-01-27 VITALS — BP 80/68 | HR 66 | Temp 97.7°F | Resp 16 | Ht 68.0 in | Wt 195.2 lb

## 2017-01-27 DIAGNOSIS — M25562 Pain in left knee: Secondary | ICD-10-CM | POA: Diagnosis not present

## 2017-01-27 DIAGNOSIS — R42 Dizziness and giddiness: Secondary | ICD-10-CM

## 2017-01-27 DIAGNOSIS — F419 Anxiety disorder, unspecified: Secondary | ICD-10-CM | POA: Diagnosis not present

## 2017-01-27 DIAGNOSIS — Z9884 Bariatric surgery status: Secondary | ICD-10-CM | POA: Diagnosis not present

## 2017-01-27 LAB — LIPID PANEL
CHOLESTEROL: 150 mg/dL (ref 0–200)
HDL: 40.2 mg/dL (ref >39.00)
LDL CALC: 96 mg/dL (ref 0–99)
NonHDL: 109.96
Total CHOL/HDL Ratio: 4
Triglycerides: 69 mg/dL (ref 0.0–149.0)
VLDL: 13.8 mg/dL (ref 0.0–40.0)

## 2017-01-27 LAB — CBC WITH DIFFERENTIAL/PLATELET
BASOS ABS: 0.1 10*3/uL (ref 0.0–0.1)
Basophils Relative: 0.8 % (ref 0.0–3.0)
EOS ABS: 0.2 10*3/uL (ref 0.0–0.7)
Eosinophils Relative: 1.9 % (ref 0.0–5.0)
HEMATOCRIT: 41.7 % (ref 36.0–46.0)
HEMOGLOBIN: 13.7 g/dL (ref 12.0–15.0)
LYMPHS PCT: 20.3 % (ref 12.0–46.0)
Lymphs Abs: 2.2 10*3/uL (ref 0.7–4.0)
MCHC: 32.9 g/dL (ref 30.0–36.0)
MCV: 85.2 fl (ref 78.0–100.0)
Monocytes Absolute: 0.4 10*3/uL (ref 0.1–1.0)
Monocytes Relative: 4.1 % (ref 3.0–12.0)
Neutro Abs: 7.7 10*3/uL (ref 1.4–7.7)
Neutrophils Relative %: 72.9 % (ref 43.0–77.0)
Platelets: 296 10*3/uL (ref 150.0–400.0)
RBC: 4.89 Mil/uL (ref 3.87–5.11)
RDW: 14.6 % (ref 11.5–15.5)
WBC: 10.6 10*3/uL — AB (ref 4.0–10.5)

## 2017-01-27 LAB — VITAMIN D 25 HYDROXY (VIT D DEFICIENCY, FRACTURES): VITD: 50.56 ng/mL (ref 30.00–100.00)

## 2017-01-27 LAB — FOLATE: Folate: 14.3 ng/mL (ref >5.9)

## 2017-01-27 LAB — COMPREHENSIVE METABOLIC PANEL
ALBUMIN: 4.1 g/dL (ref 3.5–5.2)
ALK PHOS: 87 U/L (ref 39–117)
ALT: 39 U/L — AB (ref 0–35)
AST: 29 U/L (ref 0–37)
BILIRUBIN TOTAL: 0.5 mg/dL (ref 0.2–1.2)
BUN: 24 mg/dL — ABNORMAL HIGH (ref 6–23)
CALCIUM: 9.6 mg/dL (ref 8.4–10.5)
CO2: 30 meq/L (ref 19–32)
CREATININE: 0.71 mg/dL (ref 0.40–1.20)
Chloride: 100 mEq/L (ref 96–112)
GFR: 91.16 mL/min (ref >60.00)
Glucose, Bld: 87 mg/dL (ref 70–99)
Potassium: 4.6 mEq/L (ref 3.5–5.1)
Sodium: 137 mEq/L (ref 135–145)
TOTAL PROTEIN: 7.1 g/dL (ref 6.0–8.3)

## 2017-01-27 LAB — IRON: IRON: 81 ug/dL (ref 42–145)

## 2017-01-27 LAB — TSH: TSH: 1.21 u[IU]/mL (ref 0.35–4.50)

## 2017-01-27 LAB — VITAMIN B12

## 2017-01-27 MED ORDER — CITALOPRAM HYDROBROMIDE 10 MG PO TABS: 30.0000 mg | ORAL_TABLET | Freq: Every day | ORAL | 5 refills | Status: DC

## 2017-01-27 NOTE — Assessment & Plan Note (Signed)
Most likely from extreme low cal / carb diet-- pt has been instructed by nutritionist to inc carb and surgeon to inc cal Pt drinks about 80 oz fluid a day Check labs

## 2017-01-27 NOTE — Patient Instructions (Signed)

## 2017-01-27 NOTE — Progress Notes (Signed)
Patient ID: Caroline Ward, female   DOB: Aug 06, 1960, 57 y.o.   MRN: [id number]    Subjective:  I acted as a Education administrator for Dr. Carollee Herter.  Caroline Ward, Caroline Ward   Patient ID: Caroline Ward, female    DOB: Jun 23, 1960, 57 y.o.   MRN: [id number]  Chief Complaint  Patient presents with  . Anxiety    HPI  Patient is in today for follow up anxiety.  She has been doing well on current treatment. Pt needs labs done for surgeon and has been dizzy --  Surgeon told her she may need to inc her calories some.   Pt also c/o something on her L knee.    Patient Care Team: Carollee Herter, Alferd Apa, DO as PCP - General Regal, Tamala Fothergill, DPM as Consulting Physician (Podiatry) Tanda Rockers, MD as Consulting Physician (Pulmonary Disease)   Past Medical History:  Diagnosis Date  . Anxiety   . Asthma    on inhaler  . Complication of anesthesia   . Depression   . GERD (gastroesophageal reflux disease)   . Heart murmur   . Hemorrhoids    in past  . Migraine   . Pneumonia    3 years ago/has bronchitis often  . UTI (urinary tract infection)    in past    Past Surgical History:  Procedure Laterality Date  . FRACTURE SURGERY     3rd metacarpal  . GASTRIC ROUX-EN-Y N/A 06/30/2016   Procedure: LAPAROSCOPIC ROUX-EN-Y GASTRIC BYPASS WITH UPPER ENDOSCOPY;  Surgeon: Excell Seltzer, MD;  Location: [facility];  Service: General;  Laterality: N/A;  . WISDOM TOOTH EXTRACTION      Family History  Problem Relation Age of Onset  . Lung cancer Father   . Alcohol abuse Father   . Throat cancer Father   . Hyperlipidemia Mother   . COPD Mother   . Hypertension Mother   . Colon polyps Mother   . Pancreatic cancer Brother        pancreas  . Other Sister   . Colon polyps Sister   . Liver disease Maternal Aunt   . Breast cancer Maternal Aunt   . Dementia Maternal Aunt   . Cancer Paternal Uncle        type unbknown  . Other Brother        vein issues  . Asthma Other   . Diabetes Other     Social History     Socioeconomic History  . Marital status: Single    Spouse name: Not on file  . Number of children: 0  . Years of education: Not on file  . Highest education level: Not on file  Social Needs  . Financial resource strain: Not on file  . Food insecurity - worry: Not on file  . Food insecurity - inability: Not on file  . Transportation needs - medical: Not on file  . Transportation needs - non-medical: Not on file  Occupational History  . Occupation: self employed-- Retail banker: [employer]  Tobacco Use  . Smoking status: Never Smoker  . Smokeless tobacco: Never Used  Substance and Sexual Activity  . Alcohol use: No    Alcohol/week: 0.0 oz    Comment: Occ  . Drug use: No  . Sexual activity: Not Currently    Partners: Male  Other Topics Concern  . Not on file  Social History Narrative   Exercise--no    Outpatient Medications Prior to Visit  Medication  Sig Dispense Refill  . acetaminophen (TYLENOL) 500 MG tablet Take 1,000 mg by mouth daily as needed for mild pain.     Marland Kitchen ALPRAZolam (XANAX) 0.25 MG tablet Take 1 tablet (0.25 mg total) by mouth 3 (three) times daily as needed for anxiety. 30 tablet 1  . Biotin 1000 MCG tablet Take 1,000 mcg by mouth 2 (two) times daily.     Marland Kitchen CALCIUM PO Take 1 tablet by mouth 3 (three) times daily.    . Cyanocobalamin (B-12 PO) Take 1 tablet by mouth daily.    . Multiple Vitamin (MULTIVITAMIN) capsule Take 1 capsule by mouth daily.    Marland Kitchen nystatin (MYCOSTATIN/NYSTOP) powder Apply topically 3 (three) times daily. 15 g 0  . Turmeric 500 MG CAPS Take 1 capsule by mouth daily.    . citalopram (CELEXA) 10 MG tablet Take 3 tablets (30 mg total) by mouth daily. 90 tablet 5   No facility-administered medications prior to visit.     Allergies  Allergen Reactions  . Myrbetriq [Mirabegron] Itching    Review of Systems  Constitutional: Negative for fever and malaise/fatigue.  HENT: Negative for congestion.   Eyes: Negative for  blurred vision.  Respiratory: Negative for cough and shortness of breath.   Cardiovascular: Negative for chest pain, palpitations and leg swelling.  Gastrointestinal: Negative for vomiting.  Musculoskeletal: Negative for back pain.  Skin: Negative for rash.  Neurological: Negative for loss of consciousness and headaches.       Objective:    Physical Exam  Constitutional: She is oriented to person, place, and time. She appears well-developed and well-nourished.  HENT:  Head: Normocephalic and atraumatic.  Eyes: Conjunctivae and EOM are normal.  Neck: Normal range of motion. Neck supple. No JVD present. Carotid bruit is not present. No thyromegaly present.  Cardiovascular: Normal rate, regular rhythm and normal heart sounds.  No murmur heard. Pulmonary/Chest: Effort normal and breath sounds normal. No respiratory distress. She has no wheezes. She has no rales. She exhibits no tenderness.  Musculoskeletal: Normal range of motion. She exhibits no edema, tenderness or deformity.  Neurological: She is alert and oriented to person, place, and time.  Psychiatric: She has a normal mood and affect.  Nursing note and vitals reviewed.   BP (!) 80/68 (BP Location: Left Arm, Patient Position: Standing)   Pulse 66   Temp 97.7 F (36.5 C) (Oral)   Resp 16   Ht 5\' 8"  (1.727 m)   Wt 195 lb 3.2 oz (88.5 kg)   LMP 04/29/2014   SpO2 94%   BMI 29.68 kg/m  Wt Readings from Last 3 Encounters:  01/27/17 195 lb 3.2 oz (88.5 kg)  11/11/16 217 lb 9.6 oz (98.7 kg)  10/21/16 227 lb (103 kg)   BP Readings from Last 3 Encounters:  01/27/17 (!) 80/68  10/21/16 100/72  07/02/16 127/66     Immunization History  Administered Date(s) Administered  . Influenza Whole 10/20/2007, 12/13/2009, 11/13/2012  . Influenza,inj,Quad PF,6+ Mos 10/26/2013, 10/21/2016  . Influenza-Unspecified 11/26/2011, 11/13/2013, 11/08/2014  . Pneumococcal Polysaccharide-23 10/26/2013  . Td 10/12/2002  . Tdap 07/21/2014      Health Maintenance  Topic Date Due  . HIV Screening  01/06/1978  . MAMMOGRAM  04/17/2017  . PAP SMEAR  07/20/2017  . TETANUS/TDAP  07/20/2024  . COLONOSCOPY  10/02/2024  . INFLUENZA VACCINE  Completed  . Hepatitis C Screening  Completed    Lab Results  Component Value Date   WBC 9.8 10/21/2016   HGB  13.5 10/21/2016   HCT 41.9 10/21/2016   PLT 269.0 10/21/2016   GLUCOSE 89 10/21/2016   CHOL 151 10/21/2016   TRIG 85.0 10/21/2016   HDL 35.80 (L) 10/21/2016   LDLDIRECT 144.8 05/19/2011   LDLCALC 98 10/21/2016   ALT 31 10/21/2016   AST 25 10/21/2016   NA 140 10/21/2016   K 4.1 10/21/2016   CL 105 10/21/2016   CREATININE 0.71 10/21/2016   BUN 22 10/21/2016   CO2 28 10/21/2016   TSH 1.16 10/21/2016    Lab Results  Component Value Date   TSH 1.16 10/21/2016   Lab Results  Component Value Date   WBC 9.8 10/21/2016   HGB 13.5 10/21/2016   HCT 41.9 10/21/2016   MCV 85.2 10/21/2016   PLT 269.0 10/21/2016   Lab Results  Component Value Date   NA 140 10/21/2016   K 4.1 10/21/2016   CO2 28 10/21/2016   GLUCOSE 89 10/21/2016   BUN 22 10/21/2016   CREATININE 0.71 10/21/2016   BILITOT 0.4 10/21/2016   ALKPHOS 75 10/21/2016   AST 25 10/21/2016   ALT 31 10/21/2016   PROT 6.8 10/21/2016   ALBUMIN 4.0 10/21/2016   CALCIUM 9.4 10/21/2016   ANIONGAP 10 06/24/2016   GFR 91.25 10/21/2016   Lab Results  Component Value Date   CHOL 151 10/21/2016   Lab Results  Component Value Date   HDL 35.80 (L) 10/21/2016   Lab Results  Component Value Date   LDLCALC 98 10/21/2016   Lab Results  Component Value Date   TRIG 85.0 10/21/2016   Lab Results  Component Value Date   CHOLHDL 4 10/21/2016   No results found for: HGBA1C       Assessment & Plan:   Problem List Items Addressed This Visit      Unprioritized   Dizzy    Most likely from extreme low cal / carb diet-- pt has been instructed by nutritionist to inc carb and surgeon to inc cal Pt drinks about 80 oz  fluid a day Check labs        Relevant Orders   CBC with Differential/Platelet   Comprehensive metabolic panel   TSH   Lipid panel   Vitamin D 1,25 dihydroxy   Vitamin B12   Vitamin D (25 hydroxy)   Iron   Vitamin B1   Folate    Other Visit Diagnoses    Status post bariatric surgery    -  Primary   Relevant Orders   CBC with Differential/Platelet   Comprehensive metabolic panel   TSH   Lipid panel   Vitamin D 1,25 dihydroxy   Vitamin B12   Vitamin D (25 hydroxy)   Iron   Vitamin B1   Folate   Left knee pain, unspecified chronicity       Relevant Orders   Ambulatory referral to Sports Medicine   Anxiety       Relevant Medications   citalopram (CELEXA) 10 MG tablet      I am having Caroline Ward maintain her multivitamin, acetaminophen, Biotin, CALCIUM PO, nystatin, ALPRAZolam, Cyanocobalamin (B-12 PO), Turmeric, and citalopram.  Meds ordered this encounter  Medications  . citalopram (CELEXA) 10 MG tablet    Sig: Take 3 tablets (30 mg total) by mouth daily.    Dispense:  90 tablet    Refill:  5    CMA served as scribe during this visit. History, Physical and Plan performed by medical provider. Documentation and orders reviewed  and attested to.  Ann Held, DO

## 2017-01-28 ENCOUNTER — Telehealth: Payer: Self-pay | Admitting: *Deleted

## 2017-01-28 NOTE — Telephone Encounter (Signed)
-----   Message from Ann Held, DO sent at 01/27/2017  6:41 PM EST ----- Slightly dehydrated Vita D low normal--- take vita d3 1000u daily Wbc slightly high---only 0.1 ------ any symptoms of uri or uti etc ---- may need to check UA

## 2017-01-28 NOTE — Telephone Encounter (Signed)
Notified patient of results.  She asked about her b12 level, which we have not been reviewed yet.  Her b12 is >1500.  She is taking a bariatric supplement that has b12 (pt not sure how much is in it) and also she is taking sublingual b12 5000mg .  Would you like her to decrease?

## 2017-01-28 NOTE — Telephone Encounter (Signed)
She can stop the sublingual

## 2017-01-29 ENCOUNTER — Encounter: Payer: Self-pay | Admitting: Family Medicine

## 2017-01-29 ENCOUNTER — Ambulatory Visit: Payer: BLUE CROSS/BLUE SHIELD | Admitting: Family Medicine

## 2017-01-29 DIAGNOSIS — M25562 Pain in left knee: Secondary | ICD-10-CM | POA: Diagnosis not present

## 2017-01-29 NOTE — Patient Instructions (Signed)
Your exam and ultrasound are very reassuring. You have a contusion of the tibial tubercle from repetitive kneeling. The patellar tendon looks good - an insertional tendinitis of this can present similarly but your exam doesn't indicate this is the case. Ice this 15 minutes at a time 3 times a day for 2 weeks. Tylenol only if needed for pain. Knee pad if you're going to be kneeling to help protect this. Straight leg raises, knee extensions 3 sets of 10 once a day for 3-4 weeks. Follow up with me as needed.

## 2017-01-29 NOTE — Assessment & Plan Note (Signed)
2/2 contusion of tibial tubercle from repetitive kneeling.  Exam and ultrasound otherwise reassuring.  Icing, tylenol only if needed.  Encouraged knee pad.  Reviewed home exercises.  F/u prn.

## 2017-01-29 NOTE — Telephone Encounter (Signed)
Patient notified

## 2017-01-29 NOTE — Progress Notes (Signed)
PCP and consultation requested by: Ann Held, DO  Subjective:   HPI: Patient is a 57 y.o. female here for left knee pain.  Patient reports for about 1.5 months she's had pain anterior left knee. No acute injury or trauma. Had been doing a lot of kneeling and only feels pain with direct pressure on this area. Pain 0/10 but up to 6/10 and sharp. No bruising or swelling. Has not tried anything for this. No skin changes, numbness.  Past Medical History:  Diagnosis Date  . Anxiety   . Asthma    on inhaler  . Complication of anesthesia   . Depression   . GERD (gastroesophageal reflux disease)   . Heart murmur   . Hemorrhoids    in past  . Migraine   . Pneumonia    3 years ago/has bronchitis often  . UTI (urinary tract infection)    in past    Current Outpatient Medications on File Prior to Visit  Medication Sig Dispense Refill  . acetaminophen (TYLENOL) 500 MG tablet Take 1,000 mg by mouth daily as needed for mild pain.     Marland Kitchen ALPRAZolam (XANAX) 0.25 MG tablet Take 1 tablet (0.25 mg total) by mouth 3 (three) times daily as needed for anxiety. 30 tablet 1  . Biotin 1000 MCG tablet Take 1,000 mcg by mouth 2 (two) times daily.     Marland Kitchen CALCIUM PO Take 1 tablet by mouth 3 (three) times daily.    . citalopram (CELEXA) 10 MG tablet Take 3 tablets (30 mg total) by mouth daily. 90 tablet 5  . Cyanocobalamin (B-12 PO) Take 1 tablet by mouth daily.    . Multiple Vitamin (MULTIVITAMIN) capsule Take 1 capsule by mouth daily.    Marland Kitchen nystatin (MYCOSTATIN/NYSTOP) powder Apply topically 3 (three) times daily. 15 g 0  . Turmeric 500 MG CAPS Take 1 capsule by mouth daily.     No current facility-administered medications on file prior to visit.     Past Surgical History:  Procedure Laterality Date  . FRACTURE SURGERY     3rd metacarpal  . GASTRIC ROUX-EN-Y N/A 06/30/2016   Procedure: LAPAROSCOPIC ROUX-EN-Y GASTRIC BYPASS WITH UPPER ENDOSCOPY;  Surgeon: Excell Seltzer, MD;   Location: [facility];  Service: General;  Laterality: N/A;  . WISDOM TOOTH EXTRACTION      Allergies  Allergen Reactions  . Myrbetriq [Mirabegron] Itching    Social History   Socioeconomic History  . Marital status: Single    Spouse name: Not on file  . Number of children: 0  . Years of education: Not on file  . Highest education level: Not on file  Social Needs  . Financial resource strain: Not on file  . Food insecurity - worry: Not on file  . Food insecurity - inability: Not on file  . Transportation needs - medical: Not on file  . Transportation needs - non-medical: Not on file  Occupational History  . Occupation: self employed-- Retail banker: [employer]  Tobacco Use  . Smoking status: Never Smoker  . Smokeless tobacco: Never Used  Substance and Sexual Activity  . Alcohol use: No    Alcohol/week: 0.0 oz    Comment: Occ  . Drug use: No  . Sexual activity: Not Currently    Partners: Male  Other Topics Concern  . Not on file  Social History Narrative   Exercise--no    Family History  Problem Relation Age of Onset  . Lung  cancer Father   . Alcohol abuse Father   . Throat cancer Father   . Hyperlipidemia Mother   . COPD Mother   . Hypertension Mother   . Colon polyps Mother   . Pancreatic cancer Brother        pancreas  . Other Sister   . Colon polyps Sister   . Liver disease Maternal Aunt   . Breast cancer Maternal Aunt   . Dementia Maternal Aunt   . Cancer Paternal Uncle        type unbknown  . Other Brother        vein issues  . Asthma Other   . Diabetes Other     BP 110/66   Pulse 64   Ht 5\' 8"  (1.727 m)   Wt 195 lb (88.5 kg)   LMP 04/29/2014   BMI 29.65 kg/m   Review of Systems: See HPI above.     Objective:  Physical Exam:  Gen: NAD, comfortable in exam room  Left knee: No gross deformity, ecchymoses, swelling. Mild TTP tibial tubercle. FROM with 5/5 strength and no pain. Negative ant/post drawers. Negative  valgus/varus testing. Negative lachmanns. Negative mcmurrays, apleys, patellar apprehension. NV intact distally.  Right knee: No deformity. FROM with 5/5 strength. No tenderness to palpation. NVI distally.   MSK u/s left knee: Patellar tendon intact without tears, neovascularity at insertion on tibial tubercle.  Assessment & Plan:  1. Left knee pain - 2/2 contusion of tibial tubercle from repetitive kneeling.  Exam and ultrasound otherwise reassuring.  Icing, tylenol only if needed.  Encouraged knee pad.  Reviewed home exercises.  F/u prn.

## 2017-01-31 LAB — VITAMIN D 1,25 DIHYDROXY
VITAMIN D 1, 25 (OH) TOTAL: 33 pg/mL (ref 18–72)
Vitamin D2 1, 25 (OH)2: <8 pg/mL
Vitamin D3 1, 25 (OH)2: 33 pg/mL

## 2017-01-31 LAB — VITAMIN B1: Vitamin B1 (Thiamine): 18 nmol/L (ref 8–30)

## 2017-02-03 ENCOUNTER — Encounter: Payer: Self-pay | Admitting: *Deleted

## 2017-02-26 ENCOUNTER — Ambulatory Visit: Payer: BLUE CROSS/BLUE SHIELD | Admitting: Skilled Nursing Facility1

## 2017-03-03 ENCOUNTER — Encounter: Payer: Self-pay | Admitting: Skilled Nursing Facility1

## 2017-03-03 ENCOUNTER — Encounter: Payer: BLUE CROSS/BLUE SHIELD | Attending: General Surgery | Admitting: Skilled Nursing Facility1

## 2017-03-03 DIAGNOSIS — Z713 Dietary counseling and surveillance: Secondary | ICD-10-CM | POA: Diagnosis not present

## 2017-03-03 DIAGNOSIS — Z6841 Body Mass Index (BMI) 40.0 and over, adult: Secondary | ICD-10-CM

## 2017-03-03 NOTE — Patient Instructions (Signed)
[email or web address]   

## 2017-03-03 NOTE — Progress Notes (Signed)
Post-Operative RYGB Surgery Primary concerns today: Post-operative Bariatric Surgery Nutrition Management.  Pt states chia and flax has helped her with bowel movements. Pt states she has been craving sweets. 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 Pt states he wants to work with a  Mental health provider for emotional eating. Pt states she does not have time to eat at work because she never knows when a customer is going to come in so she eats fast which causes her stomach to hurt (food ideas were offered and accepted).   Surgery date: 06/30/2016 Surgery type: RYGB Start weight at [facility]: 289.8 Weight today: 188.8 Weight change: 28.8  TANITA  BODY COMP RESULTS  07/17/2016 08/26/2016 11/11/2016   BMI (kg/m^2) 41.8 39.2 34.1   Fat Mass (lbs) 142.4 126 101.2   Fat Free Mass (lbs) 124.4 124.6 116.4   Total Body Water (lbs) 91.6 91.2 84   01/22/2017 03/03/2017  29.7 28.7  80.4 75.6  115.2 113.2  82.4 80.6    24-hr recall: B (AM): 1 egg 2 slices bacon Snk (AM): whole protein shake L (PM): shake or salad Snk (PM):  Half a shake or protein bars D (PM): 3 ounces of meat and broccoli Snk (PM):   Fluid intake: 1-1.5 shakes, water: 70 fluid ounces 90% of the time  Estimated total protein intake: 100 g  Medications: See List Supplementation: multivitamin procare health capsule and 3 calcium and vitamin D and tumeric and probiotic  Using straws: no Drinking while eating: no Having you been chewing well:no Chewing/swallowing difficulties: no Changes in vision: no Changes to mood/headaches: no Hair loss/Cahnges to skin/Changes to nails: no Any difficulty focusing or concentrating: no Sweating: no Dizziness/Lightheaded: no Palpitations: no  Carbonated beverages: no N/V/D/C/GAS: having a bowel movement every day to every other day Abdominal Pain: no Dumping syndrome: no  Recent physical activity:  Walking 60 minutes  2-3 days a week  Progress Towards Goal(s):  In progress.  Handouts given during visit include:  Non starchy veggies + protein   Nutritional Diagnosis:  [place]-3.3 Overweight/obesity related to past poor dietary habits and physical inactivity as evidenced by patient w/ recent RYGB surgery following dietary guidelines for continued weight loss.  Intervention:  Nutrition counseling. Dietitian educated the pt on advancing her diet to include non-starchy veggies. Goals: -Have some fruit before you workout and before the hot tub -Have vegetables with every lunch and every dinner -Try the kefir -Use your needs and emotions sheet to identify emotional eating -[email or web address]-[email or web address]/ Teaching Method Utilized:  Visual Auditory Hands on  Barriers to learning/adherence to lifestyle change: none identified   Demonstrated degree of understanding via:  Teach Back   Monitoring/Evaluation:  Dietary intake, exercise, and body weight.

## 2017-04-01 ENCOUNTER — Other Ambulatory Visit: Payer: Self-pay | Admitting: Family Medicine

## 2017-04-01 DIAGNOSIS — Z1231 Encounter for screening mammogram for malignant neoplasm of breast: Secondary | ICD-10-CM

## 2017-04-22 ENCOUNTER — Ambulatory Visit (HOSPITAL_BASED_OUTPATIENT_CLINIC_OR_DEPARTMENT_OTHER)
Admission: RE | Admit: 2017-04-22 | Discharge: 2017-04-22 | Disposition: A | Discharge status: Home / Self Care | Payer: BLUE CROSS/BLUE SHIELD | Source: Ambulatory Visit | Attending: Family Medicine | Admitting: Family Medicine

## 2017-04-22 DIAGNOSIS — Z1231 Encounter for screening mammogram for malignant neoplasm of breast: Secondary | ICD-10-CM | POA: Insufficient documentation

## 2017-06-09 DIAGNOSIS — Z9884 Bariatric surgery status: Secondary | ICD-10-CM | POA: Diagnosis not present

## 2017-06-12 DIAGNOSIS — W19XXXA Unspecified fall, initial encounter: Secondary | ICD-10-CM | POA: Diagnosis not present

## 2017-06-12 DIAGNOSIS — M25521 Pain in right elbow: Secondary | ICD-10-CM | POA: Diagnosis not present

## 2017-06-13 ENCOUNTER — Other Ambulatory Visit: Payer: Self-pay | Admitting: Family Medicine

## 2017-06-13 DIAGNOSIS — F411 Generalized anxiety disorder: Secondary | ICD-10-CM

## 2017-06-15 NOTE — Telephone Encounter (Signed)
Requesting:XANAX 0.25 MG Contract: 06/10/12 UDS:06/10/12 Last OV: 01/27/17 Next OV: -- Last Refill: 12/11/16   Please advise

## 2017-06-16 ENCOUNTER — Ambulatory Visit: Payer: BLUE CROSS/BLUE SHIELD | Admitting: Family Medicine

## 2017-06-16 ENCOUNTER — Encounter: Payer: Self-pay | Admitting: Family Medicine

## 2017-06-16 VITALS — BP 106/60 | HR 66 | Temp 98.2°F | Resp 16 | Wt 180.2 lb

## 2017-06-16 DIAGNOSIS — E162 Hypoglycemia, unspecified: Secondary | ICD-10-CM

## 2017-06-16 DIAGNOSIS — E559 Vitamin D deficiency, unspecified: Secondary | ICD-10-CM | POA: Diagnosis not present

## 2017-06-16 DIAGNOSIS — S5001XA Contusion of right elbow, initial encounter: Secondary | ICD-10-CM

## 2017-06-16 NOTE — Progress Notes (Signed)
Subjective:  I acted as a Education administrator for [employer]. Yancey Flemings, [place]   Patient ID: Caroline Ward, female    DOB: 05/10/1960, 57 y.o.   MRN: [id number]  Chief Complaint  Patient presents with  . Elbow Pain    HPI  Patient is in today for right elbow pain.  She fell in her house-- tripped over her own feet.  She went to an urgent care and xray was done-- neg for fracture.  It is progressively getting better but pt wanted [place] to recheck it and make sure it was ok.    Patient Care Team: Carollee Herter, Alferd Apa, DO as PCP - General Regal, Tamala Fothergill, DPM as Consulting Physician (Podiatry) Tanda Rockers, MD as Consulting Physician (Pulmonary Disease) Excell Seltzer, MD as Consulting Physician (General Surgery)   Past Medical History:  Diagnosis Date  . Anxiety   . Asthma    on inhaler  . Complication of anesthesia   . Depression   . GERD (gastroesophageal reflux disease)   . Heart murmur   . Hemorrhoids    in past  . Migraine   . Pneumonia    3 years ago/has bronchitis often  . UTI (urinary tract infection)    in past    Past Surgical History:  Procedure Laterality Date  . FRACTURE SURGERY     3rd metacarpal  . GASTRIC ROUX-EN-Y N/A 06/30/2016   Procedure: LAPAROSCOPIC ROUX-EN-Y GASTRIC BYPASS WITH UPPER ENDOSCOPY;  Surgeon: Excell Seltzer, MD;  Location: [facility];  Service: General;  Laterality: N/A;  . WISDOM TOOTH EXTRACTION      Family History  Problem Relation Age of Onset  . Lung cancer Father   . Alcohol abuse Father   . Throat cancer Father   . Hyperlipidemia Mother   . COPD Mother   . Hypertension Mother   . Colon polyps Mother   . Pancreatic cancer Brother        pancreas  . Other Sister   . Colon polyps Sister   . Liver disease Maternal Aunt   . Breast cancer Maternal Aunt   . Dementia Maternal Aunt   . Cancer Paternal Uncle        type unbknown  . Other Brother        vein issues  . Asthma Other   . Diabetes Other     Social History    Socioeconomic History  . Marital status: Single    Spouse name: Not on file  . Number of children: 0  . Years of education: Not on file  . Highest education level: Not on file  Occupational History  . Occupation: self employed-- Retail banker: [employer]  Social Needs  . Financial resource strain: Not on file  . Food insecurity:    Worry: Not on file    Inability: Not on file  . Transportation needs:    Medical: Not on file    Non-medical: Not on file  Tobacco Use  . Smoking status: Never Smoker  . Smokeless tobacco: Never Used  Substance and Sexual Activity  . Alcohol use: No    Alcohol/week: 0.0 oz    Comment: Occ  . Drug use: No  . Sexual activity: Not Currently    Partners: Male  Lifestyle  . Physical activity:    Days per week: Not on file    Minutes per session: Not on file  . Stress: Not on file  Relationships  . Social connections:  Talks on phone: Not on file    Gets together: Not on file    Attends religious service: Not on file    Active member of club or organization: Not on file    Attends meetings of clubs or organizations: Not on file    Relationship status: Not on file  . Intimate partner violence:    Fear of current or ex partner: Not on file    Emotionally abused: Not on file    Physically abused: Not on file    Forced sexual activity: Not on file  Other Topics Concern  . Not on file  Social History Narrative   Exercise--no    Outpatient Medications Prior to Visit  Medication Sig Dispense Refill  . acetaminophen (TYLENOL) 500 MG tablet Take 1,000 mg by mouth daily as needed for mild pain.     Marland Kitchen ALPRAZolam (XANAX) 0.25 MG tablet TAKE 1 TABLET(0.25 MG) BY MOUTH THREE TIMES DAILY AS NEEDED FOR ANXIETY 30 tablet 0  . CALCIUM PO Take 1 tablet by mouth 3 (three) times daily.    . citalopram (CELEXA) 10 MG tablet Take 3 tablets (30 mg total) by mouth daily. 90 tablet 5  . Cyanocobalamin (B-12 PO) Take 1 tablet by mouth  daily.    . Multiple Vitamin (MULTIVITAMIN) capsule Take 1 capsule by mouth daily.    Marland Kitchen nystatin (MYCOSTATIN/NYSTOP) powder Apply topically 3 (three) times daily. 15 g 0  . Turmeric 500 MG CAPS Take 1 capsule by mouth daily.    . Biotin 1000 MCG tablet Take 1,000 mcg by mouth 2 (two) times daily.      No facility-administered medications prior to visit.     Allergies  Allergen Reactions  . Myrbetriq [Mirabegron] Itching    Review of Systems  Constitutional: Negative for chills, fever and malaise/fatigue.  HENT: Negative for congestion and hearing loss.   Eyes: Negative for discharge.  Respiratory: Negative for cough, sputum production and shortness of breath.   Cardiovascular: Negative for chest pain, palpitations and leg swelling.  Gastrointestinal: Negative for abdominal pain, blood in stool, constipation, diarrhea, heartburn, nausea and vomiting.  Genitourinary: Negative for dysuria, frequency, hematuria and urgency.  Musculoskeletal: Positive for joint pain. Negative for back pain, falls and myalgias.  Skin: Negative for rash.  Neurological: Negative for dizziness, sensory change, loss of consciousness, weakness and headaches.  Endo/Heme/Allergies: Negative for environmental allergies. Does not bruise/bleed easily.  Psychiatric/Behavioral: Negative for depression and suicidal ideas. The patient is not nervous/anxious and does not have insomnia.        Objective:    Physical Exam  Musculoskeletal: She exhibits tenderness.       Right elbow: She exhibits normal range of motion and no swelling. Tenderness found.       Arms: Nursing note and vitals reviewed.   BP 106/60 (BP Location: Left Arm, Patient Position: Sitting, Cuff Size: Normal)   Pulse 66   Temp 98.2 F (36.8 C) (Oral)   Resp 16   Wt 180 lb 3.2 oz (81.7 kg)   LMP 04/29/2014   SpO2 98%   BMI 27.40 kg/m  Wt Readings from Last 3 Encounters:  06/16/17 180 lb 3.2 oz (81.7 kg)  03/03/17 188 lb 12.8 oz (85.6 kg)   01/29/17 195 lb (88.5 kg)   BP Readings from Last 3 Encounters:  06/16/17 106/60  01/29/17 110/66  01/27/17 (!) 80/68     Immunization History  Administered Date(s) Administered  . Influenza Whole 10/20/2007, 12/13/2009, 11/13/2012  .  Influenza,inj,Quad PF,6+ Mos 10/26/2013, 10/21/2016  . Influenza-Unspecified 11/26/2011, 11/13/2013, 11/08/2014  . Pneumococcal Polysaccharide-23 10/26/2013  . Td 10/12/2002  . Tdap 07/21/2014    Health Maintenance  Topic Date Due  . HIV Screening  01/06/1978  . PAP SMEAR  07/20/2017  . INFLUENZA VACCINE  08/13/2017  . MAMMOGRAM  04/23/2018  . TETANUS/TDAP  07/20/2024  . COLONOSCOPY  10/02/2024  . Hepatitis C Screening  Completed    Lab Results  Component Value Date   WBC 10.6 (H) 01/27/2017   HGB 13.7 01/27/2017   HCT 41.7 01/27/2017   PLT 296.0 01/27/2017   GLUCOSE 87 01/27/2017   CHOL 150 01/27/2017   TRIG 69.0 01/27/2017   HDL 40.20 01/27/2017   LDLDIRECT 144.8 05/19/2011   LDLCALC 96 01/27/2017   ALT 39 (H) 01/27/2017   AST 29 01/27/2017   NA 137 01/27/2017   K 4.6 01/27/2017   CL 100 01/27/2017   CREATININE 0.71 01/27/2017   BUN 24 (H) 01/27/2017   CO2 30 01/27/2017   TSH 1.21 01/27/2017    Lab Results  Component Value Date   TSH 1.21 01/27/2017   Lab Results  Component Value Date   WBC 10.6 (H) 01/27/2017   HGB 13.7 01/27/2017   HCT 41.7 01/27/2017   MCV 85.2 01/27/2017   PLT 296.0 01/27/2017   Lab Results  Component Value Date   NA 137 01/27/2017   K 4.6 01/27/2017   CO2 30 01/27/2017   GLUCOSE 87 01/27/2017   BUN 24 (H) 01/27/2017   CREATININE 0.71 01/27/2017   BILITOT 0.5 01/27/2017   ALKPHOS 87 01/27/2017   AST 29 01/27/2017   ALT 39 (H) 01/27/2017   PROT 7.1 01/27/2017   ALBUMIN 4.1 01/27/2017   CALCIUM 9.6 01/27/2017   ANIONGAP 10 06/24/2016   GFR 91.16 01/27/2017   Lab Results  Component Value Date   CHOL 150 01/27/2017   Lab Results  Component Value Date   HDL 40.20 01/27/2017    Lab Results  Component Value Date   LDLCALC 96 01/27/2017   Lab Results  Component Value Date   TRIG 69.0 01/27/2017   Lab Results  Component Value Date   CHOLHDL 4 01/27/2017   No results found for: HGBA1C       Assessment & Plan:   Problem List Items Addressed This Visit    None    Visit Diagnoses    Contusion of right elbow, initial encounter    -  Primary   Hypoglycemia, unspecified       Relevant Orders   CBC with Differential/Platelet   Vitamin B1   Iron   Vitamin B12   Folate   Comprehensive metabolic panel   Vitamin D 1,25 dihydroxy   Vitamin D (25 hydroxy)    warm compresses If it worsens or fails to improve let us know and we will refer to sport med Urgent care records reviewed  I have discontinued Glynn A. Stalling's Biotin. I am also having her maintain her multivitamin, acetaminophen, CALCIUM PO, nystatin, Cyanocobalamin (B-12 PO), Turmeric, citalopram, and ALPRAZolam.  No orders of the defined types were placed in this encounter.    CMA served as Education administrator during this visit. History, Physical and Plan performed by medical provider. Documentation and orders reviewed and attested to.  Ann Held, DO.

## 2017-06-16 NOTE — Patient Instructions (Signed)
Elbow Contusion An elbow contusion is a deep bruise of the elbow. Contusions are the result of a blunt injury to tissues and muscle fibers under the skin. The injury causes bleeding under the skin. The skin overlying the contusion may turn blue, purple, or yellow. Minor injuries will give you a painless contusion, but more severe contusions may stay painful and swollen for a few weeks. What are the causes? This condition is usually caused by a hard hit, trauma, or direct force on the elbow. What are the signs or symptoms? Symptoms of this condition include:  Swelling of the elbow.  Pain and tenderness of the elbow.  Discoloration of the elbow. The area may have redness and then turn blue, purple, or yellow.  How is this diagnosed? This condition is diagnosed from a physical exam and your medical history. An X-ray may be needed to determine if there are any associated injuries, such as broken bones (fractures). How is this treated? A sling or splint may be needed to support your injury. In general, the best treatment for this condition includes rest, ice, pressure (compression), and elevation. This is often called RICE therapy. Over-the-counter anti-inflammatory medicines may also be recommended for pain control. You may also be shown how to do range-of-motion exercises. Follow these instructions at home: RICE Therapy  Rest the injured area.  If directed, apply ice to the injured area: ? Put ice in a plastic bag. ? Place a towel between your skin and the bag. ? Leave the ice on for 20 minutes, 2-3 times per day.  If directed, apply light compression to the injured area using an elastic bandage. Make sure the bandage is not wrapped too tightly. Remove and reapply the bandage as directed by your health care provider.  Raise (elevate) the injured area above the level of your heart while you are sitting or lying down. If you have a splint:  Wear the splint as told by your health care  provider. Remove it only as told by your health care provider.  Loosen the splint if your fingers tingle, become numb, or turn cold and blue.  Do not let your splint get wet if it is not waterproof.  If your splint is not waterproof, cover it with a watertight plastic bag when you take a bath or a shower.  Keep the splint clean. General instructions  Wear your sling as told by your health care provider, if this applies.  Use your elbow only as told by your health care provider. You may be asked to do range-of-motion exercises. Do them as told.  Take over-the-counter and prescription medicines only as told by your health care provider.  Keep all follow-up visits as told by your health care provider. This is important. Contact a health care provider if:  Your symptoms do not improve after several days of treatment.  You have more redness, swelling, or pain in your elbow.  You have difficulty moving the injured area.  Your swelling or pain is not relieved with medicines. Get help right away if:  You have severe pain.  You have numbness in your hand or fingers.  Your hand or fingers turn pale or cold.  You have swelling of your hand and fingers.  You cannot move your fingers or wrist. This information is not intended to replace advice given to you by your health care provider. Make sure you discuss any questions you have with your health care provider. Document Released: 12/08/2005 Document Revised: 06/07/2015 Document   Reviewed: 08/14/2014 Elsevier Interactive Patient Education  2018 [employer]vier Inc[employer]  

## 2017-06-17 LAB — CBC WITH DIFFERENTIAL/PLATELET
BASOS PCT: 1.3 % (ref 0.0–3.0)
Basophils Absolute: 0.1 10*3/uL (ref 0.0–0.1)
Eosinophils Absolute: 0.2 10*3/uL (ref 0.0–0.7)
Eosinophils Relative: 1.9 % (ref 0.0–5.0)
HCT: 39.4 % (ref 36.0–46.0)
HEMOGLOBIN: 13.2 g/dL (ref 12.0–15.0)
LYMPHS ABS: 2.4 10*3/uL (ref 0.7–4.0)
Lymphocytes Relative: 24.6 % (ref 12.0–46.0)
MCHC: 33.5 g/dL (ref 30.0–36.0)
MCV: 84.9 fl (ref 78.0–100.0)
MONOS PCT: 5.3 % (ref 3.0–12.0)
Monocytes Absolute: 0.5 10*3/uL (ref 0.1–1.0)
NEUTROS PCT: 66.9 % (ref 43.0–77.0)
Neutro Abs: 6.6 10*3/uL (ref 1.4–7.7)
Platelets: 245 10*3/uL (ref 150.0–400.0)
RBC: 4.64 Mil/uL (ref 3.87–5.11)
RDW: 13.8 % (ref 11.5–15.5)
WBC: 9.9 10*3/uL (ref 4.0–10.5)

## 2017-06-17 LAB — VITAMIN B12: Vitamin B-12: >1500 pg/mL — ABNORMAL HIGH (ref 211–911)

## 2017-06-17 LAB — COMPREHENSIVE METABOLIC PANEL
ALBUMIN: 4.1 g/dL (ref 3.5–5.2)
ALT: 39 U/L — ABNORMAL HIGH (ref 0–35)
AST: 30 U/L (ref 0–37)
Alkaline Phosphatase: 94 U/L (ref 39–117)
BUN: 30 mg/dL — AB (ref 6–23)
CHLORIDE: 103 meq/L (ref 96–112)
CO2: 29 mEq/L (ref 19–32)
Calcium: 9.7 mg/dL (ref 8.4–10.5)
Creatinine, Ser: 0.67 mg/dL (ref 0.40–1.20)
GFR: 97.33 mL/min (ref >60.00)
GLUCOSE: 57 mg/dL — AB (ref 70–99)
POTASSIUM: 4 meq/L (ref 3.5–5.1)
SODIUM: 139 meq/L (ref 135–145)
Total Bilirubin: 0.4 mg/dL (ref 0.2–1.2)
Total Protein: 6.5 g/dL (ref 6.0–8.3)

## 2017-06-17 LAB — FOLATE: Folate: 10.6 ng/mL (ref >5.9)

## 2017-06-17 LAB — IRON: Iron: 66 ug/dL (ref 42–145)

## 2017-06-17 LAB — VITAMIN D 25 HYDROXY (VIT D DEFICIENCY, FRACTURES): VITD: 58 ng/mL (ref 30.00–100.00)

## 2017-06-20 LAB — VITAMIN D 1,25 DIHYDROXY
VITAMIN D3 1, 25 (OH): 35 pg/mL
Vitamin D 1, 25 (OH)2 Total: 35 pg/mL (ref 18–72)
Vitamin D2 1, 25 (OH)2: <8 pg/mL

## 2017-06-20 LAB — VITAMIN B1: VITAMIN B1 (THIAMINE): 124 nmol/L — AB (ref 8–30)

## 2017-06-22 ENCOUNTER — Encounter: Payer: Self-pay | Admitting: Family Medicine

## 2017-06-22 ENCOUNTER — Telehealth: Payer: Self-pay | Admitting: *Deleted

## 2017-06-22 DIAGNOSIS — S5001XA Contusion of right elbow, initial encounter: Secondary | ICD-10-CM

## 2017-06-22 NOTE — Telephone Encounter (Signed)
Copied from [place] [id number]. Topic: General - Other >> Jun 22, 2017  8:12 AM Carolyn Stare wrote:  Pt call to say Dr Carollee Herter said if her arm was not better to cal land she would refer her.She call today to say it is not better   [phone]

## 2017-06-23 ENCOUNTER — Telehealth: Payer: Self-pay | Admitting: *Deleted

## 2017-06-23 ENCOUNTER — Encounter: Payer: Self-pay | Admitting: *Deleted

## 2017-06-23 NOTE — Telephone Encounter (Signed)
Patient notified that referral has been sent to sports [facility], Dr. Felipe Drone.

## 2017-06-23 NOTE — Telephone Encounter (Signed)
See phone note.  Referral sent.

## 2017-06-23 NOTE — Telephone Encounter (Signed)
Referral put in for Dr. Felipe Drone

## 2017-06-23 NOTE — Telephone Encounter (Signed)
Copied from [place] [id number]. Topic: Quick Communication - See Telephone Encounter >> Jun 23, 2017  8:38 AM Antonieta Iba C wrote: CRM for notification. See Telephone encounter for: 06/23/17.  Pt says that she need to know the name of the Dr that PCP would like for her to see for her arm pain. Pt says that she dont necessarily need a referral but in previous visit PCP spoke of a provider.   Please advise.    CB: [phone]

## 2017-06-24 ENCOUNTER — Ambulatory Visit: Payer: BLUE CROSS/BLUE SHIELD | Admitting: Family Medicine

## 2017-06-24 VITALS — BP 106/58 | Ht 68.0 in | Wt 175.0 lb

## 2017-06-24 DIAGNOSIS — M25521 Pain in right elbow: Secondary | ICD-10-CM

## 2017-06-24 DIAGNOSIS — S59901A Unspecified injury of right elbow, initial encounter: Secondary | ICD-10-CM

## 2017-06-24 NOTE — Patient Instructions (Signed)
Get the repeat x-rays of your elbow. We will contact you tomorrow with the results and next steps. If these look good you have an elbow contusion and forearm strain (primarily of supinator). Icing or heat, whichever feels better at this point 15 minutes at a time 3-4 times a day. Consider topical voltaren gel (prescription) or aspercreme up to 4 times a day. Wrist brace may be helpful as might elbow sleeve. We may go ahead with physical therapy also dependent on the x-rays. Follow up will depend on x-rays

## 2017-06-25 ENCOUNTER — Ambulatory Visit (HOSPITAL_BASED_OUTPATIENT_CLINIC_OR_DEPARTMENT_OTHER)
Admission: RE | Admit: 2017-06-25 | Discharge: 2017-06-25 | Disposition: A | Discharge status: Home / Self Care | Payer: BLUE CROSS/BLUE SHIELD | Source: Ambulatory Visit | Attending: Family Medicine | Admitting: Family Medicine

## 2017-06-25 DIAGNOSIS — M25521 Pain in right elbow: Secondary | ICD-10-CM | POA: Diagnosis not present

## 2017-06-25 DIAGNOSIS — M25421 Effusion, right elbow: Secondary | ICD-10-CM | POA: Diagnosis not present

## 2017-06-26 ENCOUNTER — Encounter: Payer: Self-pay | Admitting: Family Medicine

## 2017-06-26 DIAGNOSIS — S59901A Unspecified injury of right elbow, initial encounter: Secondary | ICD-10-CM | POA: Insufficient documentation

## 2017-06-26 NOTE — Assessment & Plan Note (Signed)
report from initial x-rays negative but images not available for review.  Repeating radiographs today to reassess given her lack of extension, bony tenderness.  If these look good plan to start physical therapy.  Discussed wrist brace, elbow sleeve.  Voltaren gel or aspercreme up to 4 times a day, ice/heat.

## 2017-06-26 NOTE — Progress Notes (Addendum)
PCP and consultation requested by: Ann Held, DO  Subjective:   HPI: Patient is a 57 y.o. female here for right elbow injury.  Patient reports 1.5 weeks ago she was in her house, fell down onto right side hitting counter then the ground. She went to urgent care and had x-rays that were negative. She's continue to struggle with pain lateral elbow into forearm. Has been using ice, ace wrap. No numbness/tingling into forearm or hand. Feels weak. She has to use arms a lot at work and worse with this. Pain is sharp. No skin changes.  Past Medical History:  Diagnosis Date  . Anxiety   . Asthma    on inhaler  . Complication of anesthesia   . Depression   . GERD (gastroesophageal reflux disease)   . Heart murmur   . Hemorrhoids    in past  . Migraine   . Pneumonia    3 years ago/has bronchitis often  . UTI (urinary tract infection)    in past    Current Outpatient Medications on File Prior to Visit  Medication Sig Dispense Refill  . acetaminophen (TYLENOL) 500 MG tablet Take 1,000 mg by mouth daily as needed for mild pain.     Marland Kitchen ALPRAZolam (XANAX) 0.25 MG tablet TAKE 1 TABLET(0.25 MG) BY MOUTH THREE TIMES DAILY AS NEEDED FOR ANXIETY 30 tablet 0  . CALCIUM PO Take 1 tablet by mouth 3 (three) times daily.    . citalopram (CELEXA) 10 MG tablet Take 3 tablets (30 mg total) by mouth daily. 90 tablet 5  . Cyanocobalamin (B-12 PO) Take 1 tablet by mouth daily.    . Multiple Vitamin (MULTIVITAMIN) capsule Take 1 capsule by mouth daily.    Marland Kitchen nystatin (MYCOSTATIN/NYSTOP) powder Apply topically 3 (three) times daily. 15 g 0  . Turmeric 500 MG CAPS Take 1 capsule by mouth daily.    . Biotin 1000 MCG tablet Take by mouth.    . Omega-3 Fatty Acids (FISH OIL) 500 MG CAPS Take by mouth.     No current facility-administered medications on file prior to visit.     Past Surgical History:  Procedure Laterality Date  . FRACTURE SURGERY     3rd metacarpal  . GASTRIC ROUX-EN-Y N/A  06/30/2016   Procedure: LAPAROSCOPIC ROUX-EN-Y GASTRIC BYPASS WITH UPPER ENDOSCOPY;  Surgeon: Excell Seltzer, MD;  Location: [facility];  Service: General;  Laterality: N/A;  . WISDOM TOOTH EXTRACTION      Allergies  Allergen Reactions  . Myrbetriq [Mirabegron] Itching    Social History   Socioeconomic History  . Marital status: Single    Spouse name: Not on file  . Number of children: 0  . Years of education: Not on file  . Highest education level: Not on file  Occupational History  . Occupation: self employed-- Retail banker: [employer]  Social Needs  . Financial resource strain: Not on file  . Food insecurity:    Worry: Not on file    Inability: Not on file  . Transportation needs:    Medical: Not on file    Non-medical: Not on file  Tobacco Use  . Smoking status: Never Smoker  . Smokeless tobacco: Never Used  Substance and Sexual Activity  . Alcohol use: No    Alcohol/week: 0.0 oz    Comment: Occ  . Drug use: No  . Sexual activity: Not Currently    Partners: Male  Lifestyle  . Physical activity:  Days per week: Not on file    Minutes per session: Not on file  . Stress: Not on file  Relationships  . Social connections:    Talks on phone: Not on file    Gets together: Not on file    Attends religious service: Not on file    Active member of club or organization: Not on file    Attends meetings of clubs or organizations: Not on file    Relationship status: Not on file  . Intimate partner violence:    Fear of current or ex partner: Not on file    Emotionally abused: Not on file    Physically abused: Not on file    Forced sexual activity: Not on file  Other Topics Concern  . Not on file  Social History Narrative   Exercise--no    Family History  Problem Relation Age of Onset  . Lung cancer Father   . Alcohol abuse Father   . Throat cancer Father   . Hyperlipidemia Mother   . COPD Mother   . Hypertension Mother   . Colon polyps  Mother   . Pancreatic cancer Brother        pancreas  . Other Sister   . Colon polyps Sister   . Liver disease Maternal Aunt   . Breast cancer Maternal Aunt   . Dementia Maternal Aunt   . Cancer Paternal Uncle        type unbknown  . Other Brother        vein issues  . Asthma Other   . Diabetes Other     BP (!) 106/58   Ht 5\' 8"  (1.727 m)   Wt 175 lb (79.4 kg)   LMP 04/29/2014   BMI 26.61 kg/m   Review of Systems: See HPI above.     Objective:  Physical Exam:  Gen: NAD, comfortable in exam room  Right elbow: No gross deformity, bruising.  Mild swelling laterally.   Lacks about 5 degrees extension.  Full flexion.  5/5 strength including wrist extension, 3rd digit extension, finger abduction. TTP extensors of forearm, lateral epicondyle primarily.  Minimal radial head tenderness.  No other tenderness. Guarding with collateral ligament testing but no laxity appreciated. NVI distally.  Left elbow: No deformity. FROM with 5/5 strength. No tenderness to palpation. NVI distally.   Assessment & Plan:  1. Right elbow injury - report from initial x-rays negative but images not available for review.  Repeating radiographs today to reassess given her lack of extension, bony tenderness.  If these look good plan to start physical therapy.  Discussed wrist brace, elbow sleeve.  Voltaren gel or aspercreme up to 4 times a day, ice/heat.  Addendum:  Repeat radiographs reviewed and discussed with patient.  No evidence fracture.  Questionable increased joint fluid.  She feels much better with wrist brace since Wednesday.  She is going to look into how much physical therapy would cost and give [place] a call back if she wants to do this.  Otherwise advised f/u in 4 weeks but calling me over 1-2 weeks if she's not continuing to improve.

## 2017-07-07 ENCOUNTER — Ambulatory Visit: Payer: BLUE CROSS/BLUE SHIELD | Admitting: Skilled Nursing Facility1

## 2017-11-14 ENCOUNTER — Other Ambulatory Visit: Payer: Self-pay | Admitting: Family Medicine

## 2017-11-14 DIAGNOSIS — F419 Anxiety disorder, unspecified: Secondary | ICD-10-CM

## 2017-11-14 DIAGNOSIS — F411 Generalized anxiety disorder: Secondary | ICD-10-CM

## 2017-11-17 NOTE — Telephone Encounter (Signed)
Ok to fill but need uds

## 2017-11-17 NOTE — Telephone Encounter (Signed)
Pt is requesting refill on alprazolam.   Last OV: 06/16/2017 Last Fill: 06/15/2017 #30 and 0RF UDS: None seen

## 2017-11-19 ENCOUNTER — Other Ambulatory Visit: Payer: Self-pay | Admitting: Family Medicine

## 2017-11-19 DIAGNOSIS — F411 Generalized anxiety disorder: Secondary | ICD-10-CM

## 2017-11-19 MED ORDER — ALPRAZOLAM 0.25 MG PO TABS: ORAL_TABLET | ORAL | 0 refills | Status: DC

## 2017-12-29 ENCOUNTER — Other Ambulatory Visit: Payer: Self-pay | Admitting: Family Medicine

## 2017-12-29 DIAGNOSIS — F419 Anxiety disorder, unspecified: Secondary | ICD-10-CM

## 2018-03-08 ENCOUNTER — Other Ambulatory Visit: Payer: Self-pay | Admitting: Family Medicine

## 2018-03-08 DIAGNOSIS — F419 Anxiety disorder, unspecified: Secondary | ICD-10-CM

## 2018-05-09 ENCOUNTER — Other Ambulatory Visit: Payer: Self-pay | Admitting: Family Medicine

## 2018-05-09 DIAGNOSIS — F419 Anxiety disorder, unspecified: Secondary | ICD-10-CM

## 2018-06-11 ENCOUNTER — Other Ambulatory Visit: Payer: Self-pay | Admitting: Family Medicine

## 2018-06-11 ENCOUNTER — Encounter: Payer: Self-pay | Admitting: Family Medicine

## 2018-06-11 ENCOUNTER — Other Ambulatory Visit: Payer: Self-pay

## 2018-06-11 ENCOUNTER — Telehealth: Payer: Self-pay | Admitting: Family Medicine

## 2018-06-11 ENCOUNTER — Ambulatory Visit (INDEPENDENT_AMBULATORY_CARE_PROVIDER_SITE_OTHER): Payer: BLUE CROSS/BLUE SHIELD | Admitting: Family Medicine

## 2018-06-11 VITALS — Temp 97.6°F | Wt 188.7 lb

## 2018-06-11 DIAGNOSIS — K649 Unspecified hemorrhoids: Secondary | ICD-10-CM

## 2018-06-11 DIAGNOSIS — F419 Anxiety disorder, unspecified: Secondary | ICD-10-CM

## 2018-06-11 MED ORDER — HYDROCORT-PRAMOXINE (PERIANAL) 1-1 % EX FOAM: 1.0000 | Freq: Two times a day (BID) | CUTANEOUS | 3 refills | Status: DC

## 2018-06-11 MED ORDER — HYDROCORTISONE (PERIANAL) 2.5 % EX CREA: 1.0000 "application " | TOPICAL_CREAM | Freq: Two times a day (BID) | CUTANEOUS | 0 refills | Status: DC

## 2018-06-11 MED ORDER — CITALOPRAM HYDROBROMIDE 10 MG PO TABS: 30.0000 mg | ORAL_TABLET | Freq: Every day | ORAL | 1 refills | Status: DC

## 2018-06-11 NOTE — Telephone Encounter (Signed)
Copied from [place] [id number]. Topic: Quick Communication - Rx Refill/Question >> Jun 11, 2018 12:33 PM Margot Ables wrote: Medication: hydrocortisone-pramoxine (PROCTOFOAM HC) rectal foam - medication requires PA and is very expensive. Pt asking for alternative.  Has the patient contacted their pharmacy? yes Preferred Pharmacy (with phone number or street name): [id number] - [address], [place] AT [facility] [address] [phone] (Phone) [phone] (Fax)

## 2018-06-11 NOTE — Telephone Encounter (Signed)
Done

## 2018-06-11 NOTE — Progress Notes (Signed)
Virtual Visit via Video Note  I connected with Caroline Ward on 06/11/18 at  9:00 AM EDT by a video enabled telemedicine application and verified that I am speaking with the correct person using two identifiers.  Location: Patient: home  Provider: office    I discussed the limitations of evaluation and management by telemedicine and the availability of in person appointments. The patient expressed understanding and agreed to proceed.  History of Present Illness: Pt is home doing well with the lexapro.  No complaints.   Pt also c/o ext hemorrhoids.  She started a new job where she is sitting all day.  otc meds not helping.  No bleeding   Observations/Objective: Vitals:   06/11/18 0829  Temp: 97.6 F (36.4 C)  pt in NAD    Assessment and Plan: 1. Hemorrhoids, unspecified hemorrhoid type Sitz baths [employer] fiber in your diet Refer to GI if no better  - hydrocortisone-pramoxine (PROCTOFOAM HC) rectal foam; Place 1 applicator rectally 2 (two) times daily.  Dispense: 10 g; Refill: 3  2. Anxiety Stable  Refill meds  - citalopram (CELEXA) 10 MG tablet; Take 3 tablets (30 mg total) by mouth daily.  Dispense: 270 tablet; Refill: 1   Follow Up Instructions:    I discussed the assessment and treatment plan with the patient. The patient was provided an opportunity to ask questions and all were answered. The patient agreed with the plan and demonstrated an understanding of the instructions.   The patient was advised to call back or seek an in-person evaluation if the symptoms worsen or if the condition fails to improve as anticipated.  I provided 25 minutes of non-face-to-face time during this encounter.   Ann Held, DO

## 2018-06-11 NOTE — Telephone Encounter (Signed)
Please advise 

## 2018-09-15 ENCOUNTER — Other Ambulatory Visit: Payer: Self-pay | Admitting: *Deleted

## 2018-09-15 DIAGNOSIS — K649 Unspecified hemorrhoids: Secondary | ICD-10-CM

## 2018-09-15 MED ORDER — HYDROCORTISONE (PERIANAL) 2.5 % EX CREA: 1.0000 "application " | TOPICAL_CREAM | Freq: Two times a day (BID) | CUTANEOUS | 0 refills | Status: DC

## 2018-10-28 ENCOUNTER — Encounter: Payer: BLUE CROSS/BLUE SHIELD | Admitting: Family Medicine

## 2018-11-26 ENCOUNTER — Telehealth: Payer: Self-pay | Admitting: Family Medicine

## 2018-11-26 NOTE — Telephone Encounter (Signed)
Confirmed with patient.

## 2018-11-26 NOTE — Telephone Encounter (Signed)
Patient is calling to verify her cpe that is scheduled is virtual. Patient states that she checked with her insurance and tele doc is approved through her insurance.   Please advise [phone] New Insurance through [place].

## 2018-12-11 ENCOUNTER — Encounter: Payer: Self-pay | Admitting: Family Medicine

## 2018-12-14 ENCOUNTER — Encounter: Payer: Self-pay | Admitting: Family Medicine

## 2018-12-15 ENCOUNTER — Ambulatory Visit (INDEPENDENT_AMBULATORY_CARE_PROVIDER_SITE_OTHER): Payer: PRIVATE HEALTH INSURANCE | Admitting: Family Medicine

## 2018-12-15 ENCOUNTER — Other Ambulatory Visit: Payer: Self-pay

## 2018-12-15 ENCOUNTER — Encounter: Payer: Self-pay | Admitting: Family Medicine

## 2018-12-15 VITALS — BP 115/75 | HR 55 | Ht 68.0 in | Wt 210.0 lb

## 2018-12-15 DIAGNOSIS — I10 Essential (primary) hypertension: Secondary | ICD-10-CM | POA: Diagnosis not present

## 2018-12-15 DIAGNOSIS — F419 Anxiety disorder, unspecified: Secondary | ICD-10-CM | POA: Diagnosis not present

## 2018-12-15 DIAGNOSIS — Z0000 Encounter for general adult medical examination without abnormal findings: Secondary | ICD-10-CM

## 2018-12-15 DIAGNOSIS — F411 Generalized anxiety disorder: Secondary | ICD-10-CM

## 2018-12-15 DIAGNOSIS — Z9884 Bariatric surgery status: Secondary | ICD-10-CM

## 2018-12-15 MED ORDER — ALPRAZOLAM 0.25 MG PO TABS: ORAL_TABLET | ORAL | 0 refills | Status: DC

## 2018-12-15 MED ORDER — CITALOPRAM HYDROBROMIDE 10 MG PO TABS: 30.0000 mg | ORAL_TABLET | Freq: Every day | ORAL | 3 refills | Status: DC

## 2018-12-15 NOTE — Progress Notes (Signed)
Virtual Visit via Video Note  I connected with Caroline Ward on 12/15/18 at  9:00 AM EST by a video enabled telemedicine application and verified that I am speaking with the correct person using two identifiers.  Location: Patient: home with family members Provider: home   I discussed the limitations of evaluation and management by telemedicine and the availability of in person appointments. The patient expressed understanding and agreed to proceed.  History of Present Illness: Pt is home and we are doing a virtual cpe today due to covid and she has high risk family members at home Pt with no complaints  Past Medical History:  Diagnosis Date  . Anxiety   . Asthma    on inhaler  . Complication of anesthesia   . Depression   . GERD (gastroesophageal reflux disease)   . Heart murmur   . Hemorrhoids    in past  . Migraine   . Pneumonia    3 years ago/has bronchitis often  . UTI (urinary tract infection)    in past   Past Surgical History:  Procedure Laterality Date  . FRACTURE SURGERY     3rd metacarpal  . GASTRIC ROUX-EN-Y N/A 06/30/2016   Procedure: LAPAROSCOPIC ROUX-EN-Y GASTRIC BYPASS WITH UPPER ENDOSCOPY;  Surgeon: Excell Seltzer, MD;  Location: [facility];  Service: General;  Laterality: N/A;  . WISDOM TOOTH EXTRACTION     Social History   Socioeconomic History  . Marital status: Single    Spouse name: Not on file  . Number of children: 0  . Years of education: Not on file  . Highest education level: Not on file  Occupational History  . Occupation: self employed-- Retail banker: [employer]  Social Needs  . Financial resource strain: Not on file  . Food insecurity    Worry: Not on file    Inability: Not on file  . Transportation needs    Medical: Not on file    Non-medical: Not on file  Tobacco Use  . Smoking status: Never Smoker  . Smokeless tobacco: Never Used  Substance and Sexual Activity  . Alcohol use: No    Alcohol/week: 0.0  standard drinks    Comment: Occ  . Drug use: No  . Sexual activity: Not Currently    Partners: Male  Lifestyle  . Physical activity    Days per week: Not on file    Minutes per session: Not on file  . Stress: Not on file  Relationships  . Social Herbalist on phone: Not on file    Gets together: Not on file    Attends religious service: Not on file    Active member of club or organization: Not on file    Attends meetings of clubs or organizations: Not on file    Relationship status: Not on file  . Intimate partner violence    Fear of current or ex partner: Not on file    Emotionally abused: Not on file    Physically abused: Not on file    Forced sexual activity: Not on file  Other Topics Concern  . Not on file  Social History Narrative   Exercise--no   Allergies  Allergen Reactions  . Myrbetriq [Mirabegron] Itching   Current Outpatient Medications on File Prior to Visit  Medication Sig Dispense Refill  . acetaminophen (TYLENOL) 500 MG tablet Take 1,000 mg by mouth daily as needed for mild pain.     Marland Kitchen CALCIUM  PO Take 1 tablet by mouth 3 (three) times daily.    . Cyanocobalamin (B-12 PO) Take 1 tablet by mouth daily.    . hydrocortisone (ANUSOL-HC) 2.5 % rectal cream Place 1 application rectally 2 (two) times daily. 30 g 0  . Multiple Vitamin (MULTIVITAMIN) capsule Take 1 capsule by mouth daily.    . Multiple Vitamins-Minerals (VITAMIN D3 COMPLETE PO) Take by mouth.    . Thiamine HCl (VITAMIN B-1 PO) Take by mouth.    . Turmeric 500 MG CAPS Take 1 capsule by mouth daily.    . Biotin 1000 MCG tablet Take by mouth.     No current facility-administered medications on file prior to visit.       Observations/Objective: Vitals:   12/15/18 0853  BP: 115/75  Pulse: (!) 55   Pt is in nad Pt AAO  Pt denies inc in anxiety or depression  Assessment and Plan: 1. Generalized anxiety disorder Stable con't meds  - ALPRAZolam (XANAX) 0.25 MG tablet; TAKE 1  TABLET(0.25 MG) BY MOUTH THREE TIMES DAILY AS NEEDED FOR ANXIETY  Dispense: 30 tablet; Refill: 0  2. Anxiety stable - citalopram (CELEXA) 10 MG tablet; Take 3 tablets (30 mg total) by mouth daily.  Dispense: 270 tablet; Refill: 3  3. Preventative health care Ghm utd Check labs  See AVS  pt is not going to do pap or mamo this year due to covid  - Lipid panel; Future - CBC with Differential; Future - TSH; Future - Comprehensive metabolic panel; Future - Vitamin B12; Future - Vitamin D (25 hydroxy); Future - Hemoglobin A1c; Future  4. Essential hypertension Well controlled, no changes to meds. Encouraged heart healthy diet such as the Ward diet and exercise as tolerated.  - Lipid panel; Future - CBC with Differential; Future - TSH; Future - Comprehensive metabolic panel; Future  5. History of bariatric surgery Check labs  - TSH; Future - Comprehensive metabolic panel; Future - Vitamin B12; Future - Vitamin D (25 hydroxy); Future - Hemoglobin A1c; Future  Follow Up Instructions:    I discussed the assessment and treatment plan with the patient. The patient was provided an opportunity to ask questions and all were answered. The patient agreed with the plan and demonstrated an understanding of the instructions.   The patient was advised to call back or seek an in-person evaluation if the symptoms worsen or if the condition fails to improve as anticipated.  I provided 25 minutes of non-face-to-face time during this encounter.   Ann Held, DO

## 2018-12-17 ENCOUNTER — Other Ambulatory Visit: Payer: Self-pay

## 2018-12-17 ENCOUNTER — Other Ambulatory Visit (INDEPENDENT_AMBULATORY_CARE_PROVIDER_SITE_OTHER): Payer: PRIVATE HEALTH INSURANCE

## 2018-12-17 DIAGNOSIS — Z0000 Encounter for general adult medical examination without abnormal findings: Secondary | ICD-10-CM | POA: Diagnosis not present

## 2018-12-17 DIAGNOSIS — I10 Essential (primary) hypertension: Secondary | ICD-10-CM

## 2018-12-17 DIAGNOSIS — Z9884 Bariatric surgery status: Secondary | ICD-10-CM | POA: Diagnosis not present

## 2018-12-17 LAB — COMPREHENSIVE METABOLIC PANEL
ALT: 19 U/L (ref 0–35)
AST: 16 U/L (ref 0–37)
Albumin: 4 g/dL (ref 3.5–5.2)
Alkaline Phosphatase: 99 U/L (ref 39–117)
BUN: 21 mg/dL (ref 6–23)
CO2: 28 mEq/L (ref 19–32)
Calcium: 9.1 mg/dL (ref 8.4–10.5)
Chloride: 103 mEq/L (ref 96–112)
Creatinine, Ser: 0.72 mg/dL (ref 0.40–1.20)
GFR: 83.81 mL/min (ref >60.00)
Glucose, Bld: 82 mg/dL (ref 70–99)
Potassium: 4 mEq/L (ref 3.5–5.1)
Sodium: 139 mEq/L (ref 135–145)
Total Bilirubin: 0.3 mg/dL (ref 0.2–1.2)
Total Protein: 6.3 g/dL (ref 6.0–8.3)

## 2018-12-17 LAB — CBC WITH DIFFERENTIAL/PLATELET
Basophils Absolute: 0.1 10*3/uL (ref 0.0–0.1)
Basophils Relative: 1 % (ref 0.0–3.0)
Eosinophils Absolute: 0.2 10*3/uL (ref 0.0–0.7)
Eosinophils Relative: 2.4 % (ref 0.0–5.0)
HCT: 40 % (ref 36.0–46.0)
Hemoglobin: 13.2 g/dL (ref 12.0–15.0)
Lymphocytes Relative: 29.4 % (ref 12.0–46.0)
Lymphs Abs: 2.6 10*3/uL (ref 0.7–4.0)
MCHC: 33.1 g/dL (ref 30.0–36.0)
MCV: 85.5 fl (ref 78.0–100.0)
Monocytes Absolute: 0.6 10*3/uL (ref 0.1–1.0)
Monocytes Relative: 6.2 % (ref 3.0–12.0)
Neutro Abs: 5.4 10*3/uL (ref 1.4–7.7)
Neutrophils Relative %: 61 % (ref 43.0–77.0)
Platelets: 243 10*3/uL (ref 150.0–400.0)
RBC: 4.68 Mil/uL (ref 3.87–5.11)
RDW: 13.2 % (ref 11.5–15.5)
WBC: 8.9 10*3/uL (ref 4.0–10.5)

## 2018-12-17 LAB — HEMOGLOBIN A1C: Hgb A1c MFr Bld: 5.5 % (ref 4.6–6.5)

## 2018-12-17 LAB — LIPID PANEL
Cholesterol: 191 mg/dL (ref 0–200)
HDL: 59.9 mg/dL (ref >39.00)
LDL Cholesterol: 117 mg/dL — ABNORMAL HIGH (ref 0–99)
NonHDL: 130.82
Total CHOL/HDL Ratio: 3
Triglycerides: 70 mg/dL (ref 0.0–149.0)
VLDL: 14 mg/dL (ref 0.0–40.0)

## 2018-12-17 LAB — VITAMIN B12: Vitamin B-12: >1500 pg/mL — ABNORMAL HIGH (ref 211–911)

## 2018-12-17 LAB — VITAMIN D 25 HYDROXY (VIT D DEFICIENCY, FRACTURES): VITD: 35.43 ng/mL (ref 30.00–100.00)

## 2018-12-17 LAB — TSH: TSH: 1.05 u[IU]/mL (ref 0.35–4.50)

## 2018-12-24 ENCOUNTER — Encounter: Payer: Self-pay | Admitting: Family Medicine

## 2019-01-24 ENCOUNTER — Encounter (HOSPITAL_COMMUNITY): Payer: Self-pay

## 2019-04-12 ENCOUNTER — Other Ambulatory Visit (HOSPITAL_BASED_OUTPATIENT_CLINIC_OR_DEPARTMENT_OTHER): Payer: Self-pay | Admitting: Family Medicine

## 2019-04-12 DIAGNOSIS — Z1231 Encounter for screening mammogram for malignant neoplasm of breast: Secondary | ICD-10-CM

## 2019-05-09 ENCOUNTER — Ambulatory Visit (HOSPITAL_BASED_OUTPATIENT_CLINIC_OR_DEPARTMENT_OTHER): Payer: PRIVATE HEALTH INSURANCE

## 2019-05-10 ENCOUNTER — Ambulatory Visit (HOSPITAL_BASED_OUTPATIENT_CLINIC_OR_DEPARTMENT_OTHER)
Admission: RE | Admit: 2019-05-10 | Discharge: 2019-05-10 | Disposition: A | Discharge status: Home / Self Care | Payer: PRIVATE HEALTH INSURANCE | Source: Ambulatory Visit | Attending: Family Medicine | Admitting: Family Medicine

## 2019-05-10 ENCOUNTER — Other Ambulatory Visit: Payer: Self-pay

## 2019-05-10 DIAGNOSIS — Z1231 Encounter for screening mammogram for malignant neoplasm of breast: Secondary | ICD-10-CM | POA: Diagnosis not present

## 2019-08-29 ENCOUNTER — Telehealth: Payer: Self-pay | Admitting: Family Medicine

## 2019-08-29 NOTE — Telephone Encounter (Signed)
Virtual or in person?

## 2019-08-29 NOTE — Telephone Encounter (Signed)
Caller: Tandy  Call Back # [phone]  Patient states abdominal  pain since this morning and diarrhea. Patient has had a headache since last Friday.   Please Advise

## 2019-08-29 NOTE — Telephone Encounter (Signed)
Virtual--- she should get covid test too

## 2019-09-01 ENCOUNTER — Encounter: Payer: PRIVATE HEALTH INSURANCE | Admitting: Family Medicine

## 2019-09-01 ENCOUNTER — Other Ambulatory Visit: Payer: Self-pay

## 2019-09-14 NOTE — Progress Notes (Signed)
This encounter was created in error - please disregard.

## 2019-11-02 ENCOUNTER — Telehealth: Payer: Self-pay | Admitting: Family Medicine

## 2019-11-02 ENCOUNTER — Other Ambulatory Visit: Payer: Self-pay | Admitting: Family Medicine

## 2019-11-02 DIAGNOSIS — Z0000 Encounter for general adult medical examination without abnormal findings: Secondary | ICD-10-CM

## 2019-11-02 NOTE — Telephone Encounter (Signed)
Spoke with patient. Lab appt scheduled.

## 2019-11-02 NOTE — Telephone Encounter (Signed)
Order placed

## 2019-11-02 NOTE — Telephone Encounter (Signed)
Patient is requesting lab order placed prior to her appointment for CPE on 01/05/2020.

## 2019-12-27 ENCOUNTER — Other Ambulatory Visit (INDEPENDENT_AMBULATORY_CARE_PROVIDER_SITE_OTHER): Payer: PRIVATE HEALTH INSURANCE

## 2019-12-27 ENCOUNTER — Other Ambulatory Visit: Payer: Self-pay

## 2019-12-27 DIAGNOSIS — Z0000 Encounter for general adult medical examination without abnormal findings: Secondary | ICD-10-CM | POA: Diagnosis not present

## 2019-12-27 LAB — LIPID PANEL
Cholesterol: 184 mg/dL (ref 0–200)
HDL: 55.5 mg/dL (ref >39.00)
LDL Cholesterol: 114 mg/dL — ABNORMAL HIGH (ref 0–99)
NonHDL: 128.53
Total CHOL/HDL Ratio: 3
Triglycerides: 72 mg/dL (ref 0.0–149.0)
VLDL: 14.4 mg/dL (ref 0.0–40.0)

## 2019-12-27 LAB — COMPREHENSIVE METABOLIC PANEL
ALT: 16 U/L (ref 0–35)
AST: 17 U/L (ref 0–37)
Albumin: 3.8 g/dL (ref 3.5–5.2)
Alkaline Phosphatase: 111 U/L (ref 39–117)
BUN: 18 mg/dL (ref 6–23)
CO2: 27 mEq/L (ref 19–32)
Calcium: 8.9 mg/dL (ref 8.4–10.5)
Chloride: 105 mEq/L (ref 96–112)
Creatinine, Ser: 0.75 mg/dL (ref 0.40–1.20)
GFR: 88.66 mL/min (ref >60.00)
Glucose, Bld: 85 mg/dL (ref 70–99)
Potassium: 4.2 mEq/L (ref 3.5–5.1)
Sodium: 138 mEq/L (ref 135–145)
Total Bilirubin: 0.4 mg/dL (ref 0.2–1.2)
Total Protein: 6.5 g/dL (ref 6.0–8.3)

## 2019-12-27 LAB — CBC WITH DIFFERENTIAL/PLATELET
Basophils Absolute: 0.1 10*3/uL (ref 0.0–0.1)
Basophils Relative: 0.8 % (ref 0.0–3.0)
Eosinophils Absolute: 0.2 10*3/uL (ref 0.0–0.7)
Eosinophils Relative: 2.7 % (ref 0.0–5.0)
HCT: 40.4 % (ref 36.0–46.0)
Hemoglobin: 13.2 g/dL (ref 12.0–15.0)
Lymphocytes Relative: 29.2 % (ref 12.0–46.0)
Lymphs Abs: 2.2 10*3/uL (ref 0.7–4.0)
MCHC: 32.7 g/dL (ref 30.0–36.0)
MCV: 82.7 fl (ref 78.0–100.0)
Monocytes Absolute: 0.5 10*3/uL (ref 0.1–1.0)
Monocytes Relative: 6.3 % (ref 3.0–12.0)
Neutro Abs: 4.6 10*3/uL (ref 1.4–7.7)
Neutrophils Relative %: 61 % (ref 43.0–77.0)
Platelets: 279 10*3/uL (ref 150.0–400.0)
RBC: 4.89 Mil/uL (ref 3.87–5.11)
RDW: 13.1 % (ref 11.5–15.5)
WBC: 7.6 10*3/uL (ref 4.0–10.5)

## 2019-12-27 LAB — TSH: TSH: 1.82 u[IU]/mL (ref 0.35–4.50)

## 2020-01-05 ENCOUNTER — Other Ambulatory Visit: Payer: Self-pay

## 2020-01-05 ENCOUNTER — Ambulatory Visit (INDEPENDENT_AMBULATORY_CARE_PROVIDER_SITE_OTHER): Payer: PRIVATE HEALTH INSURANCE | Admitting: Family Medicine

## 2020-01-05 ENCOUNTER — Encounter: Payer: Self-pay | Admitting: Family Medicine

## 2020-01-05 ENCOUNTER — Other Ambulatory Visit (HOSPITAL_COMMUNITY)
Admission: RE | Admit: 2020-01-05 | Discharge: 2020-01-05 | Disposition: A | Discharge status: Home / Self Care | Payer: 59 | Source: Ambulatory Visit | Attending: Family Medicine | Admitting: Family Medicine

## 2020-01-05 VITALS — BP 122/80 | HR 64 | Temp 98.0°F | Resp 18 | Ht 68.0 in | Wt 244.0 lb

## 2020-01-05 DIAGNOSIS — K649 Unspecified hemorrhoids: Secondary | ICD-10-CM

## 2020-01-05 DIAGNOSIS — F411 Generalized anxiety disorder: Secondary | ICD-10-CM

## 2020-01-05 DIAGNOSIS — Z0000 Encounter for general adult medical examination without abnormal findings: Secondary | ICD-10-CM | POA: Diagnosis not present

## 2020-01-05 DIAGNOSIS — F419 Anxiety disorder, unspecified: Secondary | ICD-10-CM

## 2020-01-05 MED ORDER — CITALOPRAM HYDROBROMIDE 10 MG PO TABS: 30.0000 mg | ORAL_TABLET | Freq: Every day | ORAL | 3 refills | Status: DC

## 2020-01-05 MED ORDER — ALPRAZOLAM 0.25 MG PO TABS: ORAL_TABLET | ORAL | 0 refills | Status: DC

## 2020-01-05 MED ORDER — HYDROCORTISONE (PERIANAL) 2.5 % EX CREA: 1.0000 "application " | TOPICAL_CREAM | Freq: Two times a day (BID) | CUTANEOUS | 2 refills | Status: AC

## 2020-01-05 NOTE — Progress Notes (Signed)
Subjective:     Caroline Ward is a 59 y.o. female and is here for a comprehensive physical exam. The patient reports no problems.  Social History   Socioeconomic History  . Marital status: Single    Spouse name: Not on file  . Number of children: 0  . Years of education: Not on file  . Highest education level: Not on file  Occupational History  . Occupation: self employed-- Retail banker: [employer]  Tobacco Use  . Smoking status: Never Smoker  . Smokeless tobacco: Never Used  Substance and Sexual Activity  . Alcohol use: No    Alcohol/week: 0.0 standard drinks    Comment: Occ  . Drug use: No  . Sexual activity: Not Currently    Partners: Male  Other Topics Concern  . Not on file  Social History Narrative   Exercise--no   Social Determinants of Health   Financial Resource Strain: Not on file  Food Insecurity: Not on file  Transportation Needs: Not on file  Physical Activity: Not on file  Stress: Not on file  Social Connections: Not on file  Intimate Partner Violence: Not on file   Health Maintenance  Topic Date Due  . HIV Screening  Never done  . PAP SMEAR-Modifier  07/20/2017  . MAMMOGRAM  05/09/2020  . TETANUS/TDAP  07/20/2024  . COLONOSCOPY  10/02/2024  . INFLUENZA VACCINE  Completed  . COVID-19 Vaccine  Completed  . Hepatitis C Screening  Completed    The following portions of the patient's history were reviewed and updated as appropriate:  She  has a past medical history of Anxiety, Asthma, Complication of anesthesia, Depression, GERD (gastroesophageal reflux disease), Heart murmur, Hemorrhoids, Migraine, Pneumonia, and UTI (urinary tract infection). She does not have any pertinent problems on file. She  has a past surgical history that includes Wisdom tooth extraction; Fracture surgery; and Gastric Roux-En-Y (N/A, 06/30/2016). Her family history includes Alcohol abuse in her father; Asthma in an other family member; Breast cancer in her  maternal aunt; COPD in her mother; Cancer in her paternal uncle; Colon polyps in her mother and sister; Dementia in her maternal aunt; Diabetes in an other family member; Hyperlipidemia in her mother; Hypertension in her mother; Liver disease in her maternal aunt; Lung cancer in her father; Other in her brother and sister; Pancreatic cancer in her brother; Throat cancer in her father. She  reports that she has never smoked. She has never used smokeless tobacco. She reports that she does not drink alcohol and does not use drugs. She has a current medication list which includes the following prescription(s): acetaminophen, biotin, calcium, cyanocobalamin, multivitamin, multiple vitamins-minerals, thiamine hcl, turmeric, alprazolam, citalopram, and hydrocortisone. Current Outpatient Medications on File Prior to Visit  Medication Sig Dispense Refill  . acetaminophen (TYLENOL) 500 MG tablet Take 1,000 mg by mouth daily as needed for mild pain.     . Biotin 1000 MCG tablet Take by mouth.    Marland Kitchen CALCIUM PO Take 1 tablet by mouth 3 (three) times daily.    . Cyanocobalamin (B-12 PO) Take 1 tablet by mouth daily.    . Multiple Vitamin (MULTIVITAMIN) capsule Take 1 capsule by mouth daily.    . Multiple Vitamins-Minerals (VITAMIN D3 COMPLETE PO) Take by mouth.    . Thiamine HCl (VITAMIN B-1 PO) Take by mouth.    . Turmeric 500 MG CAPS Take 1 capsule by mouth daily.     No current facility-administered medications on  file prior to visit.   She is allergic to myrbetriq [mirabegron]..  Review of Systems Review of Systems  Constitutional: Negative for activity change, appetite change and fatigue.  HENT: Negative for hearing loss, congestion, tinnitus and ear discharge.  dentist q40m Eyes: Negative for visual disturbance (see optho q1y -- vision corrected to 20/20 with glasses).  Respiratory: Negative for cough, chest tightness and shortness of breath.   Cardiovascular: Negative for chest pain, palpitations and  leg swelling.  Gastrointestinal: Negative for abdominal pain, diarrhea, constipation and abdominal distention.  Genitourinary: Negative for urgency, frequency, decreased urine volume and difficulty urinating.  Musculoskeletal: Negative for back pain, arthralgias and gait problem.  Skin: Negative for color change, pallor and rash.  Neurological: Negative for dizziness, light-headedness, numbness and headaches.  Hematological: Negative for adenopathy. Does not bruise/bleed easily.  Psychiatric/Behavioral: Negative for suicidal ideas, confusion, sleep disturbance, self-injury, dysphoric mood, decreased concentration and agitation.       Objective:    BP 122/80 (BP Location: Left Arm, Patient Position: Sitting, Cuff Size: Large)   Pulse 64   Temp 98 F (36.7 C) (Oral)   Resp 18   Ht 5\' 8"  (1.727 m)   Wt 244 lb (110.7 kg)   LMP 04/29/2014   SpO2 97%   BMI 37.10 kg/m  General appearance: alert, cooperative, appears stated age and no distress Head: Normocephalic, without obvious abnormality, atraumatic Eyes: negative findings: lids and lashes normal, conjunctivae and sclerae normal and pupils equal, round, reactive to light and accomodation Ears: normal TM's and external ear canals both ears Neck: no adenopathy, no carotid bruit, no JVD, supple, symmetrical, trachea midline and thyroid not enlarged, symmetric, no tenderness/mass/nodules Back: symmetric, no curvature. ROM normal. No CVA tenderness. Lungs: clear to auscultation bilaterally Breasts: gyn Heart: regular rate and rhythm, S1, S2 normal, no murmur, click, rub or gallop Abdomen: soft, non-tender; bowel sounds normal; no masses,  no organomegaly Pelvic: deferred --gyn Extremities: extremities normal, atraumatic, no cyanosis or edema Pulses: 2+ and symmetric Skin: Skin color, texture, turgor normal. No rashes or lesions Lymph nodes: Cervical, supraclavicular, and axillary nodes normal. Neurologic: Alert and oriented X 3,  normal strength and tone. Normal symmetric reflexes. Normal coordination and gait    Assessment:    Healthy female exam.      Plan:    ghm utd Check labs  See After Visit Summary for Counseling Recommendations    1. Anxiety Stable Refill meds  - citalopram (CELEXA) 10 MG tablet; Take 3 tablets (30 mg total) by mouth daily.  Dispense: 270 tablet; Refill: 3  2. Generalized anxiety disorder Stable  - ALPRAZolam (XANAX) 0.25 MG tablet; TAKE 1 TABLET(0.25 MG) BY MOUTH THREE TIMES DAILY AS NEEDED FOR ANXIETY  Dispense: 30 tablet; Refill: 0  3. Hemorrhoids, unspecified hemorrhoid type Pt needs refill  No problems - hydrocortisone (ANUSOL-HC) 2.5 % rectal cream; Place 1 application rectally 2 (two) times daily.  Dispense: 30 g; Refill: 2  4. Preventative health care See above  - Cytology - PAP( [place])

## 2020-01-05 NOTE — Patient Instructions (Signed)

## 2020-01-09 LAB — CYTOLOGY - PAP: Diagnosis: NEGATIVE

## 2020-01-18 ENCOUNTER — Encounter (HOSPITAL_COMMUNITY): Payer: Self-pay

## 2021-01-08 ENCOUNTER — Ambulatory Visit (INDEPENDENT_AMBULATORY_CARE_PROVIDER_SITE_OTHER): Payer: PRIVATE HEALTH INSURANCE | Admitting: Medical

## 2021-01-08 VITALS — BP 130/60 | HR 57 | Resp 18 | Ht 68.0 in | Wt 243.0 lb

## 2021-01-08 DIAGNOSIS — Z1231 Encounter for screening mammogram for malignant neoplasm of breast: Secondary | ICD-10-CM | POA: Diagnosis not present

## 2021-01-08 DIAGNOSIS — F411 Generalized anxiety disorder: Secondary | ICD-10-CM

## 2021-01-08 DIAGNOSIS — Z0001 Encounter for general adult medical examination with abnormal findings: Secondary | ICD-10-CM | POA: Diagnosis not present

## 2021-01-08 DIAGNOSIS — F419 Anxiety disorder, unspecified: Secondary | ICD-10-CM | POA: Diagnosis not present

## 2021-01-08 DIAGNOSIS — N951 Menopausal and female climacteric states: Secondary | ICD-10-CM | POA: Diagnosis not present

## 2021-01-08 DIAGNOSIS — Z0000 Encounter for general adult medical examination without abnormal findings: Secondary | ICD-10-CM | POA: Diagnosis not present

## 2021-01-08 LAB — CBC WITH DIFFERENTIAL/PLATELET
Basophils Absolute: 0.1 10*3/uL (ref 0.0–0.1)
Basophils Relative: 0.9 % (ref 0.0–3.0)
Eosinophils Absolute: 0.2 10*3/uL (ref 0.0–0.7)
Eosinophils Relative: 2.1 % (ref 0.0–5.0)
HCT: 39.4 % (ref 36.0–46.0)
Hemoglobin: 12.7 g/dL (ref 12.0–15.0)
Lymphocytes Relative: 26.9 % (ref 12.0–46.0)
Lymphs Abs: 2 10*3/uL (ref 0.7–4.0)
MCHC: 32.1 g/dL (ref 30.0–36.0)
MCV: 81.9 fl (ref 78.0–100.0)
Monocytes Absolute: 0.6 10*3/uL (ref 0.1–1.0)
Monocytes Relative: 8 % (ref 3.0–12.0)
Neutro Abs: 4.6 10*3/uL (ref 1.4–7.7)
Neutrophils Relative %: 62.1 % (ref 43.0–77.0)
Platelets: 260 10*3/uL (ref 150.0–400.0)
RBC: 4.81 Mil/uL (ref 3.87–5.11)
RDW: 16.6 % — ABNORMAL HIGH (ref 11.5–15.5)
WBC: 7.4 10*3/uL (ref 4.0–10.5)

## 2021-01-08 LAB — LIPID PANEL
Cholesterol: 184 mg/dL (ref 0–200)
HDL: 51.1 mg/dL (ref >39.00)
LDL Cholesterol: 113 mg/dL — ABNORMAL HIGH (ref 0–99)
NonHDL: 132.57
Total CHOL/HDL Ratio: 4
Triglycerides: 96 mg/dL (ref 0.0–149.0)
VLDL: 19.2 mg/dL (ref 0.0–40.0)

## 2021-01-08 LAB — COMPREHENSIVE METABOLIC PANEL
ALT: 15 U/L (ref 0–35)
AST: 18 U/L (ref 0–37)
Albumin: 3.8 g/dL (ref 3.5–5.2)
Alkaline Phosphatase: 93 U/L (ref 39–117)
BUN: 13 mg/dL (ref 6–23)
CO2: 28 mEq/L (ref 19–32)
Calcium: 9 mg/dL (ref 8.4–10.5)
Chloride: 103 mEq/L (ref 96–112)
Creatinine, Ser: 0.71 mg/dL (ref 0.40–1.20)
GFR: 94 mL/min (ref >60.00)
Glucose, Bld: 80 mg/dL (ref 70–99)
Potassium: 4.1 mEq/L (ref 3.5–5.1)
Sodium: 140 mEq/L (ref 135–145)
Total Bilirubin: 0.4 mg/dL (ref 0.2–1.2)
Total Protein: 6.3 g/dL (ref 6.0–8.3)

## 2021-01-08 MED ORDER — ALPRAZOLAM 0.5 MG PO TABS: ORAL_TABLET | ORAL | 0 refills | Status: AC

## 2021-01-08 MED ORDER — CITALOPRAM HYDROBROMIDE 10 MG PO TABS: 30.0000 mg | ORAL_TABLET | Freq: Every day | ORAL | 3 refills | Status: DC

## 2021-01-08 NOTE — Patient Instructions (Addendum)
For you wellness exam today I have ordered cbc, cmp and lipid panel.  Pt will get covid booster.  Shingrix call and discuss if covered by insurance.  Recommend exercise and healthy diet.  We will let you know lab results as they come in.  Follow up date appointment will be determined after lab review.    For anxiety refilled celexa for year and 10 tab rx of xanax. Mood stable.  For vaginal dryness recommend sending my chart message to your pcp to see what her recommendation may be(low dose estrogen cream of just vagisil)  Preventive Care 29-33 Years Old, Female Preventive care refers to lifestyle choices and visits with your health care provider that can promote health and wellness. Preventive care visits are also called wellness exams. What can I expect for my preventive care visit? Counseling Your health care provider may ask you questions about your: Medical history, including: Past medical problems. Family medical history. Pregnancy history. Current health, including: Menstrual cycle. Method of birth control. Emotional well-being. Home life and relationship well-being. Sexual activity and sexual health. Lifestyle, including: Alcohol, nicotine or tobacco, and drug use. Access to firearms. Diet, exercise, and sleep habits. Work and work Statistician. Sunscreen use. Safety issues such as seatbelt and bike helmet use. Physical exam Your health care provider will check your: Height and weight. These may be used to calculate your BMI (body mass index). BMI is a measurement that tells if you are at a healthy weight. Waist circumference. This measures the distance around your waistline. This measurement also tells if you are at a healthy weight and may help predict your risk of certain diseases, such as type 2 diabetes and high blood pressure. Heart rate and blood pressure. Body temperature. Skin for abnormal spots. What immunizations do I need? Vaccines are usually given at  various ages, according to a schedule. Your health care provider will recommend vaccines for you based on your age, medical history, and lifestyle or other factors, such as travel or where you work. What tests do I need? Screening Your health care provider may recommend screening tests for certain conditions. This may include: Lipid and cholesterol levels. Diabetes screening. This is done by checking your blood sugar (glucose) after you have not eaten for a while (fasting). Pelvic exam and Pap test. Hepatitis B test. Hepatitis C test. HIV (human immunodeficiency virus) test. STI (sexually transmitted infection) testing, if you are at risk. Lung cancer screening. Colorectal cancer screening. Mammogram. Talk with your health care provider about when you should start having regular mammograms. This may depend on whether you have a family history of breast cancer. BRCA-related cancer screening. This may be done if you have a family history of breast, ovarian, tubal, or peritoneal cancers. Bone density scan. This is done to screen for osteoporosis. Talk with your health care provider about your test results, treatment options, and if necessary, the need for more tests. Follow these instructions at home: Eating and drinking  Eat a diet that includes fresh fruits and vegetables, whole grains, lean protein, and low-fat dairy products. Take vitamin and mineral supplements as recommended by your health care provider. Do not drink alcohol if: Your health care provider tells you not to drink. You are pregnant, may be pregnant, or are planning to become pregnant. If you drink alcohol: Limit how much you have to 0-1 drink a day. Know how much alcohol is in your drink. In the [place] one drink equals one 12 oz bottle of beer (355 mL),  one 5 oz glass of wine (148 mL), or one 1 oz glass of hard liquor (44 mL). Lifestyle Brush your teeth every morning and night with fluoride toothpaste. Floss one time each  day. Exercise for at least 30 minutes 5 or more days each week. Do not use any products that contain nicotine or tobacco. These products include cigarettes, chewing tobacco, and vaping devices, such as e-cigarettes. If you need help quitting, ask your health care provider. Do not use drugs. If you are sexually active, practice safe sex. Use a condom or other form of protection to prevent STIs. If you do not wish to become pregnant, use a form of birth control. If you plan to become pregnant, see your health care provider for a prepregnancy visit. Take aspirin only as told by your health care provider. Make sure that you understand how much to take and what form to take. Work with your health care provider to find out whether it is safe and beneficial for you to take aspirin daily. Find healthy ways to manage stress, such as: Meditation, yoga, or listening to music. Journaling. Talking to a trusted person. Spending time with friends and family. Minimize exposure to UV radiation to reduce your risk of skin cancer. Safety Always wear your seat belt while driving or riding in a vehicle. Do not drive: If you have been drinking alcohol. Do not ride with someone who has been drinking. When you are tired or distracted. While texting. If you have been using any mind-altering substances or drugs. Wear a helmet and other protective equipment during sports activities. If you have firearms in your house, make sure you follow all gun safety procedures. Seek help if you have been physically or sexually abused. What's next? Visit your health care provider once a year for an annual wellness visit. Ask your health care provider how often you should have your eyes and teeth checked. Stay up to date on all vaccines. This information is not intended to replace advice given to you by your health care provider. Make sure you discuss any questions you have with your health care provider. Document Revised:  06/27/2020 Document Reviewed: 06/27/2020 Elsevier Patient Education  [place].

## 2021-01-08 NOTE — Addendum Note (Signed)
Addended by: Anabel Halon on: 01/08/2021 11:02 AM   Modules accepted: Orders

## 2021-01-08 NOTE — Progress Notes (Signed)
Subjective:    Patient ID: Ulice Dash, female    DOB: 11-03-1960, 60 y.o.   MRN: [id number]  HPI  Pt in for wellness exam. Pt is fasting/no breakfast.   Pt works Therapist, art. No regular exercise. Admits not eating healthy. Non smoker. Non smoker. Rare alcohol use about once a month at most.  Pt overdue on yearly mammogram.   Pt got flu vaccine in November  Pt will ask her insurance if shingrix covered.   Pt needs refill of citalopram and xanax. Pt takes citalopram for anxiety. She feels like citalopram controlled anxiety. She very rarely uses xanax. Xanax use about one tab a month. If she knows will be in crowds is when most commonly has to use xanax.  On review of controlled med web site 10 tabs has lasted one year. Not on contract per my review.  Vaginal dryness at times. Age related?    Review of Systems  Constitutional:  Negative for chills, fatigue and fever.  Respiratory:  Negative for apnea, cough, choking and wheezing.   Cardiovascular:  Negative for chest pain and palpitations.  Gastrointestinal:  Negative for abdominal pain.  Genitourinary:  Negative for difficulty urinating, dysuria, pelvic pain and urgency.  Musculoskeletal:  Negative for back pain, gait problem and myalgias.  Neurological:  Negative for dizziness, seizures, weakness, numbness and headaches.  Hematological:  Negative for adenopathy. Does not bruise/bleed easily.  Psychiatric/Behavioral:  Negative for agitation, behavioral problems, confusion and suicidal ideas. The patient is nervous/anxious.     Past Medical History:  Diagnosis Date   Anxiety    Asthma    on inhaler   Complication of anesthesia    Depression    GERD (gastroesophageal reflux disease)    Heart murmur    Hemorrhoids    in past   Migraine    Pneumonia    3 years ago/has bronchitis often   UTI (urinary tract infection)    in past     Social History   Socioeconomic History   Marital status: Single    Spouse  name: Not on file   Number of children: 0   Years of education: Not on file   Highest education level: Not on file  Occupational History   Occupation: self employed-- Retail banker: [employer]  Tobacco Use   Smoking status: Never   Smokeless tobacco: Never  Substance and Sexual Activity   Alcohol use: No    Alcohol/week: 0.0 standard drinks    Comment: Occ   Drug use: No   Sexual activity: Not Currently    Partners: Male  Other Topics Concern   Not on file  Social History Narrative   Exercise--no   Social Determinants of Health   Financial Resource Strain: Not on file  Food Insecurity: Not on file  Transportation Needs: Not on file  Physical Activity: Not on file  Stress: Not on file  Social Connections: Not on file  Intimate Partner Violence: Not on file    Past Surgical History:  Procedure Laterality Date   FRACTURE SURGERY     3rd metacarpal   GASTRIC ROUX-EN-Y N/A 06/30/2016   Procedure: LAPAROSCOPIC ROUX-EN-Y GASTRIC BYPASS WITH UPPER ENDOSCOPY;  Surgeon: Excell Seltzer, MD;  Location: [facility];  Service: General;  Laterality: N/A;   WISDOM TOOTH EXTRACTION      Family History  Problem Relation Age of Onset   Lung cancer Father    Alcohol abuse Father    Throat  cancer Father    Hyperlipidemia Mother    COPD Mother    Hypertension Mother    Colon polyps Mother    Pancreatic cancer Brother        pancreas   Other Sister    Colon polyps Sister    Liver disease Maternal Aunt    Breast cancer Maternal Aunt    Dementia Maternal Aunt    Cancer Paternal Uncle        type unbknown   Other Brother        vein issues   Asthma Other    Diabetes Other     Allergies  Allergen Reactions   Myrbetriq [Mirabegron] Itching    Current Outpatient Medications on File Prior to Visit  Medication Sig Dispense Refill   acetaminophen (TYLENOL) 500 MG tablet Take 1,000 mg by mouth daily as needed for mild pain.      ALPRAZolam (XANAX) 0.25 MG  tablet TAKE 1 TABLET(0.25 MG) BY MOUTH THREE TIMES DAILY AS NEEDED FOR ANXIETY 30 tablet 0   Biotin 1000 MCG tablet Take by mouth.     CALCIUM PO Take 1 tablet by mouth 3 (three) times daily.     citalopram (CELEXA) 10 MG tablet Take 3 tablets (30 mg total) by mouth daily. 270 tablet 3   Cyanocobalamin (B-12 PO) Take 1 tablet by mouth daily.     hydrocortisone (ANUSOL-HC) 2.5 % rectal cream Place 1 application rectally 2 (two) times daily. 30 g 2   Multiple Vitamin (MULTIVITAMIN) capsule Take 1 capsule by mouth daily.     Multiple Vitamins-Minerals (VITAMIN D3 COMPLETE PO) Take by mouth.     Thiamine HCl (VITAMIN B-1 PO) Take by mouth.     Turmeric 500 MG CAPS Take 1 capsule by mouth daily.     No current facility-administered medications on file prior to visit.    BP 130/60    Pulse (!) 57    Resp 18    Ht 5\' 8"  (1.727 m)    Wt 243 lb (110.2 kg)    LMP 04/29/2014    SpO2 96%    BMI 36.95 kg/m       Objective:   Physical Exam  General Mental Status- Alert. General Appearance- Not in acute distress.   Skin General: Color- Normal Color. Moisture- Normal Moisture.  Neck Carotid Arteries- Normal color. Moisture- Normal Moisture. No carotid bruits. No JVD.  Chest and Lung Exam Auscultation: Breath Sounds:-Normal.  Cardiovascular Auscultation:Rythm- Regular. Murmurs & Other Heart Sounds:Auscultation of the heart reveals- No Murmurs.  Abdomen Inspection:-Inspeection Normal. Palpation/Percussion:Note:No mass. Palpation and Percussion of the abdomen reveal- Non Tender, Non Distended + BS, no rebound or guarding.   Neurologic Cranial Nerve exam:- CN III-XII intact(No nystagmus), symmetric smile. Strength:- 5/5 equal and symmetric strength both upper and lower extremities.       Assessment & Plan:   Patient Instructions  For you wellness exam today I have ordered cbc, cmp and lipid panel.  Pt will get covid booster.  Shingrix call and discuss if covered by  insurance.  Recommend exercise and healthy diet.  We will let you know lab results as they come in.  Follow up date appointment will be determined after lab review.    For anxiety refilled celexa for year and 10 tab rx of xanax. Mood stable.  For vaginal dryness recommend sending my chart message to your pcp to see what her recommendation may be.      In addition to wellness exam  did [id number] charge as did address anxiety. Refilling celexa for year and rx of benzodiazepene xanax  Mackie Pai, PA-C

## 2021-01-11 ENCOUNTER — Telehealth (HOSPITAL_BASED_OUTPATIENT_CLINIC_OR_DEPARTMENT_OTHER): Payer: Self-pay

## 2021-01-25 ENCOUNTER — Encounter (HOSPITAL_COMMUNITY): Payer: Self-pay | Admitting: *Deleted

## 2021-10-15 IMAGING — MG DIGITAL SCREENING BILAT W/ TOMO W/ CAD
8 series · 8 of 24 positions shown · non-contrast
Comparison: Previous exam(s).

CLINICAL DATA: Screening.

EXAM:
DIGITAL SCREENING BILATERAL MAMMOGRAM WITH TOMO AND CAD

[L MLO synth-2D]
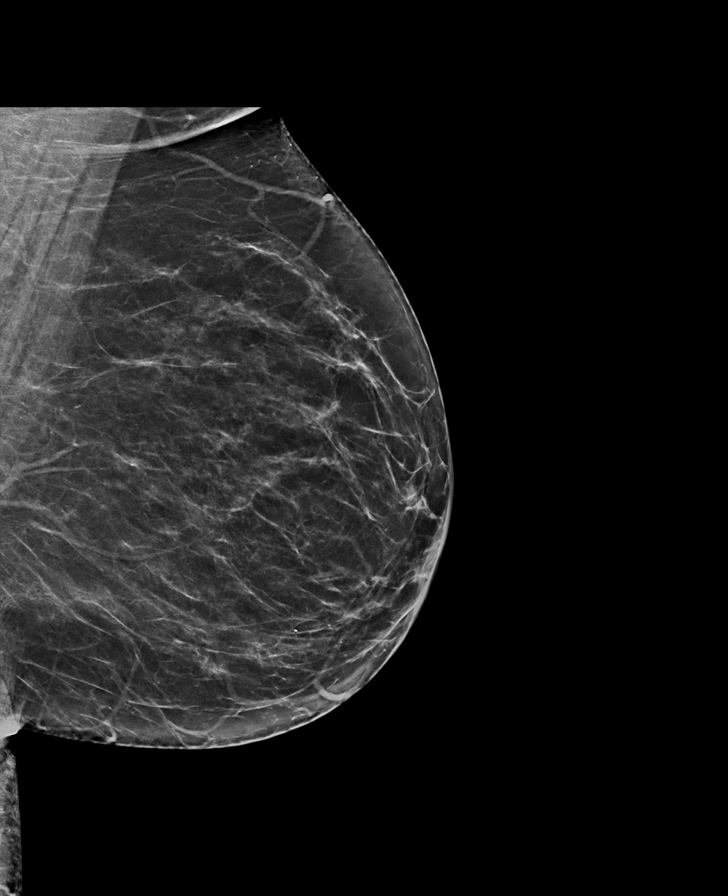

[R MLO synth-2D]
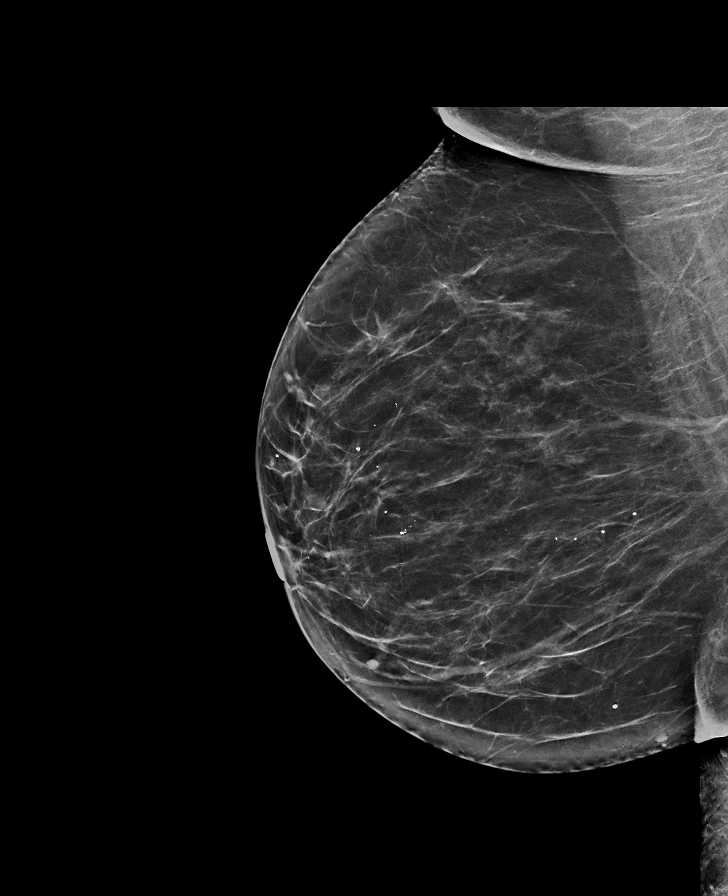

[L CC synth-2D]
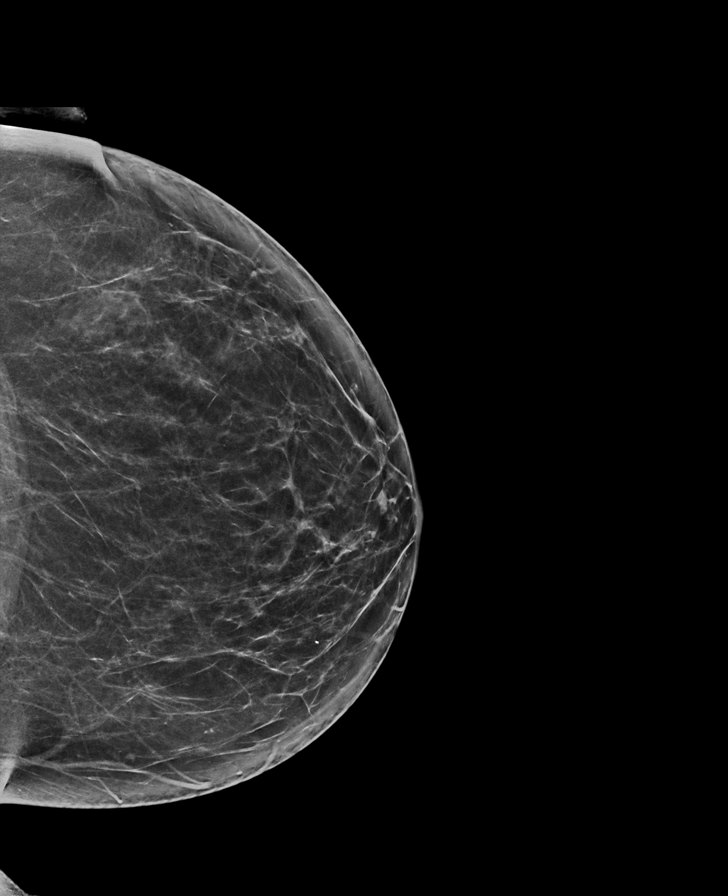

[R CC synth-2D]
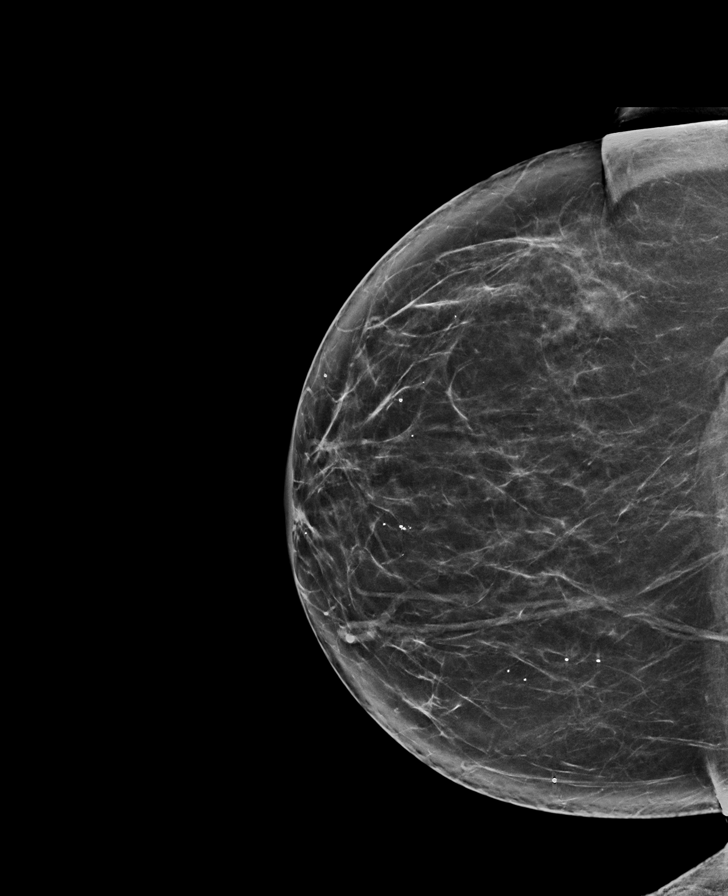

[R CC tomo · tomo slice 37/74.0]
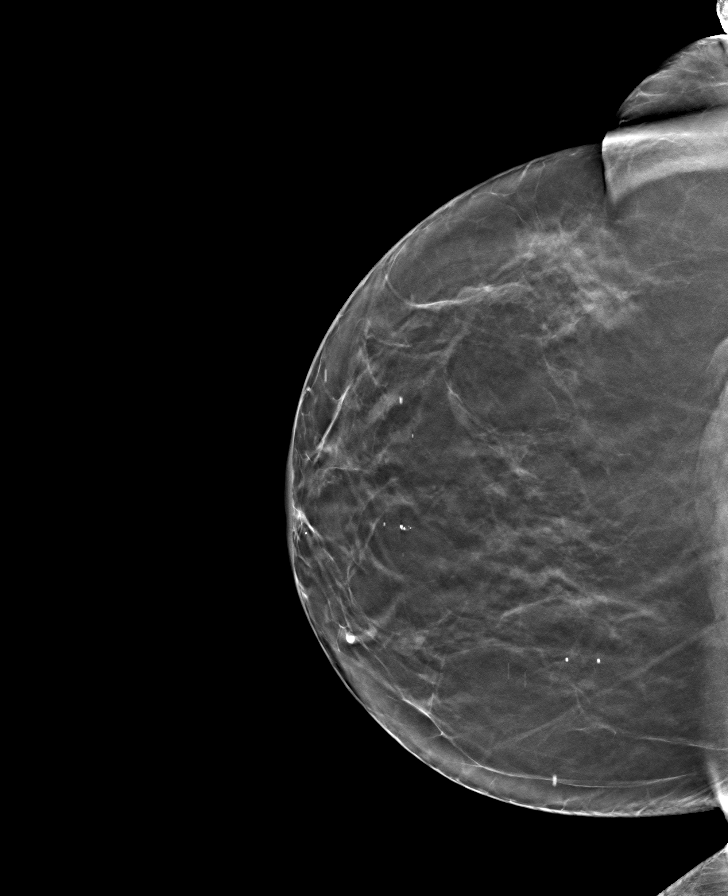

[L CC tomo · tomo slice 36/71.0]
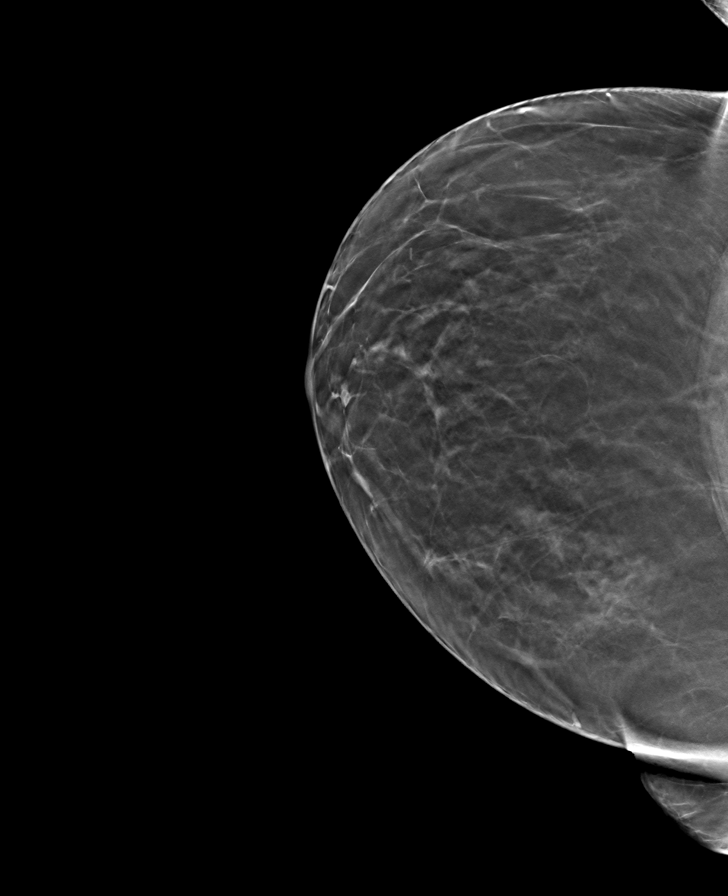

[L MLO tomo · tomo slice 36/71.0]
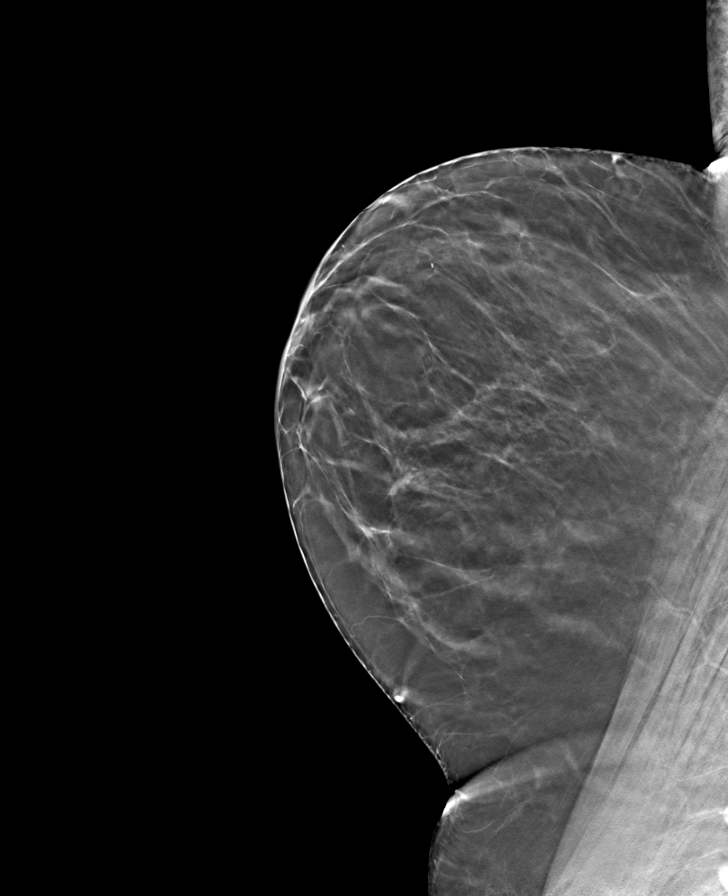

[R MLO tomo · tomo slice 39/77.0]
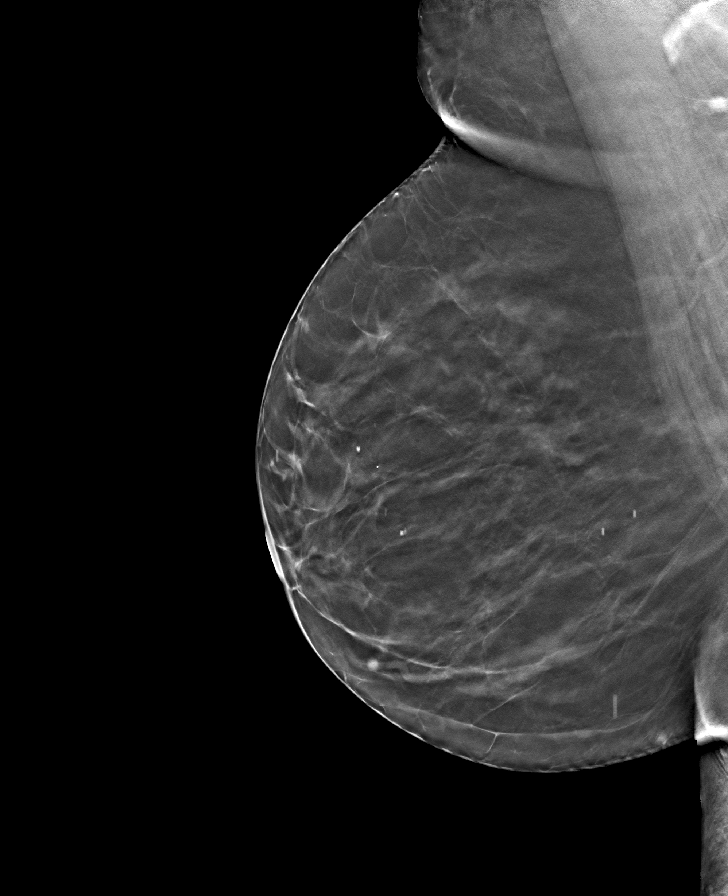

[8 of 24 positions shown; findings below may reference images not displayed]

ACR Breast Density Category b: There are scattered areas of
fibroglandular density.
FINDINGS: There are no findings suspicious for malignancy. Images were
processed with CAD.
IMPRESSION: No mammographic evidence of malignancy. A result letter of this
screening mammogram will be mailed directly to the patient.

RECOMMENDATION:
Screening mammogram in one year. (Code:CN-U-775)

BI-RADS CATEGORY  1: Negative.

## 2021-12-27 ENCOUNTER — Ambulatory Visit (INDEPENDENT_AMBULATORY_CARE_PROVIDER_SITE_OTHER): Payer: Managed Care, Other (non HMO) | Admitting: Medical

## 2021-12-27 ENCOUNTER — Encounter: Payer: Self-pay | Admitting: Medical

## 2021-12-27 VITALS — BP 136/71 | HR 57 | Temp 97.8°F | Resp 18 | Ht 68.0 in | Wt 246.8 lb

## 2021-12-27 DIAGNOSIS — R739 Hyperglycemia, unspecified: Secondary | ICD-10-CM

## 2021-12-27 DIAGNOSIS — Z124 Encounter for screening for malignant neoplasm of cervix: Secondary | ICD-10-CM

## 2021-12-27 DIAGNOSIS — R5383 Other fatigue: Secondary | ICD-10-CM

## 2021-12-27 DIAGNOSIS — E611 Iron deficiency: Secondary | ICD-10-CM

## 2021-12-27 DIAGNOSIS — Z1231 Encounter for screening mammogram for malignant neoplasm of breast: Secondary | ICD-10-CM | POA: Diagnosis not present

## 2021-12-27 DIAGNOSIS — Z1211 Encounter for screening for malignant neoplasm of colon: Secondary | ICD-10-CM | POA: Diagnosis not present

## 2021-12-27 DIAGNOSIS — Z0000 Encounter for general adult medical examination without abnormal findings: Secondary | ICD-10-CM | POA: Diagnosis not present

## 2021-12-27 DIAGNOSIS — Z23 Encounter for immunization: Secondary | ICD-10-CM

## 2021-12-27 LAB — LIPID PANEL
Cholesterol: 174 mg/dL (ref 0–200)
HDL: 56.7 mg/dL (ref >39.00)
LDL Cholesterol: 104 mg/dL — ABNORMAL HIGH (ref 0–99)
NonHDL: 117.71
Total CHOL/HDL Ratio: 3
Triglycerides: 67 mg/dL (ref 0.0–149.0)
VLDL: 13.4 mg/dL (ref 0.0–40.0)

## 2021-12-27 LAB — COMPREHENSIVE METABOLIC PANEL
ALT: 16 U/L (ref 0–35)
AST: 18 U/L (ref 0–37)
Albumin: 3.9 g/dL (ref 3.5–5.2)
Alkaline Phosphatase: 113 U/L (ref 39–117)
BUN: 13 mg/dL (ref 6–23)
CO2: 27 mEq/L (ref 19–32)
Calcium: 9.1 mg/dL (ref 8.4–10.5)
Chloride: 105 mEq/L (ref 96–112)
Creatinine, Ser: 0.67 mg/dL (ref 0.40–1.20)
GFR: 95.97 mL/min (ref >60.00)
Glucose, Bld: 83 mg/dL (ref 70–99)
Potassium: 4.5 mEq/L (ref 3.5–5.1)
Sodium: 141 mEq/L (ref 135–145)
Total Bilirubin: 0.4 mg/dL (ref 0.2–1.2)
Total Protein: 6.3 g/dL (ref 6.0–8.3)

## 2021-12-27 LAB — CBC WITH DIFFERENTIAL/PLATELET
Basophils Absolute: 0.1 10*3/uL (ref 0.0–0.1)
Basophils Relative: 0.8 % (ref 0.0–3.0)
Eosinophils Absolute: 0.2 10*3/uL (ref 0.0–0.7)
Eosinophils Relative: 2.2 % (ref 0.0–5.0)
HCT: 41.1 % (ref 36.0–46.0)
Hemoglobin: 13.6 g/dL (ref 12.0–15.0)
Lymphocytes Relative: 32.6 % (ref 12.0–46.0)
Lymphs Abs: 2.3 10*3/uL (ref 0.7–4.0)
MCHC: 33.1 g/dL (ref 30.0–36.0)
MCV: 84 fl (ref 78.0–100.0)
Monocytes Absolute: 0.5 10*3/uL (ref 0.1–1.0)
Monocytes Relative: 7.4 % (ref 3.0–12.0)
Neutro Abs: 4 10*3/uL (ref 1.4–7.7)
Neutrophils Relative %: 57 % (ref 43.0–77.0)
Platelets: 273 10*3/uL (ref 150.0–400.0)
RBC: 4.89 Mil/uL (ref 3.87–5.11)
RDW: 13.9 % (ref 11.5–15.5)
WBC: 7 10*3/uL (ref 4.0–10.5)

## 2021-12-27 LAB — HEMOGLOBIN A1C: Hgb A1c MFr Bld: 5.8 % (ref 4.6–6.5)

## 2021-12-27 LAB — IRON: Iron: 106 ug/dL (ref 42–145)

## 2021-12-27 LAB — T4, FREE: Free T4: 0.93 ng/dL (ref 0.60–1.60)

## 2021-12-27 LAB — TSH: TSH: 0.88 u[IU]/mL (ref 0.35–5.50)

## 2021-12-27 LAB — VITAMIN B12: Vitamin B-12: >1500 pg/mL — ABNORMAL HIGH (ref 211–911)

## 2021-12-27 NOTE — Addendum Note (Signed)
Addended by: Sena Hitch on: 12/27/2021 09:07 AM   Modules accepted: Orders

## 2021-12-27 NOTE — Progress Notes (Signed)
Subjective:    Patient ID: Caroline Ward, female    DOB: Mar 19, 1960, 61 y.o.   MRN: [id number]  HPI Pt in for wellness exam. Works [employer]. Does not exercise regularly. Moderate healthy diet. Nonsmoker. Rare social drinker. Cut out caffeine  Pt fasting.  Will get flu vaccine. Pt considering getting shingrix. Not decided yet.  Pt has low iron. Recently when she was trying to give blood and they would not let her donate. No black or bloody stools.  Hx of bypass surgery so is on b12 supplament.  Mild fatigue.     Review of Systems  Constitutional:  Positive for fatigue. Negative for chills and fever.  HENT:  Negative for congestion, ear discharge and ear pain.   Respiratory:  Negative for cough, chest tightness and wheezing.   Cardiovascular:  Negative for chest pain and palpitations.  Gastrointestinal:  Negative for abdominal pain, blood in stool, diarrhea, nausea and vomiting.  Genitourinary:  Negative for decreased urine volume, difficulty urinating, dyspareunia, dysuria and frequency.  Musculoskeletal:  Negative for back pain, myalgias and neck stiffness.  Skin:  Negative for pallor and rash.  Neurological:  Negative for dizziness, seizures, speech difficulty, weakness, numbness and headaches.  Hematological:  Negative for adenopathy. Does not bruise/bleed easily.  Psychiatric/Behavioral:  Negative for behavioral problems and confusion.     Past Medical History:  Diagnosis Date   Anxiety    Asthma    on inhaler   Complication of anesthesia    Depression    GERD (gastroesophageal reflux disease)    Heart murmur    Hemorrhoids    in past   Migraine    Pneumonia    3 years ago/has bronchitis often   UTI (urinary tract infection)    in past     Social History   Socioeconomic History   Marital status: Single    Spouse name: Not on file   Number of children: 0   Years of education: Not on file   Highest education level: Not on file  Occupational History    Occupation: self employed-- Retail banker: [employer]  Tobacco Use   Smoking status: Never   Smokeless tobacco: Never  Substance and Sexual Activity   Alcohol use: No    Alcohol/week: 0.0 standard drinks of alcohol    Comment: Occ   Drug use: No   Sexual activity: Not Currently    Partners: Male  Other Topics Concern   Not on file  Social History Narrative   Exercise--no   Social Determinants of Health   Financial Resource Strain: Not on file  Food Insecurity: Not on file  Transportation Needs: Not on file  Physical Activity: Not on file  Stress: Not on file  Social Connections: Not on file  Intimate Partner Violence: Not on file    Past Surgical History:  Procedure Laterality Date   FRACTURE SURGERY     3rd metacarpal   GASTRIC ROUX-EN-Y N/A 06/30/2016   Procedure: LAPAROSCOPIC ROUX-EN-Y GASTRIC BYPASS WITH UPPER ENDOSCOPY;  Surgeon: Excell Seltzer, MD;  Location: [facility];  Service: General;  Laterality: N/A;   WISDOM TOOTH EXTRACTION      Family History  Problem Relation Age of Onset   Lung cancer Father    Alcohol abuse Father    Throat cancer Father    Hyperlipidemia Mother    COPD Mother    Hypertension Mother    Colon polyps Mother    Pancreatic cancer Brother  pancreas   Other Sister    Colon polyps Sister    Liver disease Maternal Aunt    Breast cancer Maternal Aunt    Dementia Maternal Aunt    Cancer Paternal Uncle        type unbknown   Other Brother        vein issues   Asthma Other    Diabetes Other     Allergies  Allergen Reactions   Myrbetriq [Mirabegron] Itching    Current Outpatient Medications on File Prior to Visit  Medication Sig Dispense Refill   acetaminophen (TYLENOL) 500 MG tablet Take 1,000 mg by mouth daily as needed for mild pain.      ALPRAZolam (XANAX) 0.25 MG tablet TAKE 1 TABLET(0.25 MG) BY MOUTH THREE TIMES DAILY AS NEEDED FOR ANXIETY 30 tablet 0   citalopram (CELEXA) 10 MG tablet Take 3  tablets (30 mg total) by mouth daily. 270 tablet 3   hydrocortisone (ANUSOL-HC) 2.5 % rectal cream Place 1 application rectally 2 (two) times daily. 30 g 2   Multiple Vitamin (MULTIVITAMIN) capsule Take 1 capsule by mouth daily.     Multiple Vitamins-Minerals (VITAMIN D3 COMPLETE PO) Take by mouth.     Thiamine HCl (VITAMIN B-1 PO) Take by mouth.     Turmeric 500 MG CAPS Take 1 capsule by mouth daily.     ALPRAZolam (XANAX) 0.5 MG tablet 1 tab prn daily panic attack. (Patient not taking: Reported on 12/27/2021) 10 tablet 0   Biotin 1000 MCG tablet Take by mouth. (Patient not taking: Reported on 12/27/2021)     CALCIUM PO Take 1 tablet by mouth 3 (three) times daily. (Patient not taking: Reported on 12/27/2021)     Cyanocobalamin (B-12 PO) Take 1 tablet by mouth daily. (Patient not taking: Reported on 12/27/2021)     No current facility-administered medications on file prior to visit.    BP 136/71 (BP Location: Left Arm, Patient Position: Sitting, Cuff Size: Large)   Pulse (!) 57   Temp 97.8 F (36.6 C) (Oral)   Resp 18   Ht '5\' 8"'$  (1.727 m)   Wt 246 lb 12.8 oz (111.9 kg)   LMP 04/29/2014   SpO2 100%   BMI 37.53 kg/m        Objective:   Physical Exam  General Mental Status- Alert. General Appearance- Not in acute distress.   Skin General: Color- Normal Color. Moisture- Normal Moisture.  Neck Carotid Arteries- Normal color. Moisture- Normal Moisture. No carotid bruits. No JVD.  Chest and Lung Exam Auscultation: Breath Sounds:-Normal.  Cardiovascular Auscultation:Rythm- Regular. Murmurs & Other Heart Sounds:Auscultation of the heart reveals- No Murmurs.  Abdomen Inspection:-Inspeection Normal. Palpation/Percussion:Note:No mass. Palpation and Percussion of the abdomen reveal- Non Tender, Non Distended + BS, no rebound or guarding.   Neurologic Cranial Nerve exam:- CN III-XII intact(No nystagmus), symmetric smile. Strength:- 5/5 equal and symmetric strength both upper  and lower extremities.       Assessment & Plan:   Patient Instructions  For you wellness exam today I have ordered cbc, cmp and  lipid panel.  Vaccine given today.  Flu vaccine. Think about getting shingrix. If decide can schedule nurse visit.  Recommend exercise and healthy diet.  We will let you know lab results as they come in.  Follow up date appointment will be determined after lab review.    For fatigue get b12, b1, tsh and t4.  For low iron get iron level.  Did place referral to GI MD.  Please call them as you may be overdue for colonsocopy.  Mammogram order placed. Also placed referral to gyn for pap.     Mackie Pai, PA-C

## 2021-12-27 NOTE — Patient Instructions (Addendum)
For you wellness exam today I have ordered cbc, cmp and  lipid panel.  Vaccine given today.  Flu vaccine. Think about getting shingrix. If decide can schedule nurse visit.  Recommend exercise and healthy diet.  We will let you know lab results as they come in.  Follow up date appointment will be determined after lab review.    For fatigue get b12, b1, tsh and t4.  For low iron get iron level.  Did place referral to GI MD. Please call them as you may be overdue for colonsocopy.  Mammogram order placed. Also placed referral to gyn for pap.   Preventive Care 25-41 Years Old, Female Preventive care refers to lifestyle choices and visits with your health care provider that can promote health and wellness. Preventive care visits are also called wellness exams. What can I expect for my preventive care visit? Counseling Your health care provider may ask you questions about your: Medical history, including: Past medical problems. Family medical history. Pregnancy history. Current health, including: Menstrual cycle. Method of birth control. Emotional well-being. Home life and relationship well-being. Sexual activity and sexual health. Lifestyle, including: Alcohol, nicotine or tobacco, and drug use. Access to firearms. Diet, exercise, and sleep habits. Work and work Statistician. Sunscreen use. Safety issues such as seatbelt and bike helmet use. Physical exam Your health care provider will check your: Height and weight. These may be used to calculate your BMI (body mass index). BMI is a measurement that tells if you are at a healthy weight. Waist circumference. This measures the distance around your waistline. This measurement also tells if you are at a healthy weight and may help predict your risk of certain diseases, such as type 2 diabetes and high blood pressure. Heart rate and blood pressure. Body temperature. Skin for abnormal spots. What immunizations do I  need?  Vaccines are usually given at various ages, according to a schedule. Your health care provider will recommend vaccines for you based on your age, medical history, and lifestyle or other factors, such as travel or where you work. What tests do I need? Screening Your health care provider may recommend screening tests for certain conditions. This may include: Lipid and cholesterol levels. Diabetes screening. This is done by checking your blood sugar (glucose) after you have not eaten for a while (fasting). Pelvic exam and Pap test. Hepatitis B test. Hepatitis C test. HIV (human immunodeficiency virus) test. STI (sexually transmitted infection) testing, if you are at risk. Lung cancer screening. Colorectal cancer screening. Mammogram. Talk with your health care provider about when you should start having regular mammograms. This may depend on whether you have a family history of breast cancer. BRCA-related cancer screening. This may be done if you have a family history of breast, ovarian, tubal, or peritoneal cancers. Bone density scan. This is done to screen for osteoporosis. Talk with your health care provider about your test results, treatment options, and if necessary, the need for more tests. Follow these instructions at home: Eating and drinking  Eat a diet that includes fresh fruits and vegetables, whole grains, lean protein, and low-fat dairy products. Take vitamin and mineral supplements as recommended by your health care provider. Do not drink alcohol if: Your health care provider tells you not to drink. You are pregnant, may be pregnant, or are planning to become pregnant. If you drink alcohol: Limit how much you have to 0-1 drink a day. Know how much alcohol is in your drink. In the [place] one drink  equals one 12 oz bottle of beer (355 mL), one 5 oz glass of wine (148 mL), or one 1 oz glass of hard liquor (44 mL). Lifestyle Brush your teeth every morning and night with  fluoride toothpaste. Floss one time each day. Exercise for at least 30 minutes 5 or more days each week. Do not use any products that contain nicotine or tobacco. These products include cigarettes, chewing tobacco, and vaping devices, such as e-cigarettes. If you need help quitting, ask your health care provider. Do not use drugs. If you are sexually active, practice safe sex. Use a condom or other form of protection to prevent STIs. If you do not wish to become pregnant, use a form of birth control. If you plan to become pregnant, see your health care provider for a prepregnancy visit. Take aspirin only as told by your health care provider. Make sure that you understand how much to take and what form to take. Work with your health care provider to find out whether it is safe and beneficial for you to take aspirin daily. Find healthy ways to manage stress, such as: Meditation, yoga, or listening to music. Journaling. Talking to a trusted person. Spending time with friends and family. Minimize exposure to UV radiation to reduce your risk of skin cancer. Safety Always wear your seat belt while driving or riding in a vehicle. Do not drive: If you have been drinking alcohol. Do not ride with someone who has been drinking. When you are tired or distracted. While texting. If you have been using any mind-altering substances or drugs. Wear a helmet and other protective equipment during sports activities. If you have firearms in your house, make sure you follow all gun safety procedures. Seek help if you have been physically or sexually abused. What's next? Visit your health care provider once a year for an annual wellness visit. Ask your health care provider how often you should have your eyes and teeth checked. Stay up to date on all vaccines. This information is not intended to replace advice given to you by your health care provider. Make sure you discuss any questions you have with your health  care provider. Document Revised: 06/27/2020 Document Reviewed: 06/27/2020 Elsevier Patient Education  [place].

## 2021-12-31 ENCOUNTER — Encounter: Payer: 59 | Admitting: Family

## 2022-01-01 LAB — VITAMIN B1: Vitamin B1 (Thiamine): 67 nmol/L — ABNORMAL HIGH (ref 8–30)

## 2022-01-08 ENCOUNTER — Encounter: Payer: Self-pay | Admitting: Medical

## 2022-01-08 ENCOUNTER — Telehealth (HOSPITAL_BASED_OUTPATIENT_CLINIC_OR_DEPARTMENT_OTHER): Payer: Self-pay

## 2022-01-09 ENCOUNTER — Encounter: Payer: 59 | Admitting: Family Medicine

## 2022-01-20 ENCOUNTER — Telehealth: Payer: Self-pay | Admitting: Family Medicine

## 2022-01-20 NOTE — Telephone Encounter (Signed)
Forms faxed

## 2022-01-20 NOTE — Telephone Encounter (Signed)
Patient called to advise that Caroline Ward was waiting for her lab work to come back so he could complete the insurance form that was provided and she requested the form to be emailed back to her ([email or web address]) but has not received it yet so she is following up. Please call to advise.

## 2022-02-06 ENCOUNTER — Other Ambulatory Visit: Payer: Self-pay | Admitting: Family Medicine

## 2022-02-06 DIAGNOSIS — Z1231 Encounter for screening mammogram for malignant neoplasm of breast: Secondary | ICD-10-CM

## 2022-02-07 ENCOUNTER — Ambulatory Visit
Admission: RE | Admit: 2022-02-07 | Discharge: 2022-02-07 | Disposition: A | Discharge status: Home / Self Care | Payer: Managed Care, Other (non HMO) | Source: Ambulatory Visit | Attending: Family Medicine | Admitting: Family Medicine

## 2022-02-07 DIAGNOSIS — Z1231 Encounter for screening mammogram for malignant neoplasm of breast: Secondary | ICD-10-CM

## 2022-02-27 ENCOUNTER — Ambulatory Visit: Payer: Managed Care, Other (non HMO) | Admitting: Student

## 2022-02-27 ENCOUNTER — Encounter: Payer: Self-pay | Admitting: Student

## 2022-02-27 VITALS — BP 121/78 | HR 62 | Ht 68.0 in | Wt 243.8 lb

## 2022-02-27 DIAGNOSIS — N952 Postmenopausal atrophic vaginitis: Secondary | ICD-10-CM

## 2022-02-27 DIAGNOSIS — Z7689 Persons encountering health services in other specified circumstances: Secondary | ICD-10-CM

## 2022-02-27 MED ORDER — ESTRADIOL 10 MCG VA TABS: 1.0000 | ORAL_TABLET | VAGINAL | 2 refills | Status: AC

## 2022-02-27 NOTE — Progress Notes (Signed)
Pt presents to establish care. Pt declines PAP today, will schedule for for December. Pt c/o vaginal dryness. No other concerns.

## 2022-02-27 NOTE — Progress Notes (Signed)
ANNUAL EXAM Patient name: Caroline Ward MRN [id number]  Date of birth: June 25, 1960 Chief Complaint:   Vaginal Dryness  History of Present Illness:   LAELANI DAUGHTRIDGE is a 62 y.o. G0P0000 Caucasian female being seen today for a routine annual exam.  Current complaints: Vaginal Dryness for 6 months. Patient has noticed burning and irritation that improved initially with OTC moisturizers, but did not provide long-term coverage throughout the day.  Patient's last menstrual period was 04/29/2014.   The pregnancy intention screening data noted above was reviewed. Potential methods of contraception were discussed. The patient elected to proceed with No data recorded.   Last pap 12/2019. Results were: NILM w/ HRHPV not done. H/O abnormal pap: no Last mammogram: 01/2022. Results were: normal. Family h/o breast cancer: no Last colonoscopy: 2016. Results were: normal. Family h/o colorectal cancer: no     12/27/2021    9:14 AM 01/08/2021    9:55 AM 03/03/2017    9:16 AM 10/21/2016   11:36 AM 05/15/2016    9:15 AM  Depression screen PHQ 2/9  Decreased Interest 0 0 0 0 0  Down, Depressed, Hopeless 1 0 0 0 0  PHQ - 2 Score 1 0 0 0 0        10/21/2016   11:36 AM  GAD 7 : Generalized Anxiety Score  Nervous, Anxious, on Edge 2  Control/stop worrying 1  Worry too much - different things 1  Trouble relaxing 3  Restless 3  Easily annoyed or irritable 3  Afraid - awful might happen 0  Total GAD 7 Score 13  Anxiety Difficulty Very difficult     Review of Systems:   Pertinent items are noted in HPI Denies any headaches, blurred vision, fatigue, shortness of breath, chest pain, abdominal pain, abnormal vaginal discharge/itching/odor/irritation, problems with periods, bowel movements, urination, or intercourse unless otherwise stated above. Pertinent History Reviewed:  Reviewed past medical,surgical, social and family history.  Reviewed problem list, medications and allergies. Physical  Assessment:   Vitals:   02/27/22 0819  BP: 121/78  Pulse: 62  Weight: 243 lb 12.8 oz (110.6 kg)  Height: 5' 8"$  (1.727 m)  Body mass index is 37.07 kg/m.        Physical Examination:   General appearance - well appearing, and in no distress  Mental status - alert, oriented to person, place, and time  Psych:  She has a normal mood and affect  Skin - warm and dry, normal color, no suspicious lesions noted  Chest - effort normal, all lung fields clear to auscultation bilaterally  Heart - normal rate and regular rhythm  Neck:  midline trachea, no thyromegaly or nodules  Breasts - breasts appear normal, no suspicious masses, no skin or nipple changes or   axillary node  Abdomen - soft, nontender, nondistended, no masses or organomegaly  Pelvic - Declined  Extremities:  No swelling or varicosities noted  Chaperone present for exam  No results found for this or any previous visit (from the past 24 hour(s)).  Assessment & Plan:  1. Encounter to establish care - Preventative care is up-to-date  - Recommend PAP later this year  2. Vaginal atrophy 3. Post-menopausal atrophic vaginitis - Discussed proper use and safety - Estradiol 10 MCG TABS vaginal tablet;  The insert is placed in the vagina daily for the first two weeks of use and then twice weekly thereafter  Dispense: 30 tablet; Refill: 2   Mammogram: in 1 year, or sooner if problems  Colonoscopy: per GI, or sooner if problems  No orders of the defined types were placed in this encounter.   Meds:  Meds ordered this encounter  Medications   Estradiol 10 MCG TABS vaginal tablet    Sig: Place 1 tablet (10 mcg total) vaginally 2 (two) times a week. The insert is placed in the vagina daily for the first two weeks of use and then twice weekly thereafter    Dispense:  30 tablet    Refill:  2    Order Specific Question:   Supervising Provider    Answer:   Donnamae Jude [id number]    Follow-up: Return in about 10 months (around  12/15/2022) for ANN; PAP.  Johnston Ebbs, NP 02/27/2022 8:44 AM

## 2022-02-27 NOTE — Patient Instructions (Signed)
Atrophic Vaginitis  Atrophic vaginitis is a condition in which the tissues that line the vagina become dry and thin. This condition is most common in women who have stopped having regular menstrual periods (are in menopause). This usually starts when a woman is 60 to 60 years old. That is the time when a woman's estrogen levels begin to decrease. Estrogen is a female hormone. It helps to keep the tissues of the vagina moist. It stimulates the vagina to produce a clear fluid that lubricates the vagina for sex. This fluid also protects the vagina from infection. Lack of estrogen can cause the lining of the vagina to get thinner and dryer. The vagina may also shrink in size. It may become less elastic. Atrophic vaginitis tends to get worse over time as a woman's estrogen level drops. What are the causes? This condition is caused by the normal drop in estrogen that happens around the time of menopause. What increases the risk? Certain conditions or situations may lower a woman's estrogen level, leading to a higher risk for atrophic vaginitis. You are more likely to develop this condition if: You are taking medicines that block estrogen. You have had your ovaries removed. You are being treated for cancer with radiation or medicines (chemotherapy). You have given birth or are breastfeeding. You are older than age 60. You smoke. What are the signs or symptoms? Symptoms of this condition include: Pain, soreness, a feeling of pressure, or bleeding during sex (dyspareunia). Vaginal burning, irritation, or itching. Pain or bleeding when a speculum is used in a vaginal exam. Having burning pain while urinating. Vaginal discharge. In some cases, there are no symptoms. How is this diagnosed? This condition is diagnosed based on your medical history and a physical exam. This will include a pelvic exam that checks the vaginal tissues. Though rare, you may also have other tests, including: A urine test. A  test that checks the acid balance in your vagina (acid balance test). How is this treated? Treatment for this condition depends on how severe your symptoms are. Treatment may include: Using an over-the-counter vaginal lubricant before sex. Using a long-acting vaginal moisturizer. Using low-dose estrogen for moderate to severe symptoms that do not respond to other treatments. Options include creams, tablets, and inserts (vaginal rings). Before you use a vaginal estrogen, tell your health care provider if you have a history of: Breast cancer. Endometrial cancer. Blood clots. If you are not sexually active and your symptoms are very mild, you may not need treatment. Follow these instructions at home: Medicines Take over-the-counter and prescription medicines only as told by your health care provider. Do not use herbal or alternative medicines unless your health care provider says that you can. Use over-the-counter creams, lubricants, or moisturizers for dryness only as told by your health care provider. General instructions If your atrophic vaginitis is caused by menopause, discuss all of your menopause symptoms and treatment options with your health care provider. Do not douche. Do not use products that can make your vagina dry. These include: Scented feminine sprays. Scented tampons. Scented soaps. Vaginal sex can help to improve blood flow and elasticity of vaginal tissue. If you choose to have sex and it hurts, try using a water-soluble lubricant or moisturizer right before having sex. Contact a health care provider if: Your discharge looks different than normal. Your vagina has an unusual smell. You have new symptoms. Your symptoms do not improve with treatment. Your symptoms get worse. Summary Atrophic vaginitis is a condition  in which the tissues that line the vagina become dry and thin. It is most common in women who have stopped having regular menstrual periods (are in  menopause). Treatment options include using vaginal lubricants and low-dose vaginal estrogen. Contact a health care provider if your vagina has an unusual smell, or if your symptoms get worse or do not improve after treatment. This information is not intended to replace advice given to you by your health care provider. Make sure you discuss any questions you have with your health care provider. Document Revised: 06/30/2019 Document Reviewed: 06/30/2019 Elsevier Patient Education  [place].

## 2022-03-29 ENCOUNTER — Other Ambulatory Visit: Payer: Self-pay | Admitting: Medical

## 2022-03-29 DIAGNOSIS — F419 Anxiety disorder, unspecified: Secondary | ICD-10-CM

## 2023-01-01 ENCOUNTER — Encounter: Payer: Managed Care, Other (non HMO) | Admitting: Family Medicine

## 2023-02-09 ENCOUNTER — Encounter (HOSPITAL_BASED_OUTPATIENT_CLINIC_OR_DEPARTMENT_OTHER): Payer: Self-pay | Admitting: Emergency Medicine

## 2023-02-09 ENCOUNTER — Ambulatory Visit: Payer: Self-pay | Admitting: Family Medicine

## 2023-02-09 ENCOUNTER — Emergency Department (HOSPITAL_BASED_OUTPATIENT_CLINIC_OR_DEPARTMENT_OTHER): Payer: Managed Care, Other (non HMO)

## 2023-02-09 ENCOUNTER — Other Ambulatory Visit: Payer: Self-pay

## 2023-02-09 ENCOUNTER — Emergency Department (HOSPITAL_BASED_OUTPATIENT_CLINIC_OR_DEPARTMENT_OTHER)
Admission: EM | Admit: 2023-02-09 | Discharge: 2023-02-09 | Disposition: A | Discharge status: Home / Self Care | Payer: Managed Care, Other (non HMO) | Attending: Emergency Medicine | Admitting: Emergency Medicine

## 2023-02-09 ENCOUNTER — Ambulatory Visit: Payer: Managed Care, Other (non HMO) | Admitting: Physician Assistant

## 2023-02-09 DIAGNOSIS — J45909 Unspecified asthma, uncomplicated: Secondary | ICD-10-CM | POA: Insufficient documentation

## 2023-02-09 DIAGNOSIS — R55 Syncope and collapse: Secondary | ICD-10-CM

## 2023-02-09 DIAGNOSIS — S01112A Laceration without foreign body of left eyelid and periocular area, initial encounter: Secondary | ICD-10-CM | POA: Diagnosis not present

## 2023-02-09 DIAGNOSIS — W182XXA Fall in (into) shower or empty bathtub, initial encounter: Secondary | ICD-10-CM | POA: Insufficient documentation

## 2023-02-09 DIAGNOSIS — J09X9 Influenza due to identified novel influenza A virus with other manifestations: Secondary | ICD-10-CM | POA: Insufficient documentation

## 2023-02-09 DIAGNOSIS — Z20822 Contact with and (suspected) exposure to covid-19: Secondary | ICD-10-CM | POA: Diagnosis not present

## 2023-02-09 DIAGNOSIS — S0990XA Unspecified injury of head, initial encounter: Secondary | ICD-10-CM | POA: Insufficient documentation

## 2023-02-09 DIAGNOSIS — J101 Influenza due to other identified influenza virus with other respiratory manifestations: Secondary | ICD-10-CM

## 2023-02-09 DIAGNOSIS — S81011A Laceration without foreign body, right knee, initial encounter: Secondary | ICD-10-CM | POA: Insufficient documentation

## 2023-02-09 LAB — CBC
HCT: 41.8 % (ref 36.0–46.0)
Hemoglobin: 13.6 g/dL (ref 12.0–15.0)
MCH: 26.5 pg (ref 26.0–34.0)
MCHC: 32.5 g/dL (ref 30.0–36.0)
MCV: 81.5 fL (ref 80.0–100.0)
Platelets: 233 10*3/uL (ref 150–400)
RBC: 5.13 MIL/uL — ABNORMAL HIGH (ref 3.87–5.11)
RDW: 17 % — ABNORMAL HIGH (ref 11.5–15.5)
WBC: 7.9 10*3/uL (ref 4.0–10.5)
nRBC: 0 % (ref 0.0–0.2)

## 2023-02-09 LAB — BASIC METABOLIC PANEL
Anion gap: 9 (ref 5–15)
BUN: 21 mg/dL — ABNORMAL HIGH (ref 6–20)
CO2: 18 mmol/L — ABNORMAL LOW (ref 22–32)
Calcium: 8.9 mg/dL (ref 8.9–10.3)
Chloride: 108 mmol/L (ref 98–111)
Creatinine, Ser: 0.85 mg/dL (ref 0.44–1.00)
GFR, Estimated: >60 mL/min (ref >60)
Glucose, Bld: 124 mg/dL — ABNORMAL HIGH (ref 70–99)
Potassium: 3.8 mmol/L (ref 3.5–5.1)
Sodium: 135 mmol/L (ref 135–145)

## 2023-02-09 LAB — RESP PANEL BY RT-PCR (RSV, FLU A&B, COVID)  RVPGX2
Influenza A by PCR: POSITIVE — AB
Influenza B by PCR: NEGATIVE
Resp Syncytial Virus by PCR: NEGATIVE
SARS Coronavirus 2 by RT PCR: NEGATIVE

## 2023-02-09 LAB — CBG MONITORING, ED: Glucose-Capillary: 123 mg/dL — ABNORMAL HIGH (ref 70–99)

## 2023-02-09 LAB — TROPONIN I (HIGH SENSITIVITY): Troponin I (High Sensitivity): 3 ng/L (ref <18)

## 2023-02-09 MED ORDER — SODIUM CHLORIDE 0.9 % IV BOLUS
500.0000 mL | Freq: Once | INTRAVENOUS | Status: AC
  Administered 2023-02-09: 500 mL via INTRAVENOUS

## 2023-02-09 MED ORDER — LIDOCAINE-EPINEPHRINE-TETRACAINE (LET) TOPICAL GEL
3.0000 mL | Freq: Once | TOPICAL | Status: DC
  Filled 2023-02-09: qty 3

## 2023-02-09 MED ORDER — LIDOCAINE-EPINEPHRINE (PF) 2 %-1:200000 IJ SOLN
10.0000 mL | Freq: Once | INTRAMUSCULAR | Status: AC
  Administered 2023-02-09: 10 mL
  Filled 2023-02-09: qty 20

## 2023-02-09 NOTE — Telephone Encounter (Signed)
  Chief Complaint: Fall Symptoms: small open area above left eyebrow, lightheaded Frequency: constant  Pertinent Negatives: Patient denies chest pain, fever, pain  Disposition: [] ED /[] Urgent Care (no appt availability in office) / [x] Appointment(In office/virtual)/ []  [facility]/ [] Home Care/ [] Refused Recommended Disposition /[] [facility]/ []  Follow-up with PCP Additional Notes: Patient states she fell in the shower this morning, she felt faint. She fell face first and has a small cut above the left eyebrow. Patient denies cutting bleeding from the cut. Patient states she thinks the shower was too hot cause her to pass out. Patient reports she was feeling lightheaded before going out. Care advice was given and patient has been scheduled for an appointment today at 1600. Advised if cut starts to bleed or if she feels like she is going to pass out to see care at urgent care and have someone drive her. Patient verbalized understanding.  Copied from CRM [id number]. Topic: Clinical - Red Word Triage >> Feb 09, 2023  8:56 AM Almira Coaster wrote: Red Word that prompted transfer to Nurse Triage: Patient fell this morning and hit her face, she states to have two black eyes, and possibly need stitches right above her eye brow. Headaches. Reason for Disposition  [1] MODERATE weakness (i.e., interferes with work, school, normal activities) AND [2] new-onset or worsening  Answer Assessment - Initial Assessment Questions 1. MECHANISM: "How did the fall happen?"     Fainted in the shower 2. DOMESTIC VIOLENCE AND ELDER ABUSE SCREENING: "Did you fall because someone pushed you or tried to hurt you?" If Yes, ask: "Are you safe now?"     No  3. ONSET: "When did the fall happen?" (e.g., minutes, hours, or days ago)     This morning  4. LOCATION: "What part of the body hit the ground?" (e.g., back, buttocks, head, hips, knees, hands, head, stomach)     Face first  5. INJURY: "Did you hurt (injure)  yourself when you fell?" If Yes, ask: "What did you injure? Tell me more about this?" (e.g., body area; type of injury; pain severity)"     Eyebrow is cut open left eye 6. PAIN: "Is there any pain?" If Yes, ask: "How bad is the pain?" (e.g., Scale 1-10; or mild,  moderate, severe)   - NONE (0): No pain   - MILD (1-3): Doesn't interfere with normal activities    - MODERATE (4-7): Interferes with normal activities or awakens from sleep    - SEVERE (8-10): Excruciating pain, unable to do any normal activities      Mild  7. SIZE: For cuts, bruises, or swelling, ask: "How large is it?" (e.g., inches or centimeters)      Open cut on the left eyebrow 8. PREGNANCY: "Is there any chance you are pregnant?" "When was your last menstrual period?"     No 9. OTHER SYMPTOMS: "Do you have any other symptoms?" (e.g., dizziness, fever, weakness; new onset or worsening).      Lightheaded  10. CAUSE: "What do you think caused the fall (or falling)?" (e.g., tripped, dizzy spell)       I felt sick like a sinus infection and I fainted in the shower  Protocols used: Falls and [facility]

## 2023-02-09 NOTE — Discharge Instructions (Signed)
You were seen in the emerged part after passing out.  Your lab work and imaging look normal.  I have repaired your lacerations and the sutures will need to be removed in 7 days.  You can start the antibiotic prescribed at urgent care for coverage of your laceration infection risk but you do have tested positive for flu a here which I suspect is causing many of your symptoms.  Please get plenty of rest, drink fluids, follow-up with your PCP.

## 2023-02-09 NOTE — ED Provider Notes (Signed)
Emergency Department Provider Note   I have reviewed the triage vital signs and the nursing notes.   HISTORY  Chief Complaint Facial Laceration, Fall, and Loss of Consciousness   HPI Caroline Ward is a 63 y.o. female with PMH reviewed presents to the ED with pain after syncope and fall in the shower this morning.  She just returned from urgent care where she was diagnosed with flulike symptoms.  She was discharged home on an antibiotic which she has yet to start but went home to take a shower.  While in the shower she felt lightheaded and had a brief syncope episode falling and striking her face on the doorknob and right knee on the ground.  She had bleeding but no vision change or severe headache.  No chest pain, palpitations, shortness of breath.  She is not anticoagulated.   Past Medical History:  Diagnosis Date   Anxiety    Asthma    on inhaler   Complication of anesthesia    Depression    GERD (gastroesophageal reflux disease)    Heart murmur    Hemorrhoids    in past   Migraine    Pneumonia    3 years ago/has bronchitis often   UTI (urinary tract infection)    in past    Review of Systems  Constitutional: No fever/chills Cardiovascular: Denies chest pain. Positive syncope.  Respiratory: Denies shortness of breath. Gastrointestinal: No abdominal pain.  No nausea, no vomiting.  No diarrhea.  No constipation. Genitourinary: Negative for dysuria. Musculoskeletal: Negative for back pain. Skin: Positive face and right knee laceration.  Neurological: Negative for headaches, focal weakness or numbness.   ____________________________________________   PHYSICAL EXAM:  VITAL SIGNS: ED Triage Vitals  Encounter Vitals Group     BP 02/09/23 1142 107/76     Pulse Rate 02/09/23 1142 89     Resp 02/09/23 1142 15     Temp 02/09/23 1142 98.2 F (36.8 C)     Temp src --      SpO2 02/09/23 1142 93 %     Weight 02/09/23 1142 260 lb (117.9 kg)     Height 02/09/23 1142  5\' 8"  (1.727 m)   Constitutional: Alert and oriented. Well appearing and in no acute distress. Eyes: Conjunctivae are normal. PERRL. EOMI. Head: Deep left eyebrow laceration through the dermis. No open fracture. Wound is hemostatic.  Nose: No congestion/rhinnorhea. Mouth/Throat: Mucous membranes are moist.   Neck: No stridor. No cervical spine tenderness to palpation. Cardiovascular: Normal rate, regular rhythm. Good peripheral circulation. Grossly normal heart sounds.   Respiratory: Normal respiratory effort.  No retractions. Lungs CTAB. Gastrointestinal: Soft and nontender. No distention.  Musculoskeletal: No lower extremity tenderness nor edema. No gross deformities of extremities. Normal ROM of the right knee. 5 cm laceration to the anterior right knee. Laceration is superficial without joint space involvement.  Neurologic:  Normal speech and language. No gross focal neurologic deficits are appreciated.  Skin:  Skin is warm, dry and intact. No rash noted.  ____________________________________________   LABS (all labs ordered are listed, but only abnormal results are displayed)  Labs Reviewed  RESP PANEL BY RT-PCR (RSV, FLU A&B, COVID)  RVPGX2 - Abnormal; Notable for the following components:      Result Value   Influenza A by PCR POSITIVE (*)    All other components within normal limits  BASIC METABOLIC PANEL - Abnormal; Notable for the following components:   CO2 18 (*)  Glucose, Bld 124 (*)    BUN 21 (*)    All other components within normal limits  CBC - Abnormal; Notable for the following components:   RBC 5.13 (*)    RDW 17.0 (*)    All other components within normal limits  CBG MONITORING, ED - Abnormal; Notable for the following components:   Glucose-Capillary 123 (*)    All other components within normal limits  URINALYSIS, ROUTINE W REFLEX MICROSCOPIC  TROPONIN I (HIGH SENSITIVITY)  TROPONIN I (HIGH SENSITIVITY)    ____________________________________________  EKG   EKG Interpretation Date/Time:  Monday February 09 2023 11:50:46 EST Ventricular Rate:  84 PR Interval:  178 QRS Duration:  99 QT Interval:  450 QTC Calculation: 532 R Axis:   104  Text Interpretation: Sinus rhythm Probable left atrial enlargement Right axis deviation Minimal ST depression, inferior leads Similar to 2018 tracing Confirmed by Alona Bene [phone]) on 02/09/2023 2:44:00 PM        ____________________________________________  RADIOLOGY  DG Knee Complete 4 Views Right Result Date: 02/09/2023 CLINICAL DATA:  Fall, knee pain EXAM: RIGHT KNEE - COMPLETE 4+ VIEW COMPARISON:  None Available. FINDINGS: Soft tissue laceration noted anteriorly over the proximal tibia. No acute bony abnormality. Specifically, no fracture, subluxation, or dislocation. No joint effusion. IMPRESSION: No acute bony abnormality. Electronically Signed   By: Charlett Nose M.D.   On: 02/09/2023 12:48   CT Head Wo Contrast Result Date: 02/09/2023 CLINICAL DATA:  Head trauma, moderate-severe; Facial trauma, blunt. Syncopal episode in shower. Left eyebrow laceration. EXAM: CT HEAD WITHOUT CONTRAST CT MAXILLOFACIAL WITHOUT CONTRAST TECHNIQUE: Multidetector CT imaging of the head and maxillofacial structures were performed using the standard protocol without intravenous contrast. Multiplanar CT image reconstructions of the maxillofacial structures were also generated. RADIATION DOSE REDUCTION: This exam was performed according to the departmental dose-optimization program which includes automated exposure control, adjustment of the mA and/or kV according to patient size and/or use of iterative reconstruction technique. COMPARISON:  Sinus CT 06/16/2013 FINDINGS: CT HEAD FINDINGS Brain: There is no evidence of an acute infarct, intracranial hemorrhage, mass, midline shift, or extra-axial fluid collection. Cerebral and is normal. The ventricles are normal in size. A  partially empty sella is noted. Vascular: No hyperdense vessel. Skull: No acute fracture or suspicious osseous lesion. Other: None. CT MAXILLOFACIAL FINDINGS Osseous: No acute fracture, mandibular dislocation, or suspicious osseous lesion. Orbits: Intact globes.  No retrobulbar hematoma. Sinuses: Circumferential mucosal thickening in the left greater than right maxillary sinuses. Bubbly fluid/secretions in the left maxillary greater than right sphenoid sinuses. Mild left greater than right frontal and ethmoid sinus mucosal thickening. Clear mastoid air cells and middle ear cavities. Soft tissues: Left eyebrow laceration. IMPRESSION: 1. No evidence of acute intracranial abnormality or maxillofacial fracture. 2. Left eyebrow laceration. 3. Sinusitis. Electronically Signed   By: Sebastian Ache M.D.   On: 02/09/2023 12:46   CT Maxillofacial Wo Contrast Result Date: 02/09/2023 CLINICAL DATA:  Head trauma, moderate-severe; Facial trauma, blunt. Syncopal episode in shower. Left eyebrow laceration. EXAM: CT HEAD WITHOUT CONTRAST CT MAXILLOFACIAL WITHOUT CONTRAST TECHNIQUE: Multidetector CT imaging of the head and maxillofacial structures were performed using the standard protocol without intravenous contrast. Multiplanar CT image reconstructions of the maxillofacial structures were also generated. RADIATION DOSE REDUCTION: This exam was performed according to the departmental dose-optimization program which includes automated exposure control, adjustment of the mA and/or kV according to patient size and/or use of iterative reconstruction technique. COMPARISON:  Sinus CT 06/16/2013 FINDINGS: CT HEAD  FINDINGS Brain: There is no evidence of an acute infarct, intracranial hemorrhage, mass, midline shift, or extra-axial fluid collection. Cerebral and is normal. The ventricles are normal in size. A partially empty sella is noted. Vascular: No hyperdense vessel. Skull: No acute fracture or suspicious osseous lesion. Other: None.  CT MAXILLOFACIAL FINDINGS Osseous: No acute fracture, mandibular dislocation, or suspicious osseous lesion. Orbits: Intact globes.  No retrobulbar hematoma. Sinuses: Circumferential mucosal thickening in the left greater than right maxillary sinuses. Bubbly fluid/secretions in the left maxillary greater than right sphenoid sinuses. Mild left greater than right frontal and ethmoid sinus mucosal thickening. Clear mastoid air cells and middle ear cavities. Soft tissues: Left eyebrow laceration. IMPRESSION: 1. No evidence of acute intracranial abnormality or maxillofacial fracture. 2. Left eyebrow laceration. 3. Sinusitis. Electronically Signed   By: Sebastian Ache M.D.   On: 02/09/2023 12:46    ____________________________________________   PROCEDURES  Procedure(s) performed:   .Laceration Repair  Date/Time: 02/09/2023 2:44 PM  Performed by: Maia Plan, MD Authorized by: Maia Plan, MD   Consent:    Consent obtained:  Verbal   Consent given by:  Patient   Risks, benefits, and alternatives were discussed: yes     Risks discussed:  Infection, need for additional repair, nerve damage, pain, poor cosmetic result, poor wound healing, retained foreign body and vascular damage   Alternatives discussed:  No treatment Universal protocol:    Patient identity confirmed:  Verbally with patient Anesthesia:    Anesthesia method:  Local infiltration   Local anesthetic:  Lidocaine 1% WITH epi Laceration details:    Location:  Face   Face location:  L eyebrow   Length (cm):  6   Depth (mm):  7 Pre-procedure details:    Preparation:  Imaging obtained to evaluate for foreign bodies and patient was prepped and draped in usual sterile fashion Exploration:    Limited defect created (wound extended): no     Hemostasis achieved with:  Direct pressure   Imaging obtained comment:  CT max/face   Imaging outcome: foreign body not noted     Wound exploration: wound explored through full range of motion  and entire depth of wound visualized     Wound extent: areolar tissue not violated, fascia not violated, no foreign body, no signs of injury, no nerve damage, no tendon damage, no underlying fracture and no vascular damage     Contaminated: no   Treatment:    Area cleansed with:  Shur-Clens   Amount of cleaning:  Standard   Irrigation solution:  Sterile saline   Irrigation method:  Pressure wash Skin repair:    Repair method:  Sutures   Suture size:  4-0   Suture material:  Prolene   Suture technique:  Simple interrupted   Number of sutures:  7 Approximation:    Approximation:  Close Repair type:    Repair type:  Intermediate Post-procedure details:    Dressing:  Open (no dressing)   Procedure completion:  Tolerated well, no immediate complications .Laceration Repair  Date/Time: 02/09/2023 2:45 PM  Performed by: Maia Plan, MD Authorized by: Maia Plan, MD   Consent:    Consent obtained:  Verbal   Consent given by:  Patient   Risks, benefits, and alternatives were discussed: yes     Risks discussed:  Infection, need for additional repair, nerve damage, pain, poor wound healing, retained foreign body, tendon damage, poor cosmetic result and vascular damage   Alternatives discussed:  No  treatment Universal protocol:    Patient identity confirmed:  Verbally with patient Anesthesia:    Anesthesia method:  Local infiltration   Local anesthetic:  Lidocaine 1% WITH epi Laceration details:    Location:  Leg   Leg location:  R knee   Length (cm):  5   Depth (mm):  6 Pre-procedure details:    Preparation:  Imaging obtained to evaluate for foreign bodies and patient was prepped and draped in usual sterile fashion Exploration:    Limited defect created (wound extended): no     Hemostasis achieved with:  Direct pressure   Imaging obtained: x-ray     Imaging outcome: foreign body not noted     Wound exploration: wound explored through full range of motion and entire depth  of wound visualized     Wound extent: areolar tissue not violated, fascia not violated, no foreign body, no signs of injury, no nerve damage, no tendon damage, no underlying fracture and no vascular damage     Contaminated: no   Treatment:    Area cleansed with:  Shur-Clens and saline   Amount of cleaning:  Standard   Irrigation solution:  Sterile saline   Irrigation method:  Pressure wash Skin repair:    Repair method:  Sutures   Suture size:  3-0   Suture technique:  Simple interrupted   Number of sutures:  5 Approximation:    Approximation:  Close Repair type:    Repair type:  Simple Post-procedure details:    Dressing:  Bulky dressing   Procedure completion:  Tolerated well, no immediate complications  _____________   INITIAL IMPRESSION / ASSESSMENT AND PLAN / ED COURSE  Pertinent labs & imaging results that were available during my care of the patient were reviewed by me and considered in my medical decision making (see chart for details).   This patient is Presenting for Evaluation of syncope/fall, which does require a range of treatment options, and is a complaint that involves a high risk of morbidity and mortality.  The Differential Diagnoses includes subdural hematoma, epidural hematoma, acute concussion, traumatic subarachnoid hemorrhage, cerebral contusions, etc.   Critical Interventions-    Medications  sodium chloride 0.9 % bolus 500 mL (0 mLs Intravenous Stopped 02/09/23 1413)  lidocaine-EPINEPHrine (XYLOCAINE W/EPI) 2 %-1:200000 (PF) injection 10 mL (10 mLs Infiltration Given by Other 02/09/23 1202)    Reassessment after intervention: symptoms improved after IVF.  Clinical Laboratory Tests Ordered, included flu a positive.  Potassium normal.  No leukocytosis.  Troponin normal.  Radiologic Tests Ordered, included CT head, max face, and knee XR. I independently interpreted the images and agree with radiology interpretation.   Cardiac Monitor Tracing which  shows NSR. No ectopy.   Medical Decision Making: Summary:  Patient presents emergency department with syncope and fall in the shower.  Seems vasovagal related with lightheadedness with bending over.  Patient diagnosed with flu, likely also contributing to symptoms.  She is feeling improved after IV fluids.  She does have a deep laceration to the left face and eyebrow which I repaired at bedside after cleaning.  Right knee laceration also repaired.  CT imaging with no underlying fracture or bleeding.  The knee laceration is superficial and I am able to visualize the entire depth of the wound.  I do not appreciate any evidence of deeper space involvement or joint space involvement.  Reevaluation with update and discussion with patient. Plan for ED return for suture removal.   Patient's presentation is most  consistent with acute presentation with potential threat to life or bodily function.   Disposition: discharge  ____________________________________________  FINAL CLINICAL IMPRESSION(S) / ED DIAGNOSES  Final diagnoses:  Vasovagal syncope  Injury of head, initial encounter  Influenza A  Laceration of right knee, initial encounter  Laceration of left eyebrow, initial encounter    Note:  This document was prepared using Dragon voice recognition software and may include unintentional dictation errors.  Alona Bene, MD, [facility]    Mckensi Redinger, Arlyss Repress, MD 02/09/23 [phone]

## 2023-02-09 NOTE — ED Triage Notes (Signed)
Pt POV steady gait-  reports syncopal episode while in shower appx 0530 today.  Pt with open lac to L eyebrow, bleeding controlled at this time.   Also reports R knee pain and laceration.   Reports recent URI.  Prescribed antibiotics from UC today for cough.   Denies blood thinners.   AOx4. Tylenol taken appx 1030

## 2023-05-03 ENCOUNTER — Other Ambulatory Visit: Payer: Self-pay | Admitting: Family Medicine

## 2023-05-03 DIAGNOSIS — F419 Anxiety disorder, unspecified: Secondary | ICD-10-CM

## 2023-05-22 ENCOUNTER — Ambulatory Visit (INDEPENDENT_AMBULATORY_CARE_PROVIDER_SITE_OTHER): Admitting: Family Medicine

## 2023-05-22 ENCOUNTER — Encounter: Payer: Self-pay | Admitting: Family Medicine

## 2023-05-22 VITALS — BP 110/78 | HR 58 | Temp 97.7°F | Resp 16 | Ht 68.0 in | Wt 246.6 lb

## 2023-05-22 DIAGNOSIS — F419 Anxiety disorder, unspecified: Secondary | ICD-10-CM | POA: Diagnosis not present

## 2023-05-22 DIAGNOSIS — G43701 Chronic migraine without aura, not intractable, with status migrainosus: Secondary | ICD-10-CM

## 2023-05-22 DIAGNOSIS — S0990XD Unspecified injury of head, subsequent encounter: Secondary | ICD-10-CM

## 2023-05-22 LAB — COMPREHENSIVE METABOLIC PANEL WITH GFR
AG Ratio: 1.7 (calc) (ref 1.0–2.5)
ALT: 17 U/L (ref 6–29)
AST: 19 U/L (ref 10–35)
Albumin: 4 g/dL (ref 3.6–5.1)
Alkaline phosphatase (APISO): 101 U/L (ref 37–153)
BUN: 19 mg/dL (ref 7–25)
CO2: 28 mmol/L (ref 20–32)
Calcium: 8.9 mg/dL (ref 8.6–10.4)
Chloride: 105 mmol/L (ref 98–110)
Creat: 0.6 mg/dL (ref 0.50–1.05)
Globulin: 2.4 g/dL (ref 1.9–3.7)
Glucose, Bld: 81 mg/dL (ref 65–99)
Potassium: 4.8 mmol/L (ref 3.5–5.3)
Sodium: 138 mmol/L (ref 135–146)
Total Bilirubin: 0.3 mg/dL (ref 0.2–1.2)
Total Protein: 6.4 g/dL (ref 6.1–8.1)
eGFR: 103 mL/min/{1.73_m2} (ref >=60)

## 2023-05-22 LAB — CBC WITH DIFFERENTIAL/PLATELET
Absolute Lymphocytes: 2664 {cells}/uL (ref 850–3900)
Absolute Monocytes: 584 {cells}/uL (ref 200–950)
Basophils Absolute: 48 {cells}/uL (ref 0–200)
Basophils Relative: 0.6 %
Eosinophils Absolute: 264 {cells}/uL (ref 15–500)
Eosinophils Relative: 3.3 %
HCT: 39.9 % (ref 35.0–45.0)
Hemoglobin: 13.2 g/dL (ref 11.7–15.5)
MCH: 28.2 pg (ref 27.0–33.0)
MCHC: 33.1 g/dL (ref 32.0–36.0)
MCV: 85.3 fL (ref 80.0–100.0)
MPV: 11 fL (ref 7.5–12.5)
Monocytes Relative: 7.3 %
Neutro Abs: 4440 {cells}/uL (ref 1500–7800)
Neutrophils Relative %: 55.5 %
Platelets: 291 10*3/uL (ref 140–400)
RBC: 4.68 10*6/uL (ref 3.80–5.10)
RDW: 13.1 % (ref 11.0–15.0)
Total Lymphocyte: 33.3 %
WBC: 8 10*3/uL (ref 3.8–10.8)

## 2023-05-22 LAB — SEDIMENTATION RATE: Sed Rate: 9 mm/h (ref 0–30)

## 2023-05-22 MED ORDER — KETOROLAC TROMETHAMINE 60 MG/2ML IM SOLN
60.0000 mg | Freq: Once | INTRAMUSCULAR | Status: AC
  Administered 2023-05-22: 60 mg via INTRAMUSCULAR

## 2023-05-22 MED ORDER — NURTEC 75 MG PO TBDP: ORAL_TABLET | ORAL | 2 refills | Status: DC

## 2023-05-22 MED ORDER — SUMATRIPTAN SUCCINATE 50 MG PO TABS: 50.0000 mg | ORAL_TABLET | ORAL | 0 refills | Status: AC | PRN

## 2023-05-22 MED ORDER — CITALOPRAM HYDROBROMIDE 10 MG PO TABS: 30.0000 mg | ORAL_TABLET | Freq: Every day | ORAL | 0 refills | Status: DC

## 2023-05-22 NOTE — Progress Notes (Signed)
 Established Patient Office Visit  Subjective   Patient ID: Caroline Ward, female    DOB: [birth year]  Age: 63 y.o. MRN: [id number]  Chief Complaint  Patient presents with   Migraine    Pt states having headaches daily. Pt states sxs worsening since fall in January     HPI Discussed the use of AI scribe software for clinical note transcription with the patient, who gave verbal consent to proceed.  History of Present Illness Caroline Ward is a 63 year old female who presents with persistent headaches following head trauma.  She has been experiencing persistent headaches since a fall on February 09, 2023. During the fall, she became lightheaded and hit her head, resulting in a laceration near her eye that required stitches. A CT scan performed in the ER was normal. The headaches occur daily and have been worsening over time.  The headaches are centered around the area of the initial injury. She has a history of migraines but notes these headaches differ as they are unpredictable. Tylenol  provides insufficient relief, as she rarely goes four hours without needing another dose.  No vision changes are noted, but she feels nauseous with the headaches, though there is no vomiting. She previously took medication for migraines during college but does not recall the specific medication. Currently, she is not on any daily preventive medication for headaches.   Patient Active Problem List   Diagnosis Date Noted   Elbow injury, right, initial encounter 06/26/2017   Left knee pain 01/29/2017   Morbid obesity with BMI of 40.0-44.9, adult (HCC) 06/30/2016   Bilateral carpal tunnel syndrome 01/13/2016   Severe obesity (BMI >= 40) (HCC) 06/26/2015   Epicondylitis, lateral 12/04/2014   HTN (hypertension) 03/07/2014   Dizzy 03/07/2014   Dyspnea 10/26/2013   Weight gain 07/12/2013   Cough variant asthma 06/13/2013   Upper airway cough syndrome 03/18/2013   Obesity (BMI 30-39.9) 02/17/2013    NEVI, MULTIPLE 08/30/2008   COMMON MIGRAINE 08/15/2008   PLANTAR FASCIITIS, RIGHT 08/15/2008   GUAIAC POSITIVE STOOL 09/21/2007   SEBACEOUS CYST 03/03/2007   Anxiety state 06/10/2006   DEPRESSION 06/10/2006   DYSMENORRHEA 06/10/2006   Past Medical History:  Diagnosis Date   Anxiety    Asthma    on inhaler   Complication of anesthesia    Depression    GERD (gastroesophageal reflux disease)    Heart murmur    Hemorrhoids    in past   Migraine    Pneumonia    3 years ago/has bronchitis often   UTI (urinary tract infection)    in past   Past Surgical History:  Procedure Laterality Date   FRACTURE SURGERY     3rd metacarpal   GASTRIC ROUX-EN-Y N/A 06/30/2016   Procedure: LAPAROSCOPIC ROUX-EN-Y GASTRIC BYPASS WITH UPPER ENDOSCOPY;  Surgeon: Ayesha Lente, MD;  Location: [facility];  Service: General;  Laterality: N/A;   WISDOM TOOTH EXTRACTION     Social History   Tobacco Use   Smoking status: Never   Smokeless tobacco: Never  Substance Use Topics   Alcohol use: No    Alcohol/week: 0.0 standard drinks of alcohol    Comment: Occ   Drug use: No   Social History   Socioeconomic History   Marital status: Single    Spouse name: Not on file   Number of children: 0   Years of education: Not on file   Highest education level: Not on file  Occupational History   Occupation:  self employed-- Public librarian: [employer]  Tobacco Use   Smoking status: Never   Smokeless tobacco: Never  Substance and Sexual Activity   Alcohol use: No    Alcohol/week: 0.0 standard drinks of alcohol    Comment: Occ   Drug use: No   Sexual activity: Not Currently    Partners: Male  Other Topics Concern   Not on file  Social History Narrative   Exercise--no   Social Drivers of Health   Financial Resource Strain: Not on file  Food Insecurity: Not on file  Transportation Needs: Not on file  Physical Activity: Not on file  Stress: Not on file  Social Connections: Not  on file  Intimate Partner Violence: Not on file   Family Status  Relation Name Status   Father  Deceased at age 24   Mother  Alive   Brother larry Deceased at age 36       pancreatic   Brother wayne Other       mva   Sister sandy Alive   Brother randy Deceased at age 74       circulation issues   Mat Aunt  (Not Specified)   Youth worker  (Not Specified)   Nutritional therapist  (Not Specified)   Brother  (Not Specified)   Other  (Not Specified)  No partnership data on file   Family History  Problem Relation Age of Onset   Lung cancer Father    Alcohol abuse Father    Throat cancer Father    Hyperlipidemia Mother    COPD Mother    Hypertension Mother    Colon polyps Mother    Pancreatic cancer Brother        pancreas   Other Sister    Colon polyps Sister    Liver disease Maternal Aunt    Breast cancer Maternal Aunt    Dementia Maternal Aunt    Cancer Paternal Uncle        type unbknown   Other Brother        vein issues   Asthma Other    Diabetes Other    Allergies  Allergen Reactions   Myrbetriq  [Mirabegron ] Itching      Review of Systems  Constitutional:  Negative for fever and malaise/fatigue.  HENT:  Negative for congestion.   Eyes:  Negative for blurred vision.  Respiratory:  Negative for cough and shortness of breath.   Cardiovascular:  Negative for chest pain, palpitations and leg swelling.  Gastrointestinal:  Negative for abdominal pain, blood in stool, nausea and vomiting.  Genitourinary:  Negative for dysuria and frequency.  Musculoskeletal:  Negative for back pain and falls.  Skin:  Negative for rash.  Neurological:  Positive for headaches. Negative for dizziness and loss of consciousness.  Endo/Heme/Allergies:  Negative for environmental allergies.  Psychiatric/Behavioral:  Negative for depression. The patient is not nervous/anxious.       Objective:     BP 110/78 (BP Location: Left Arm, Patient Position: Sitting, Cuff Size: Large)   Pulse (!) 58    Temp 97.7 F (36.5 C) (Oral)   Resp 16   Ht 5\' 8"  (1.727 m)   Wt 246 lb 9.6 oz (111.9 kg)   LMP 04/29/2014   SpO2 97%   BMI 37.50 kg/m  BP Readings from Last 3 Encounters:  05/22/23 110/78  02/09/23 118/82  02/27/22 121/78   Wt Readings from Last 3 Encounters:  05/22/23 246 lb 9.6 oz (111.9 kg)  02/09/23 260 lb (117.9 kg)  02/27/22 243 lb 12.8 oz (110.6 kg)   SpO2 Readings from Last 3 Encounters:  05/22/23 97%  02/09/23 96%  12/27/21 100%    Physical Exam Vitals and nursing note reviewed.  Constitutional:      General: She is not in acute distress.    Appearance: Normal appearance. She is well-developed.  HENT:     Head: Normocephalic and atraumatic.  Eyes:     General: No scleral icterus.       Right eye: No discharge.        Left eye: No discharge.  Cardiovascular:     Rate and Rhythm: Normal rate and regular rhythm.     Heart sounds: No murmur heard. Pulmonary:     Effort: Pulmonary effort is normal. No respiratory distress.     Breath sounds: Normal breath sounds.  Musculoskeletal:        General: Normal range of motion.     Cervical back: Normal range of motion and neck supple.     Right lower leg: No edema.     Left lower leg: No edema.  Skin:    General: Skin is warm and dry.  Neurological:     Mental Status: She is alert and oriented to person, place, and time.     Cranial Nerves: Cranial nerves 2-12 are intact.     Motor: No weakness, tremor, atrophy, abnormal muscle tone, seizure activity or pronator drift.     Coordination: Coordination is intact. Romberg sign negative. Coordination normal. Finger-Nose-Finger Test and Heel to [facility] Test normal. Rapid alternating movements normal.     Gait: Gait is intact.  Psychiatric:        Mood and Affect: Mood normal.        Behavior: Behavior normal.        Thought Content: Thought content normal.        Judgment: Judgment normal.      No results found for any visits on 05/22/23.  Last CBC Lab Results   Component Value Date   WBC 7.9 02/09/2023   HGB 13.6 02/09/2023   HCT 41.8 02/09/2023   MCV 81.5 02/09/2023   MCH 26.5 02/09/2023   RDW 17.0 (H) 02/09/2023   PLT 233 02/09/2023   Last metabolic panel Lab Results  Component Value Date   GLUCOSE 124 (H) 02/09/2023   NA 135 02/09/2023   K 3.8 02/09/2023   CL 108 02/09/2023   CO2 18 (L) 02/09/2023   BUN 21 (H) 02/09/2023   CREATININE 0.85 02/09/2023   GFRNONAA >60 02/09/2023   CALCIUM 8.9 02/09/2023   PROT 6.3 12/27/2021   ALBUMIN 3.9 12/27/2021   BILITOT 0.4 12/27/2021   ALKPHOS 113 12/27/2021   AST 18 12/27/2021   ALT 16 12/27/2021   ANIONGAP 9 02/09/2023   Last lipids Lab Results  Component Value Date   CHOL 174 12/27/2021   HDL 56.70 12/27/2021   LDLCALC 104 (H) 12/27/2021   LDLDIRECT 144.8 05/19/2011   TRIG 67.0 12/27/2021   CHOLHDL 3 12/27/2021   Last hemoglobin A1c Lab Results  Component Value Date   HGBA1C 5.8 12/27/2021   Last thyroid  functions Lab Results  Component Value Date   TSH 0.88 12/27/2021   Last vitamin D  Lab Results  Component Value Date   VD25OH 35.43 12/17/2018   Last vitamin B12 and Folate Lab Results  Component Value Date   VITAMINB12 >1500 (H) 12/27/2021   FOLATE 10.6 06/16/2017  The 10-year ASCVD risk score (Arnett DK, et al., 2019) is: 2.2%    Assessment & Plan:   Problem List Items Addressed This Visit   None Visit Diagnoses       Traumatic injury of head, subsequent encounter    -  Primary   Relevant Orders   MR Brain Wo Contrast     Anxiety       Relevant Medications   citalopram  (CELEXA ) 10 MG tablet     Chronic migraine without aura with status migrainosus, not intractable       Relevant Medications   citalopram  (CELEXA ) 10 MG tablet   Rimegepant Sulfate (NURTEC) 75 MG TBDP   SUMAtriptan (IMITREX) 50 MG tablet   ketorolac (TORADOL) injection 60 mg (Completed)   Other Relevant Orders   CBC with Differential/Platelet   Comprehensive metabolic panel  with GFR   Sedimentation rate     Assessment and Plan Assessment & Plan Headache due to head trauma   Chronic daily headaches have persisted since head trauma in January, worsening and localized to the previous injury site. There are no vision changes, and a previous CT scan was normal. The differential includes post-traumatic headache and possible nerve-related pain. An MRI of the brain is indicated for further evaluation. These headaches differ from her migraines and occur more frequently. She is open to daily medication due to her frequency. Order an MRI of the brain at [facility] on [address]. Prescribe preventive medication every other day. Prescribe Imitrex at headache onset, with a repeat dose in two hours if needed, starting with a lower dose and adjusting as necessary. Administer a Toradol injection for acute headache relief. Allow Tylenol  as needed for additional pain relief.  Nausea associated with headache   Nausea accompanies the headaches but is not severe enough for antiemetic treatment. There is no vomiting. Monitor nausea symptoms and consider antiemetic treatment if symptoms worsen.    Return if symptoms worsen or fail to improve.    Anayia Eugene R Lowne Chase, DO

## 2023-05-22 NOTE — Patient Instructions (Signed)

## 2023-05-25 ENCOUNTER — Ambulatory Visit
Admission: RE | Admit: 2023-05-25 | Discharge: 2023-05-25 | Disposition: A | Discharge status: Home / Self Care | Source: Ambulatory Visit | Attending: Family Medicine | Admitting: Family Medicine

## 2023-05-25 DIAGNOSIS — S0990XD Unspecified injury of head, subsequent encounter: Secondary | ICD-10-CM

## 2023-05-26 ENCOUNTER — Other Ambulatory Visit: Payer: Self-pay | Admitting: Family Medicine

## 2023-05-26 ENCOUNTER — Ambulatory Visit: Payer: Self-pay | Admitting: Family Medicine

## 2023-05-26 MED ORDER — AMITRIPTYLINE HCL 10 MG PO TABS: 10.0000 mg | ORAL_TABLET | Freq: Every day | ORAL | 4 refills | Status: DC

## 2023-06-16 ENCOUNTER — Other Ambulatory Visit: Payer: Self-pay | Admitting: Family Medicine

## 2023-06-16 DIAGNOSIS — F411 Generalized anxiety disorder: Secondary | ICD-10-CM

## 2023-06-16 MED ORDER — ALPRAZOLAM 0.25 MG PO TABS: ORAL_TABLET | ORAL | 0 refills | Status: DC

## 2023-06-16 NOTE — Telephone Encounter (Signed)
 Refills request for alprazolam , has not been refilled since 2021, her last visit was 05/22/23 however was for a traumatic head injury and she does not have another upcoming appt. Please advise ?

## 2023-06-16 NOTE — Telephone Encounter (Signed)
 Copied from CRM [id number]. Topic: Clinical - Medication Refill >> Jun 16, 2023 12:08 PM [place] E wrote: Medication: ALPRAZolam  (XANAX ) 0.25 MG tablet  Has the patient contacted their pharmacy? Yes (Agent: If no, request that the patient contact the pharmacy for the refill. If patient does not wish to contact the pharmacy document the reason why and proceed with request.) (Agent: If yes, when and what did the pharmacy advise?)  This is the patient's preferred pharmacy:  [id number] - [place], [place] - [address] AT [facility] [address] [place] [address] Phone: [phone] Fax: [phone]  Is this the correct pharmacy for this prescription? Yes If no, delete pharmacy and type the correct one.   Has the prescription been filled recently? No  Is the patient out of the medication? No, 5 days left.  Has the patient been seen for an appointment in the last year OR does the patient have an upcoming appointment? Yes  Can we respond through MyChart? Yes  Agent: Please be advised that Rx refills may take up to 3 business days. We ask that you follow-up with your pharmacy.

## 2023-08-26 ENCOUNTER — Other Ambulatory Visit: Payer: Self-pay | Admitting: Family Medicine

## 2023-08-26 DIAGNOSIS — F411 Generalized anxiety disorder: Secondary | ICD-10-CM

## 2023-08-27 NOTE — Telephone Encounter (Signed)
 Requesting: Xanax  Contract: n/a UDS: n/a Last OV: 05/22/23 Next OV: n/a Last Refill: 06/16/2023, #30--0 RF Database:   Please advise

## 2023-11-16 ENCOUNTER — Ambulatory Visit: Payer: Self-pay

## 2023-11-16 NOTE — Telephone Encounter (Signed)
 FYI - patient has appt with Edward Saguier, PA-C tomorrow to address concerns.

## 2023-11-16 NOTE — Telephone Encounter (Signed)
 FYI Only or Action Required?: FYI only for provider: appointment scheduled on 11/04.  Patient was last seen in primary care on 05/22/2023 by Antonio Meth, Jamee SAUNDERS, DO.  Called Nurse Triage reporting Arm Injury.  Symptoms began several weeks ago.  Interventions attempted: OTC medications: Tylenol , ace bandace wrap, splint.  Symptoms are: gradually worsening.  Triage Disposition: See Physician Within 24 Hours  Patient/caregiver understands and will follow disposition?: Yes  Reason for Disposition  [1] MODERATE pain (e.g., interferes with normal activities) AND [2] high-risk adult (e.g., age > 60 years, osteoporosis, chronic steroid use)  Answer Assessment - Initial Assessment Questions 1. MECHANISM: How did the injury happen?     [place] to first aid tent 2. ONSET: When did the injury happen? (e.g., minutes, hours ago)      2 weeks ago 3. LOCATION: Where is the injury located? Which arm?     Left forearm 4. APPEARANCE of INJURY: What does the injury look like?      Back to normal appearance, but discomfort when rotating and trying to straighten 5. SEVERITY: Can you use the arm normally?      Patient states when trying to straighten arm or turn hand, she has a lot of pain 6. SWELLING or BRUISING: is there any swelling or bruising? If Yes, ask: How large is it? (e.g., inches, centimeters)      Patient states it was swollen for first 2 days 7. PAIN: Is there pain? If Yes, ask: How bad is the pain? (Scale 0-10; or none, mild, moderate, severe)     Patient states she is keeping it wrapped and braced so pain is controlled 8. TETANUS: For any breaks in the skin, ask: When was your last tetanus booster?     Patient states she had scrape on knee 9. OTHER SYMPTOMS: Do you have any other symptoms?  (e.g., numbness in hand)     Weakness is setting in  Protocols used: Arm Injury-A-AH

## 2023-11-17 ENCOUNTER — Ambulatory Visit: Admitting: Medical

## 2023-11-17 ENCOUNTER — Other Ambulatory Visit: Payer: Self-pay | Admitting: Medical

## 2023-11-17 ENCOUNTER — Ambulatory Visit (HOSPITAL_BASED_OUTPATIENT_CLINIC_OR_DEPARTMENT_OTHER)
Admission: RE | Admit: 2023-11-17 | Discharge: 2023-11-17 | Disposition: A | Discharge status: Home / Self Care | Source: Ambulatory Visit | Attending: Medical | Admitting: Medical

## 2023-11-17 ENCOUNTER — Ambulatory Visit: Payer: Self-pay | Admitting: Medical

## 2023-11-17 DIAGNOSIS — M25522 Pain in left elbow: Secondary | ICD-10-CM | POA: Diagnosis present

## 2023-11-17 DIAGNOSIS — M25532 Pain in left wrist: Secondary | ICD-10-CM | POA: Insufficient documentation

## 2023-11-17 DIAGNOSIS — Z23 Encounter for immunization: Secondary | ICD-10-CM

## 2023-11-17 MED ORDER — AMITRIPTYLINE HCL 10 MG PO TABS: 10.0000 mg | ORAL_TABLET | Freq: Every day | ORAL | 4 refills | Status: DC

## 2023-11-17 NOTE — Progress Notes (Signed)
   Subjective:    Patient ID: Grayce DELENA [place], female    DOB: June 28, 1960, 63 y.o.   MRN: [id number]  HPI  CLYDEAN POSAS is a 63 year old female who presents with worsening left forearm pain following a fall two weeks ago.  Two weeks ago, she experienced a fall while walking, tripping on a sidewalk and using her left arm to catch herself. EMS at the festival she was attending wrapped her arm, but she has not been formally evaluated by a healthcare provider since the incident.  The pain has been worsening, particularly in the medial aspect of the elbow and forearm. It is exacerbated by certain movements, especially overextending or turning her arm. Initially, the pain was more severe in the inner elbow, preventing her from keeping her arm straight, and she managed the discomfort by walking with her hand in her pocket.  She has been using a wrist cock-up splint and an ACE wrap, which she finds provides better range of motion. The pain intensifies during activities such as typing or holding onto stair rails. Swelling was noted on the second and third days post-injury, but no significant bruising, only a small impact bruise. She has not taken any medication for pain relief.  The pain is more pronounced during the day, particularly when typing, although she manages by keeping her hand stationary. The pain does not radiate to other areas and is primarily localized to the forearm and elbow. No pain in the back of the elbow and no significant bruising. The pain is not present when moving her arm up and down, but is present when turning her arm.  Review of Systems See hpi    Objective:   Physical Exam  General- No acute distress. Pleasant patient. Neck- Full range of motion, no jvd Lungs- Clear, even and unlabored. Heart- regular rate and rhythm. Neurologic- CNII- XII grossly intact.  Left upper ext Tenderness over the scaphoid, medial forearm, and medial elbow.     Assessment & Plan:   Patient  Instructions  Left forearm, wrist, and elbow pain after fall Pain localized to medial elbow, distal radius, and wrist with mild tenderness over scaphoid. Differential includes muscle contusion, tendon, ligament injury, or fractures (scaphoid fracture.?)- Ordered x-rays of wrist, forearm, and elbow with fast read. - Referred to sports medicine for scaphoid tenderness evaluation. - Consider MRI of scaphoid if pain persists and x-ray is negative. - Advised use of thumb spica brace for scaphoid pain, if feasible for typing. - Schedule follow-up with sports medicine by Thursday but after saw xray report trying to arrange appt for wed. -Tylenol  for pain  Follow up date to be determined after x-ray review and sport med referral.

## 2023-11-17 NOTE — Patient Instructions (Addendum)
 Left forearm, wrist, and elbow pain after fall Pain localized to medial elbow, distal radius, and wrist with mild tenderness over scaphoid. Differential includes muscle contusion, tendon, ligament injury, or fractures (scaphoid fracture.?)- Ordered x-rays of wrist, forearm, and elbow with fast read. - Referred to sports medicine for scaphoid tenderness evaluation. - Consider MRI of scaphoid if pain persists and x-ray is negative. - Advised use of thumb spica brace for scaphoid pain, if feasible for typing. - Schedule follow-up with sports medicine by Thursday but after saw xray report describing fx trying to arrange appt for wed. -Tylenol  for pain  Follow up date to be determined after x-ray review and sport med referral.

## 2023-11-18 ENCOUNTER — Ambulatory Visit: Admitting: Internal Medicine

## 2023-11-18 ENCOUNTER — Encounter: Payer: Self-pay | Admitting: Internal Medicine

## 2023-11-18 VITALS — BP 127/71 | Ht 68.0 in | Wt 250.0 lb

## 2023-11-18 DIAGNOSIS — S52135A Nondisplaced fracture of neck of left radius, initial encounter for closed fracture: Secondary | ICD-10-CM

## 2023-11-18 NOTE — Progress Notes (Cosign Needed)
 PCP: Antonio Cyndee Jamee JONELLE, DO  Patient is a 63 y.o. female here for left elbow pain.  She stepped off of a curb inadvertently 2 weeks ago, falling forward.  She braced herself with her left arm and reports acute onset of left lateral elbow pain at that time.  She describes swelling for the first 2-3 days with limited range of motion due to pain.  Range of motion has improved, swelling resolved, but she continued to experience significant discomfort.  This led her to see her PCP yesterday, who ordered x-rays of the left wrist and forearm, which revealed a nondisplaced fracture through the neck of the radial head.  She was referred to sports medicine for further evaluation and management.  Today she continues to endorse discomfort along the left lateral elbow.  Range of motion is improving but she experiences discomfort with terminal extension and flexion.  Denies pain in the left elbow but has mild discomfort along the dorsal left wrist.  Past Medical History:  Diagnosis Date   Anxiety    Asthma    on inhaler   Complication of anesthesia    Depression    GERD (gastroesophageal reflux disease)    Heart murmur    Hemorrhoids    in past   Migraine    Pneumonia    3 years ago/has bronchitis often   UTI (urinary tract infection)    in past    Current Outpatient Medications on File Prior to Visit  Medication Sig Dispense Refill   acetaminophen  (TYLENOL ) 500 MG tablet Take 1,000 mg by mouth daily as needed for mild pain.      ALPRAZolam  (XANAX ) 0.25 MG tablet TAKE 1 TABLET(0.25 MG) BY MOUTH THREE TIMES DAILY AS NEEDED FOR ANXIETY 30 tablet 0   ALPRAZolam  (XANAX ) 0.5 MG tablet 1 tab prn daily panic attack. (Patient not taking: Reported on 11/17/2023) 10 tablet 0   amitriptyline  (ELAVIL ) 10 MG tablet Take 1 tablet (10 mg total) by mouth at bedtime. 30 tablet 4   Biotin 1000 MCG tablet Take by mouth. (Patient not taking: Reported on 11/17/2023)     CALCIUM PO Take 1 tablet by mouth 3 (three)  times daily. (Patient not taking: Reported on 11/17/2023)     citalopram  (CELEXA ) 10 MG tablet Take 3 tablets (30 mg total) by mouth daily. Pt needs office visit for further refills 270 tablet 0   Cyanocobalamin  (B-12 PO) Take 1 tablet by mouth daily. (Patient not taking: Reported on 11/17/2023)     Estradiol  10 MCG TABS vaginal tablet Place 1 tablet (10 mcg total) vaginally 2 (two) times a week. The insert is placed in the vagina daily for the first two weeks of use and then twice weekly thereafter 30 tablet 2   hydrocortisone  (ANUSOL -HC) 2.5 % rectal cream Place 1 application rectally 2 (two) times daily. 30 g 2   Multiple Vitamin (MULTIVITAMIN) capsule Take 1 capsule by mouth daily.     Multiple Vitamins-Minerals (VITAMIN D3 COMPLETE PO) Take by mouth.     SUMAtriptan  (IMITREX ) 50 MG tablet Take 1 tablet (50 mg total) by mouth every 2 (two) hours as needed for migraine. May repeat in 2 hours if headache persists or recurs. 10 tablet 0   Thiamine  HCl (VITAMIN B-1 PO) Take by mouth.     Turmeric 500 MG CAPS Take 1 capsule by mouth daily.     No current facility-administered medications on file prior to visit.    Past Surgical History:  Procedure  Laterality Date   FRACTURE SURGERY     3rd metacarpal   GASTRIC ROUX-EN-Y N/A 06/30/2016   Procedure: LAPAROSCOPIC ROUX-EN-Y GASTRIC BYPASS WITH UPPER ENDOSCOPY;  Surgeon: Mikell Katz, MD;  Location: [facility];  Service: General;  Laterality: N/A;   WISDOM TOOTH EXTRACTION      Allergies  Allergen Reactions   Myrbetriq  [Mirabegron ] Itching    BP 127/71   Ht 5' 8 (1.727 m)   Wt 250 lb (113.4 kg)   LMP 04/29/2014   BMI 38.01 kg/m       No data to display              No data to display              Objective:  Physical Exam:  Gen: NAD, comfortable in exam room  Left elbow No obvious deformity on inspection ROM from 5-115 degrees of flexion.  There is pain with extension approaching 5 degrees.   TTP along the  antecubital and lateral aspects of the left elbow.  No TTP over the lateral epicondyle. Strength not tested in the setting of known fracture. Grossly NV intact  2 view x-rays left forearm (11/17/23) IMPRESSION: 1. Acute nondisplaced transverse fracture through the neck of the radial head. 2. Small elbow joint effusion. 3. Soft tissue swelling surrounding the wrist.  3 view x-rays left wrist (11/17/2023) No acute fracture or dislocation.  Bones are mildly osteopenic.   Assessment and Plan:  Nondisplaced left radial neck fracture Injury occurred 2 weeks ago.  No surgical indication or need for casting.  We discussed focusing on range of motion exercises to prevent stiffness.  She should focus on basic range of motion exercises for the next 2 weeks and then attempt light weighted exercises.  We will tentatively plan for follow-up in 4 weeks, however if she is not experiencing any discomfort and range of motion is full she can cancel this appointment.

## 2023-11-18 NOTE — Patient Instructions (Signed)
 It was a pleasure to see you today.  Thank you for giving us  the opportunity to be involved in your care.  Below is a brief recap of your visit and next steps.  We will plan to see you again in 4 weeks.  Summary You have a fracture of the radius in your right forearm. As we discussed, there is no surgical indication or need for casting. Please focus on the range of motion exercises you were given today. OK to begin weighted exercises after another 2 weeks. We will tentatively plan for follow up in 4 weeks but you can cancel this appointment if you are not experiencing any ongoing discomfort or compromised range of motion.

## 2023-11-19 ENCOUNTER — Ambulatory Visit

## 2023-12-03 ENCOUNTER — Other Ambulatory Visit: Payer: Self-pay | Admitting: Family Medicine

## 2023-12-03 ENCOUNTER — Encounter: Payer: Self-pay | Admitting: Family Medicine

## 2023-12-03 DIAGNOSIS — G43701 Chronic migraine without aura, not intractable, with status migrainosus: Secondary | ICD-10-CM

## 2023-12-03 DIAGNOSIS — F419 Anxiety disorder, unspecified: Secondary | ICD-10-CM

## 2023-12-03 MED ORDER — AMITRIPTYLINE HCL 10 MG PO TABS: 10.0000 mg | ORAL_TABLET | Freq: Every day | ORAL | 3 refills | Status: DC

## 2023-12-03 MED ORDER — AMITRIPTYLINE HCL 10 MG PO TABS: 10.0000 mg | ORAL_TABLET | Freq: Every day | ORAL | 3 refills | Status: AC

## 2023-12-03 MED ORDER — CITALOPRAM HYDROBROMIDE 10 MG PO TABS: 30.0000 mg | ORAL_TABLET | Freq: Every day | ORAL | 0 refills | Status: AC

## 2023-12-16 ENCOUNTER — Ambulatory Visit: Admitting: Internal Medicine
